# Patient Record
Sex: Female | Born: 1965 | Race: White | Hispanic: No | Marital: Married | Smoking: Current every day smoker
Health system: Southern US, Community
[De-identification: ages and names within clinical notes are randomized; demographics above are authoritative.]

## PROBLEM LIST (undated history)

## (undated) DIAGNOSIS — K760 Fatty (change of) liver, not elsewhere classified: Secondary | ICD-10-CM

## (undated) DIAGNOSIS — F32A Depression, unspecified: Secondary | ICD-10-CM

## (undated) DIAGNOSIS — E785 Hyperlipidemia, unspecified: Secondary | ICD-10-CM

## (undated) DIAGNOSIS — K219 Gastro-esophageal reflux disease without esophagitis: Secondary | ICD-10-CM

## (undated) DIAGNOSIS — I82629 Acute embolism and thrombosis of deep veins of unspecified upper extremity: Secondary | ICD-10-CM

## (undated) DIAGNOSIS — I10 Essential (primary) hypertension: Secondary | ICD-10-CM

## (undated) DIAGNOSIS — K8689 Other specified diseases of pancreas: Secondary | ICD-10-CM

## (undated) DIAGNOSIS — R32 Unspecified urinary incontinence: Secondary | ICD-10-CM

## (undated) DIAGNOSIS — M19049 Primary osteoarthritis, unspecified hand: Secondary | ICD-10-CM

## (undated) DIAGNOSIS — A630 Anogenital (venereal) warts: Secondary | ICD-10-CM

## (undated) DIAGNOSIS — Z973 Presence of spectacles and contact lenses: Secondary | ICD-10-CM

## (undated) DIAGNOSIS — F419 Anxiety disorder, unspecified: Secondary | ICD-10-CM

## (undated) DIAGNOSIS — Z01419 Encounter for gynecological examination (general) (routine) without abnormal findings: Secondary | ICD-10-CM

## (undated) DIAGNOSIS — C801 Malignant (primary) neoplasm, unspecified: Secondary | ICD-10-CM

## (undated) DIAGNOSIS — K861 Other chronic pancreatitis: Secondary | ICD-10-CM

## (undated) DIAGNOSIS — F329 Major depressive disorder, single episode, unspecified: Secondary | ICD-10-CM

## (undated) HISTORY — DX: Anogenital (venereal) warts: A63.0

## (undated) HISTORY — DX: Primary osteoarthritis, unspecified hand: M19.049

## (undated) HISTORY — DX: Other specified diseases of pancreas: K86.89

## (undated) HISTORY — DX: Hyperlipidemia, unspecified: E78.5

## (undated) HISTORY — DX: Gastro-esophageal reflux disease without esophagitis: K21.9

## (undated) HISTORY — DX: Other chronic pancreatitis: K86.1

## (undated) HISTORY — DX: Unspecified urinary incontinence: R32

## (undated) HISTORY — DX: Fatty (change of) liver, not elsewhere classified: K76.0

## (undated) HISTORY — DX: Major depressive disorder, single episode, unspecified: F32.9

## (undated) HISTORY — DX: Anxiety disorder, unspecified: F41.9

## (undated) HISTORY — DX: Depression, unspecified: F32.A

## (undated) HISTORY — DX: Presence of spectacles and contact lenses: Z97.3

## (undated) HISTORY — DX: Encounter for gynecological examination (general) (routine) without abnormal findings: Z01.419

## (undated) HISTORY — DX: Acute embolism and thrombosis of deep veins of unspecified upper extremity: I82.629

## (undated) HISTORY — DX: Essential (primary) hypertension: I10

## (undated) HISTORY — DX: Malignant (primary) neoplasm, unspecified: C80.1

## (undated) HISTORY — PX: UPPER GASTROINTESTINAL ENDOSCOPY: SHX188

---
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
## 1985-05-14 HISTORY — PX: ABDOMINAL HYSTERECTOMY: SHX81

## 1998-05-14 HISTORY — PX: CHOLECYSTECTOMY: SHX55

## 2008-07-02 ENCOUNTER — Encounter: Admission: RE | Admit: 2008-07-02 | Discharge: 2008-07-02 | Payer: Self-pay | Admitting: Emergency Medicine

## 2008-07-08 ENCOUNTER — Encounter: Admission: RE | Admit: 2008-07-08 | Discharge: 2008-07-08 | Payer: Self-pay | Admitting: Emergency Medicine

## 2009-04-22 ENCOUNTER — Emergency Department (HOSPITAL_COMMUNITY): Admission: EM | Admit: 2009-04-22 | Discharge: 2009-04-22 | Payer: Self-pay | Admitting: Emergency Medicine

## 2009-05-12 HISTORY — PX: UPPER ENDOSCOPIC ULTRASOUND W/ FNA: SHX2601

## 2009-05-14 DIAGNOSIS — K8689 Other specified diseases of pancreas: Secondary | ICD-10-CM

## 2009-05-14 DIAGNOSIS — I82629 Acute embolism and thrombosis of deep veins of unspecified upper extremity: Secondary | ICD-10-CM

## 2009-05-14 HISTORY — PX: PANCREATICODUODENECTOMY: SUR1000

## 2009-05-14 HISTORY — DX: Acute embolism and thrombosis of deep veins of unspecified upper extremity: I82.629

## 2009-05-14 HISTORY — DX: Other specified diseases of pancreas: K86.89

## 2009-06-10 HISTORY — PX: PANCREATICODUODENECTOMY: SUR1000

## 2009-07-04 HISTORY — PX: OTHER SURGICAL HISTORY: SHX169

## 2009-11-25 ENCOUNTER — Other Ambulatory Visit: Admission: RE | Admit: 2009-11-25 | Discharge: 2009-11-25 | Payer: Self-pay | Admitting: Obstetrics and Gynecology

## 2010-06-04 ENCOUNTER — Encounter: Payer: Self-pay | Admitting: Emergency Medicine

## 2010-06-26 ENCOUNTER — Ambulatory Visit: Payer: Self-pay | Admitting: Internal Medicine

## 2010-08-15 LAB — COMPREHENSIVE METABOLIC PANEL
Albumin: 4.1 g/dL (ref 3.5–5.2)
BUN: 6 mg/dL (ref 6–23)
Chloride: 103 mEq/L (ref 96–112)
Creatinine, Ser: 0.81 mg/dL (ref 0.4–1.2)
Glucose, Bld: 86 mg/dL (ref 70–99)
Total Bilirubin: 0.7 mg/dL (ref 0.3–1.2)

## 2010-08-15 LAB — DIFFERENTIAL
Basophils Absolute: 0 10*3/uL (ref 0.0–0.1)
Basophils Relative: 0 % (ref 0–1)
Monocytes Relative: 6 % (ref 3–12)
Neutro Abs: 7.2 10*3/uL (ref 1.7–7.7)
Neutrophils Relative %: 64 % (ref 43–77)

## 2010-08-15 LAB — CBC
HCT: 42.3 % (ref 36.0–46.0)
Hemoglobin: 14.1 g/dL (ref 12.0–15.0)
MCHC: 33.2 g/dL (ref 30.0–36.0)
RBC: 4.73 MIL/uL (ref 3.87–5.11)
RDW: 13.8 % (ref 11.5–15.5)

## 2010-08-15 LAB — URINALYSIS, ROUTINE W REFLEX MICROSCOPIC
Bilirubin Urine: NEGATIVE
Glucose, UA: NEGATIVE mg/dL
Ketones, ur: NEGATIVE mg/dL
pH: 6.5 (ref 5.0–8.0)

## 2010-08-15 LAB — LIPASE, BLOOD: Lipase: 24 U/L (ref 11–59)

## 2010-10-05 ENCOUNTER — Encounter: Payer: Self-pay | Admitting: Medical

## 2010-10-05 ENCOUNTER — Ambulatory Visit (INDEPENDENT_AMBULATORY_CARE_PROVIDER_SITE_OTHER): Payer: BC Managed Care – PPO | Admitting: Medical

## 2010-10-05 VITALS — BP 130/84 | HR 60 | Temp 98.2°F | Ht 65.5 in | Wt 144.0 lb

## 2010-10-05 DIAGNOSIS — K861 Other chronic pancreatitis: Secondary | ICD-10-CM

## 2010-10-05 DIAGNOSIS — E119 Type 2 diabetes mellitus without complications: Secondary | ICD-10-CM

## 2010-10-05 DIAGNOSIS — R110 Nausea: Secondary | ICD-10-CM

## 2010-10-05 DIAGNOSIS — R197 Diarrhea, unspecified: Secondary | ICD-10-CM

## 2010-10-05 DIAGNOSIS — R109 Unspecified abdominal pain: Secondary | ICD-10-CM

## 2010-10-05 NOTE — Progress Notes (Signed)
Subjective:   HPI  Sara [facility] is a 45 y.o. female who presents to establish care. She is a former patient of mine. She has a somewhat complicated history over the last year. January of last year she had a pancreatic mass, ultimately had Whipple surgery, and the mass was found to be benign. Subsequently she ended up having new diagnosis of diabetes. She was very determined to get things under control, has made lifestyle changes, and is not on any medication at this time for diabetes, hypertension, or hyperlipidemia.  About 3 weeks ago [facility] for severe abdominal pain, and was diagnosed with chronic pancreatitis. Her surgeon advised her to followup with her primary doctor to discuss management of chronic pancreatitis. She is here for this reason today. Lately she has been having nausea, abdominal bloating, diarrhea, worse after eating, and seemed to have symptoms both morning and evening. Lately she has been intolerant to milk products. Her stool has been loose in general, no mucous or blood. She is very healthy: Including steamed vegetables, chicken, salmon, oatmeal.  Avoids bread in general, fast food, junkfood.  Prior to having her Whipple surgery last year, she has seen a gastroenterologist in [place] for an upper endoscopy. Otherwise no recent followup with a gastroenterologist.    She would like her hemoglobin A1c checked today. She was checking her glucose somewhat regularly with normal readings, but has lost her glucometer. No other c/o.  The following portions of the patient's history were reviewed and updated as appropriate: allergies, current medications, past family history, past medical history, past social history, past surgical history and problem list.  Past Medical History  Diagnosis Date  . Hypertension     Previous medication, but now controlled with weight loss and lifestyle change  . Anxiety   . Diabetes mellitus 10/2008    Was  on medication briefly a diagnosis, however with lifestyle changes, blood sugar back to normal  . Hyperlipidemia     Diet-controlled, lifestyle controlled  . GERD (gastroesophageal reflux disease)   . Depression   . Pancreatic mass 1/ 2011    Whipple surgery; lesion found to be benign  . History of thrombosis 05/2009    History of venous embolism and thrombosis of upper extremity at PICC line     Review of Systems Constitutional: denies fever, chills, sweats, unexpected weight change, anorexia, fatigue ENT: no runny nose, ear pain, sore throat, hoarseness, sinus pain Cardiology: denies chest pain, palpitations, edema Respiratory: denies cough, shortness of breath, wheezing Gastroenterology: denies vomiting, constipation,  Hematology: denies bleeding or bruising problems Musculoskeletal: denies arthralgias, myalgias, joint swelling, back pain Urology: denies dysuria, difficulty urinating, hematuria, urinary frequency, urgency    Objective:   Physical Exam  General appearence: alert, no distress, WD/WN, white female HEENT: normocephalic, sclerae anicteric, PERRLA, EOMi, nares patent, no discharge or erythema, pharynx normal Oral cavity: MMM, no lesions Neck: supple, no lymphadenopathy, no thyromegaly, no masses Heart: RRR, normal S1, S2, no murmurs Lungs: CTA bilaterally, no wheezes, rhonchi, or rales Abdomen: Vertical surgical scar, +bs, soft, non tender, non distended, no masses, no hepatomegaly, no splenomegaly Back: non tender Musculoskeletal: nontender, no swelling, no obvious deformity Extremities: no edema, no cyanosis, no clubbing Pulses: 2+ symmetric, upper and lower extremities, normal cap refill   Assessment :    Encounter Diagnoses  Name Primary?  . Diarrhea Yes  . Nausea   . Abdominal pain   . Chronic pancreatitis   . Diabetes mellitus  Plan:    She is a former patient of mine, she developed a new diagnosis of diabetes shortly after recovering from  her Whipple surgery January 2011. She was very determined to beat this diagnosis, started exercising, eating very healthy, and losing weight. She is now maintaining her diet and exercise and lifestyle changes for almost a year, and is now diet controlled. She is no longer on medication for, nor does she have symptoms or criteria for hypertension, diabetes, or hyperlipidemia of recent.  Discussed the nature of chronic pancreatitis, complications, symptoms, various treatments. Discussed diet. She will continue her multivitamins. She signed records release today so we can obtain prior records. Once we receive the records, will consider referral to gastroenterology for consult. For now avoid food triggers.

## 2010-10-12 ENCOUNTER — Other Ambulatory Visit (INDEPENDENT_AMBULATORY_CARE_PROVIDER_SITE_OTHER): Payer: BC Managed Care – PPO | Admitting: Medical

## 2010-10-12 ENCOUNTER — Telehealth: Payer: Self-pay | Admitting: *Deleted

## 2010-10-12 ENCOUNTER — Other Ambulatory Visit: Payer: BC Managed Care – PPO

## 2010-10-12 ENCOUNTER — Other Ambulatory Visit: Payer: Self-pay | Admitting: Medical

## 2010-10-12 ENCOUNTER — Encounter: Payer: Self-pay | Admitting: Gastroenterology

## 2010-10-12 DIAGNOSIS — R197 Diarrhea, unspecified: Secondary | ICD-10-CM

## 2010-10-12 DIAGNOSIS — N390 Urinary tract infection, site not specified: Secondary | ICD-10-CM

## 2010-10-12 DIAGNOSIS — K861 Other chronic pancreatitis: Secondary | ICD-10-CM

## 2010-10-12 DIAGNOSIS — R110 Nausea: Secondary | ICD-10-CM

## 2010-10-12 LAB — CBC WITH DIFFERENTIAL/PLATELET
Basophils Relative: 1 % (ref 0–1)
Eosinophils Absolute: 0.3 10*3/uL (ref 0.0–0.7)
HCT: 41.1 % (ref 36.0–46.0)
Hemoglobin: 13.2 g/dL (ref 12.0–15.0)
Lymphs Abs: 2.8 10*3/uL (ref 0.7–4.0)
MCH: 29.3 pg (ref 26.0–34.0)
MCHC: 32.1 g/dL (ref 30.0–36.0)
Monocytes Absolute: 0.5 10*3/uL (ref 0.1–1.0)
Monocytes Relative: 6 % (ref 3–12)
Neutro Abs: 4.1 10*3/uL (ref 1.7–7.7)
Neutrophils Relative %: 53 % (ref 43–77)
RBC: 4.51 MIL/uL (ref 3.87–5.11)

## 2010-10-12 LAB — POCT URINALYSIS DIPSTICK
Leukocytes, UA: NEGATIVE
Nitrite, UA: NEGATIVE
Protein, UA: NEGATIVE
Urobilinogen, UA: NEGATIVE
pH, UA: 7

## 2010-10-12 LAB — COMPREHENSIVE METABOLIC PANEL
ALT: 20 U/L (ref 0–35)
Alkaline Phosphatase: 56 U/L (ref 39–117)
Creat: 0.74 mg/dL (ref 0.40–1.20)
Sodium: 138 mEq/L (ref 135–145)
Total Bilirubin: 0.6 mg/dL (ref 0.3–1.2)
Total Protein: 6.6 g/dL (ref 6.0–8.3)

## 2010-10-12 LAB — VITAMIN B12: Vitamin B-12: 611 pg/mL (ref 211–911)

## 2010-10-12 LAB — VITAMIN D 1,25 DIHYDROXY

## 2010-10-12 NOTE — Telephone Encounter (Addendum)
Message copied by Dorthula Perfect on Thu Oct 12, 2010  3:00 PM ------      Message from: Aleen Campi, DAVID S      Created: Thu Oct 12, 2010  8:21 AM       pls call pt and advise that I have thought about her symptoms, and i have received some of the prior records.  Please have her come in at her convenience for some additional labs, and c/t with plan to refer to GI.  Send OV, lab results, and pull chart so I can look at some other prior records.              GI referral - see me     Called pt and scheduled a nurse visit for today for additional labs to be done.  Pt agreed to GI referral and scheduled and appointment on 10-16-10 at 2:45pm.  Will fax office notes and labs to Dr. Christella Hartigan for appointment.  CM,LPN

## 2010-10-14 LAB — VITAMIN D 25 HYDROXY (VIT D DEFICIENCY, FRACTURES): Vit D, 25-Hydroxy: 41 ng/mL (ref 30–89)

## 2010-10-16 ENCOUNTER — Telehealth: Payer: Self-pay | Admitting: *Deleted

## 2010-10-16 ENCOUNTER — Encounter: Payer: Self-pay | Admitting: Internal Medicine

## 2010-10-16 ENCOUNTER — Ambulatory Visit (INDEPENDENT_AMBULATORY_CARE_PROVIDER_SITE_OTHER): Payer: BC Managed Care – PPO | Admitting: Internal Medicine

## 2010-10-16 VITALS — BP 108/70 | HR 76 | Ht 65.5 in | Wt 148.6 lb

## 2010-10-16 DIAGNOSIS — R1013 Epigastric pain: Secondary | ICD-10-CM | POA: Insufficient documentation

## 2010-10-16 DIAGNOSIS — K8689 Other specified diseases of pancreas: Secondary | ICD-10-CM

## 2010-10-16 DIAGNOSIS — K861 Other chronic pancreatitis: Secondary | ICD-10-CM | POA: Insufficient documentation

## 2010-10-16 HISTORY — DX: Other specified diseases of pancreas: K86.89

## 2010-10-16 MED ORDER — ZENPEP 25000 UNITS PO CPEP: 1.0000 | ORAL_CAPSULE | Freq: Three times a day (TID) | ORAL | Status: DC

## 2010-10-16 NOTE — Progress Notes (Signed)
  Subjective:    Patient ID: [facility], female    DOB: 12-26-1965, 45 y.o.   MRN: [id number]  HPI 45 year old white woman here for evaluation of abdominal pain and diarrhea, she is status post pancreaticoduodenectomy on 06/13/2009. She had an evaluation by another gastroenterologist in [place], was found to have a subtle pancreatic mass on imaging including CT, MRI and endoscopic ultrasound.  Pathology reportedly ended up showing that the mass was a benign lesion though I do not have the exact pathology reports I know that from chart review from her surgeon's notes. She says it took about 6 months to recover from her surgery, it was complicated by nausea and vomiting and some abdominal pain, she lost 68 pounds by intent she believes, and has been able to only medications for a new diagnosis of diabetes mellitus. She had upper extremity thromboses related to TPN and intravenous catheter.  Now she is describing months of multiple, foul smelling tan stools a day that tend to be loose. She has not noted oil droplets. There is significat borborygmi and excessive flatulence. Dairy products are noted to be triggers. She has been very careful about fat ingestion and eats little to none, she claims.  She also had recurrent epigastric pain. She recently returned to her surgeon and had an ultrasound of the abdomen that demonstrated atrophic residual pancreatic parenchyma. Her surgeon hass thought she has chronic pancreatitis. There was no pre-operative diagnosis of this and she has not been a drinker. The epigastric pain can be debilitating at times though it is intermittent and unpredictable and mostly not severe. Her stools tend to be postprandial, occurring within a couple of hours after eating. They do not tend to be nocturnal. She also has nausea, I don't that she's had much vomiting. She had some similar symptoms, without the diarrhea or stool changes prior to her surgery.  Remainder of GI review of  systems negative.         Review of Systems Positive for anxiety, some depressive symptoms currently under treatment, fatigue and those things mentioned above. All other systems are negative    Objective:   Physical Exam  Constitutional: She is oriented to person, place, and time. She appears well-developed and well-nourished. No distress.  HENT:  Head: Normocephalic and atraumatic.  Mouth/Throat: Oropharynx is clear and moist. No oropharyngeal exudate.  Eyes: Conjunctivae are normal. Pupils are equal, round, and reactive to light. No scleral icterus.  Neck: Normal range of motion. Neck supple. No thyromegaly present.       No cervical or supraclavicular adenopathy  Cardiovascular: Normal rate, regular rhythm and normal heart sounds.  Exam reveals no friction rub.   No murmur heard. Pulmonary/Chest: Effort normal and breath sounds normal. She has no rales.  Abdominal: Soft. Bowel sounds are normal. She exhibits no distension and no mass. There is no tenderness.       Midline scar, vertical, no hepatosplenomegaly detected. No hernia. Bowel sounds present.  Musculoskeletal: She exhibits no edema.  Neurological: She is alert and oriented to person, place, and time.  Skin: Skin is warm and dry.       Tan with numerous tattoos.  Psychiatric: She has a normal mood and affect.          Assessment & Plan:

## 2010-10-16 NOTE — Assessment & Plan Note (Signed)
The history is very suggestive that this is her problem after her pancreaticoduodenectomy. This is very common. She raised concern about possibly gaining weight on pancreatic enzyme supplements but I explained this was a necessary therapeutic trial. She will see me back in about 6-8 weeks for reassessment. Her vitamin D deficiency and other fat-soluble vitamins chronically deficient due to this and should recover with improved fat absorption. Depending upon the response or possible lack of , further workup would be indicated but none at this time. I do want to pathology of her resection specimen.

## 2010-10-16 NOTE — Patient Instructions (Signed)
You may pick up your prescription for you Zenpep at you pharmacy. Return to see Dr. Leone Payor in about 6 weeks.

## 2010-10-16 NOTE — Assessment & Plan Note (Addendum)
The current thinking maybe this is related to her pancreas problems. Question if there is a postoperative issue or other problem. She did have pain prior to her surgery though at the time I think it was attributed to this mass seen in the pancreas. We'll see if this response to pancreatic enzyme supplements. If it does not, titrating upward on the pancreatic enzymes may be necessary versus possible other imaging or endoscopy.  She has had epigastric pain going back to 2005 at least as records show EGD and CT scan for this. ? If that was the pancreatitis problems or another issue or both and what is the cause now.

## 2010-10-16 NOTE — Assessment & Plan Note (Addendum)
This was suspected by Dr. Marilynn Rail, after her pancreaticoduodenectomy. I don't get a sense that there was any history previous, causing this though I suspect she may have had chronic pancreatitis changes in the uncinate process or head of the pancreas that led to her surgery. I do not have the actual path all the reports from the EUS in the pancreaticoduodenectomy and will last for those to be reviewed. One of Dr. 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 She did have some epigastric pain problems prior to the surgery, and I wonder if it's possible that there was another problem causing the pain and it was not related to the mass in the pancreas. If she does have chronic pancreatitis, etiology not clear she's not been a chronic drinker. Recent imaging has been ultrasound and it has not shown any particular problems. It is rare but possible to have stenosis of the small bowel pancreatic anastomosis after this type of surgery and will keep that in mind taking the overall course. I am going to treat her with pancreatic enzyme supplementation which I think will help her pancreatic insufficiency and hopefully help her pain.  She may need repeat EUS or other cross-sectional imaging depending upon clinical course.

## 2010-10-16 NOTE — Telephone Encounter (Addendum)
Message copied by Dorthula Perfect on Mon Oct 16, 2010 12:19 PM ------      Message from: Jac Canavan      Created: Mon Oct 16, 2010 11:47 AM       Vit D is on the low side of normal.    I would recommend she take OTC Vit D 400 mg daily.   Otherwise, her liver, kidney, lytes, pancreas labs normal.  We have her records ready to take with her to her GI appt today.    Pt notified of lab results.  Recommended Vit D otc 400 mg daily.  Records are ready to be picked up by pt for GI appointment.  CM, LPN

## 2010-10-25 ENCOUNTER — Encounter: Payer: Self-pay | Admitting: Internal Medicine

## 2010-11-27 ENCOUNTER — Ambulatory Visit: Payer: BC Managed Care – PPO | Admitting: Internal Medicine

## 2010-11-29 ENCOUNTER — Other Ambulatory Visit: Payer: Self-pay | Admitting: Medical

## 2010-11-29 MED ORDER — ALPRAZOLAM 1 MG PO TABS: 1.0000 mg | ORAL_TABLET | Freq: Every evening | ORAL | Status: DC | PRN

## 2010-11-29 MED ORDER — VENLAFAXINE HCL ER 150 MG PO CP24: 150.0000 mg | ORAL_CAPSULE | Freq: Every day | ORAL | Status: DC

## 2010-12-08 ENCOUNTER — Encounter: Payer: Self-pay | Admitting: Family Medicine

## 2010-12-11 ENCOUNTER — Other Ambulatory Visit: Payer: Self-pay | Admitting: *Deleted

## 2010-12-11 MED ORDER — RISPERIDONE 1 MG PO TABS: 1.0000 mg | ORAL_TABLET | Freq: Every day | ORAL | Status: DC

## 2010-12-14 ENCOUNTER — Other Ambulatory Visit: Payer: Self-pay | Admitting: Medical

## 2010-12-14 MED ORDER — RISPERIDONE 1 MG PO TABS: 1.0000 mg | ORAL_TABLET | Freq: Every day | ORAL | Status: DC

## 2011-01-10 ENCOUNTER — Telehealth: Payer: Self-pay | Admitting: Medical

## 2011-01-10 NOTE — Telephone Encounter (Signed)
PT CALLED AND STATED [facility] WOULD[facility]T NOT REFILL RX OUR RECORDS INDICATE THAT ACTIVE REFILLS WHERE AVAILABLE. i CALLED [facility] [address] [phone] AND SPOKE TO PHARMACIST AND REFILLED PER INSTRUCTIONS  RISPERDAL 1 MG TAKE 1 BY MOUTH DAILY #[phone] REFILLS

## 2011-01-26 ENCOUNTER — Institutional Professional Consult (permissible substitution): Payer: BC Managed Care – PPO | Admitting: Medical

## 2011-01-29 ENCOUNTER — Encounter: Payer: Self-pay | Admitting: Medical

## 2011-01-29 ENCOUNTER — Telehealth: Payer: Self-pay | Admitting: Medical

## 2011-01-29 ENCOUNTER — Ambulatory Visit (INDEPENDENT_AMBULATORY_CARE_PROVIDER_SITE_OTHER): Payer: BC Managed Care – PPO | Admitting: Medical

## 2011-01-29 VITALS — Ht 65.5 in | Wt 144.0 lb

## 2011-01-29 DIAGNOSIS — F419 Anxiety disorder, unspecified: Secondary | ICD-10-CM

## 2011-01-29 DIAGNOSIS — F411 Generalized anxiety disorder: Secondary | ICD-10-CM

## 2011-01-29 DIAGNOSIS — Z7189 Other specified counseling: Secondary | ICD-10-CM

## 2011-01-29 NOTE — Telephone Encounter (Signed)
Wrote letter for pt.

## 2011-01-29 NOTE — Telephone Encounter (Signed)
Please send letter.

## 2011-01-29 NOTE — Progress Notes (Signed)
  Subjective:   HPI [facility] is a 45 y.o. female who presents for recheck.  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  I saw her daughter as a patient prior in [place].  Sara Leon is moving soon and the housing facility requires any adult children living with parents to demonstrate medical of specific social or psychological need to reside there.  Needs a letter stating that her daughter is under her care and must live with her.   In general she is doing well, no c/o.  On her usual medication without problem.  Otherwise has been in her normal state of health.   I saw her husband [name] recently who went for his cardiology appt, but didn't show up for the colonoscopy appointment.  He felt that since heart checked out fine, he didn't need to f/u with colonoscopy.  He apparently is on Viagra and would to renew his script through [place].  No other c/o.  The following portions of the patient's history were reviewed and updated as appropriate: allergies, current medications, past family history, past medical history, past social history, past surgical history and problem list.  Past Medical History  Diagnosis Date  . Hypertension     Previous medication, but now controlled with weight loss and lifestyle change  . Anxiety   . Diabetes mellitus 10/2008    Was on medication briefly a diagnosis, however with lifestyle changes, blood sugar back to normal  . Hyperlipidemia     Diet-controlled, lifestyle controlled  . GERD (gastroesophageal reflux disease)   . Depression   . 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 Pancreatitis chronic     on resection specimen  . Deep venous thrombosis of upper extremity 2011    due to PICC  . Gallstones   . Pancreatic insufficiency 10/16/2010  . Fatty liver     appears improved on imaging after weight loss    Review of Systems Negative.     Objective:   Physical Exam  General appearance: alert, no distress, WD/WN   Assessment :    Encounter Diagnoses  Name Primary?  Marland Kitchen Anxiety Yes  . Counseling on health promotion and disease prevention      Plan:   Anxiety - well controlled with current medication and nonpharmacologic, self awareness techniques and exercise.    Complete her form regarding her daughter's care, living with her.

## 2011-03-08 ENCOUNTER — Telehealth: Payer: Self-pay | Admitting: Medical

## 2011-03-08 NOTE — Telephone Encounter (Signed)
Renew her  Effexor and Xanax. Make a note to check with Vincenza Hews concerning when he wants to see her again.

## 2011-03-08 NOTE — Telephone Encounter (Signed)
Called in meds Effexor and xanax for pt. 2 refills each. Will ask shane when he wants her to come in again for an appt.

## 2011-03-09 ENCOUNTER — Telehealth: Payer: Self-pay | Admitting: Internal Medicine

## 2011-03-09 NOTE — Telephone Encounter (Signed)
In general, pls ask me about refills on controlled substances such as Xanax, pain meds, etc.  In this case its ok.  I know her well and she is fine to have refills, but any controlled substances we have to watch closely.  I want to see her in follow up in 68mo before next refill.  If its been more than a year since last physical, we can make this a physical visit.

## 2011-03-09 NOTE — Telephone Encounter (Signed)
Pt knows that her meds were refilled and to come in for 80mo follow-up to get anymore refills.

## 2011-04-03 ENCOUNTER — Other Ambulatory Visit: Payer: Self-pay | Admitting: Obstetrics and Gynecology

## 2011-04-03 DIAGNOSIS — Z1231 Encounter for screening mammogram for malignant neoplasm of breast: Secondary | ICD-10-CM

## 2011-05-02 ENCOUNTER — Ambulatory Visit: Payer: BC Managed Care – PPO

## 2011-05-02 ENCOUNTER — Encounter: Payer: Self-pay | Admitting: Medical

## 2011-05-02 ENCOUNTER — Ambulatory Visit (INDEPENDENT_AMBULATORY_CARE_PROVIDER_SITE_OTHER): Payer: BC Managed Care – PPO | Admitting: Medical

## 2011-05-02 VITALS — BP 130/80 | HR 62 | Temp 98.3°F | Resp 16 | Wt 167.0 lb

## 2011-05-02 DIAGNOSIS — E785 Hyperlipidemia, unspecified: Secondary | ICD-10-CM

## 2011-05-02 DIAGNOSIS — F341 Dysthymic disorder: Secondary | ICD-10-CM

## 2011-05-02 DIAGNOSIS — K8689 Other specified diseases of pancreas: Secondary | ICD-10-CM

## 2011-05-02 DIAGNOSIS — E119 Type 2 diabetes mellitus without complications: Secondary | ICD-10-CM

## 2011-05-02 DIAGNOSIS — F418 Other specified anxiety disorders: Secondary | ICD-10-CM | POA: Insufficient documentation

## 2011-05-02 LAB — CBC WITH DIFFERENTIAL/PLATELET
Basophils Relative: 2 % — ABNORMAL HIGH (ref 0–1)
Hemoglobin: 15.1 g/dL — ABNORMAL HIGH (ref 12.0–15.0)
Lymphocytes Relative: 31 % (ref 12–46)
Lymphs Abs: 2 10*3/uL (ref 0.7–4.0)
MCHC: 33 g/dL (ref 30.0–36.0)
Monocytes Relative: 5 % (ref 3–12)
Neutro Abs: 3.6 10*3/uL (ref 1.7–7.7)
Neutrophils Relative %: 56 % (ref 43–77)
RBC: 5.03 MIL/uL (ref 3.87–5.11)
WBC: 6.4 10*3/uL (ref 4.0–10.5)

## 2011-05-02 LAB — COMPREHENSIVE METABOLIC PANEL
Albumin: 4.3 g/dL (ref 3.5–5.2)
BUN: 12 mg/dL (ref 6–23)
CO2: 22 mEq/L (ref 19–32)
Calcium: 9.3 mg/dL (ref 8.4–10.5)
Chloride: 104 mEq/L (ref 96–112)
Glucose, Bld: 91 mg/dL (ref 70–99)
Potassium: 4.3 mEq/L (ref 3.5–5.3)

## 2011-05-02 LAB — LIPID PANEL
Cholesterol: 205 mg/dL — ABNORMAL HIGH (ref 0–200)
HDL: 39 mg/dL — ABNORMAL LOW (ref >39)
Total CHOL/HDL Ratio: 5.3 Ratio

## 2011-05-02 NOTE — Progress Notes (Signed)
Subjective:   HPI  Sara Leon is a 45 y.o. female who presents for routine f/u.  Since last visit she saw Dr. Sharmon Leyden at [facility], had normal pap smear and mammogram scheduled for 05/20/10.  I sent her to GI in June, she saw Dr. Leone Payor, but was unhappy with the visit.  Felt like there was pressure to take a new expensive enzyme for her belly pain and nausea.  Since then she began herbal enzymes which have resolved her problems.   She is here for fasting labs, will be due for medication refill in the next month.  No other aggravating or relieving factors.    No other c/o.  The following portions of the patient's history were reviewed and updated as appropriate: allergies, current medications, past family history, past medical history, past social history, past surgical history and problem list.  Past Medical History  Diagnosis Date  . Hypertension     Previous medication, but now controlled with weight loss and lifestyle change  . Anxiety   . Diabetes mellitus 10/2008    Was on medication briefly a diagnosis, however with lifestyle changes, blood sugar back to normal  . Hyperlipidemia     Diet-controlled, lifestyle controlled  . GERD (gastroesophageal reflux disease)   . Depression   . 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 Pancreatitis chronic     on resection specimen  . Deep venous thrombosis of upper extremity 2011    due to PICC  . Gallstones   . Pancreatic insufficiency 10/16/2010  . Fatty liver     appears improved on imaging after weight loss   Review of Systems Constitutional: -fever, -chills, -sweats, -unexpected -weight change,-fatigue ENT: -runny nose, -ear pain, -sore throat Cardiology:  -chest pain, -palpitations, -edema Respiratory: -cough, -shortness of breath, -wheezing Gastroenterology: -abdominal pain, -nausea, -vomiting, -diarrhea, -constipation Hematology: -bleeding or bruising problems Musculoskeletal:  -arthralgias, -myalgias, -joint swelling, -back pain Ophthalmology: -vision changes Urology: -dysuria, -difficulty urinating, -hematuria, -urinary frequency, -urgency Neurology: -headache, -weakness, -tingling, -numbness     Objective:   Physical Exam  Filed Vitals:   05/02/11 0927  BP: 130/80  Pulse: 62  Temp: 98.3 F (36.8 C)  Resp: 16    General appearance: alert, no distress, WD/WN  HEENT: normocephalic, sclerae anicteric, TMs pearly, nares patent, no discharge or erythema, pharynx normal Oral cavity: MMM, no lesions Neck: supple, no lymphadenopathy, no thyromegaly, no masses Heart: RRR, normal S1, S2, no murmurs Lungs: CTA bilaterally, no wheezes, rhonchi, or rales Abdomen: +bs, soft, vertical surgical scar, other small laparoscopic scars, non tender, non distended, no masses, no hepatomegaly, no splenomegaly Pulses: 2+ symmetric, upper and lower extremities, normal cap refill Neuro: CN2-12 intact, non focal   Assessment and Plan :    Encounter Diagnoses  Name Primary?  . Type II or unspecified type diabetes mellitus without mention of complication, not stated as uncontrolled Yes  . Hyperlipidemia   . Pancreatic insufficiency   . Depression with anxiety    In general, she has done well this past year with diet and execise.  She will address safety/lighting at her gym and get back into her routine.  Otherwise has been doing well, eating healthy, exercise.  Controlled on current medications.   Diabetes type II - has been diet controlled, labs today  Hyperlipidemia - labs today, diet controlled  Pancreatic insufficiency - c/t OTC herbal enzymes  Depression with anxiety - c/t current medication, labs today for safety/monitoring  Follow-up pending labs

## 2011-05-11 ENCOUNTER — Other Ambulatory Visit: Payer: Self-pay | Admitting: Internal Medicine

## 2011-05-11 ENCOUNTER — Other Ambulatory Visit: Payer: Self-pay | Admitting: Medical

## 2011-05-11 MED ORDER — PRAVASTATIN SODIUM 40 MG PO TABS: 40.0000 mg | ORAL_TABLET | Freq: Every day | ORAL | Status: DC

## 2011-05-11 MED ORDER — RISPERIDONE 1 MG PO TABS: 1.0000 mg | ORAL_TABLET | Freq: Every day | ORAL | Status: DC

## 2011-05-11 NOTE — Telephone Encounter (Signed)
PATIENT WAS NOTIFIED ABOUT ADDING A MEDICATION TO HELP WITH HER CHOLESTROL. PATIENT HAS AGREED TO ADDING THIS MEDICATION. CAN YOU PLEASE SEND THIS IN TO THE PHARMACY. CLS

## 2011-05-11 NOTE — Telephone Encounter (Signed)
Message copied by Janeice Robinson on Fri May 11, 2011 11:15 AM ------      Message from: Jac Canavan      Created: Thu May 10, 2011  6:14 AM       Her triglycerides were over 400 and her total cholesterol is up.  Otherwise her labs were fine.  I certainly want her back exercising like she was, but we should probably add a medication such as pravastatin to help lower the fats and cholesterol to reduce risk of complications from high triglycerides.              If agreeable to the medication let me know.  Lets plan to recheck the cholesterol and triglyceride labs again in 4 mo.

## 2011-05-21 ENCOUNTER — Inpatient Hospital Stay: Admission: RE | Admit: 2011-05-21 | Payer: BC Managed Care – PPO | Source: Ambulatory Visit

## 2011-05-23 ENCOUNTER — Encounter: Payer: Self-pay | Admitting: Internal Medicine

## 2011-05-25 ENCOUNTER — Ambulatory Visit (INDEPENDENT_AMBULATORY_CARE_PROVIDER_SITE_OTHER): Payer: BC Managed Care – PPO | Admitting: Medical

## 2011-05-25 ENCOUNTER — Encounter: Payer: Self-pay | Admitting: Medical

## 2011-05-25 VITALS — BP 140/82 | HR 78 | Temp 98.3°F | Resp 16 | Wt 165.0 lb

## 2011-05-25 DIAGNOSIS — F32A Depression, unspecified: Secondary | ICD-10-CM | POA: Insufficient documentation

## 2011-05-25 DIAGNOSIS — F411 Generalized anxiety disorder: Secondary | ICD-10-CM

## 2011-05-25 DIAGNOSIS — F419 Anxiety disorder, unspecified: Secondary | ICD-10-CM

## 2011-05-25 DIAGNOSIS — F329 Major depressive disorder, single episode, unspecified: Secondary | ICD-10-CM

## 2011-05-25 MED ORDER — VENLAFAXINE HCL ER 75 MG PO CP24: 75.0000 mg | ORAL_CAPSULE | Freq: Every day | ORAL | Status: DC

## 2011-05-25 NOTE — Progress Notes (Signed)
  Subjective:   HPI  Sara Leon is a 46 y.o. female who presents for recheck on anxiety and depression.  In the past she has been on Risperidone 1 mg daily at bedtime, she has been on Effexor 300 mg XR prior, but lately her regimen has included Risperidone 1 mg daily and 150 mg of XR Effexor daily.  She uses 1-2 Xanax nightly.  Recently there has been significant more stress in her life, and she would like to increase her Effexor back to 300 mg daily.  She currently has increased financial stress.  She and family rent an apartment in [place] so that their handicapped daughter to be close to work and resources. At the same time they have been maintaining a house payment on their former home in [place] as well as their other home in [place]. They're currently being for close up on on their [place] house.  She also notes that she has been in her current job for 4 years, but the main person she go to for concerns or problems just quit.  She is having her work stress but feels like there is no one she can turn to as far as issues.  She feels as though she is on the verge of panic attacks like she had prior.  In the past when she had panic episodes she may get chest discomfort, a warm feeling coming over her, increased breathing, and a choking feeling.  She was originally put on psychiatric medicines by psychiatrist while living in [place].  No other aggravating or relieving factors.    No other c/o.  The following portions of the patient's history were reviewed and updated as appropriate: allergies, current medications, past family history, past medical history, past social history, past surgical history and problem list.  Past Medical History  Diagnosis Date  . Hypertension     Previous medication, but now controlled with weight loss and lifestyle change  . Anxiety   . Diabetes mellitus 10/2008    Was on medication briefly a diagnosis, however with lifestyle changes, blood sugar back to normal   . Hyperlipidemia     Diet-controlled, lifestyle controlled  . GERD (gastroesophageal reflux disease)   . Depression   . 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 Pancreatitis chronic     on resection specimen  . Deep venous thrombosis of upper extremity 2011    due to PICC  . Gallstones   . Pancreatic insufficiency 10/16/2010  . Fatty liver     appears improved on imaging after weight loss     Objective:   Physical Exam  Filed Vitals:   05/25/11 1523  BP: 140/82  Pulse: 78  Temp: 98.3 F (36.8 C)  Resp: 16    General appearance: alert, no distress, WD/WN Psych: somewhat anxious appearing, decreased affect compared to normal  Assessment and Plan :    Encounter Diagnoses  Name Primary?  Marland Kitchen Anxiety Yes  . Depression    Discussed her concerns, discussed counseling, increased Effexor by adding 75mg  IR to her current 150mg  XR dose, c/t Risperidone 1mg  QHS, Xanax 2 tablets QHS.  Follow-up with call report 1wk, recheck 4mo.

## 2011-05-28 ENCOUNTER — Encounter: Payer: Self-pay | Admitting: Medical

## 2011-05-28 ENCOUNTER — Ambulatory Visit (INDEPENDENT_AMBULATORY_CARE_PROVIDER_SITE_OTHER): Payer: BC Managed Care – PPO | Admitting: Medical

## 2011-05-28 VITALS — BP 130/80 | HR 68 | Temp 98.2°F

## 2011-05-28 DIAGNOSIS — N390 Urinary tract infection, site not specified: Secondary | ICD-10-CM

## 2011-05-28 LAB — POCT URINALYSIS DIPSTICK
Ketones, UA: NEGATIVE
Leukocytes, UA: NEGATIVE
Protein, UA: NEGATIVE

## 2011-05-28 MED ORDER — CIPROFLOXACIN HCL 500 MG PO TABS: 500.0000 mg | ORAL_TABLET | Freq: Two times a day (BID) | ORAL | Status: DC

## 2011-05-28 NOTE — Progress Notes (Signed)
Subjective:  Sara Leon is a 46 y.o. female who complains of burning with urination, dysuria, frequency, hematuria and suprapubic pressure. She has had symptoms for 2 days. Patient also complains of no other symptoms.. Patient denies back pain and fever. Patient does have a history of UTI in the past, but last UTI over 1.5 years ago. Patient does not have a history of pyelonephritis.   She is taking Azo OTC and began some Cipro 500mg  that her husband had left over.  Has taken 2 doses of this.   Objective:    Filed Vitals:   05/28/11 0908  BP: 130/80  Pulse: 68  Temp: 98.2 F (36.8 C)    General appearance: alert, no distress, WD/WN, female Abdomen: +bs, soft, mild suprapubic tenderness, non distended, no masses, no hepatomegaly, no splenomegaly, no bruits Back: no CVA tenderness    Laboratory:  Urine dipstick: + for nitrites and large blood, ph6.     Assessment and Plan:   Encounter Diagnosis  Name Primary?  . UTI (lower urinary tract infection) Yes      Script for Cipro, rest, hydrate well, note for work today, culture sent. Call/return if worse or not improving.

## 2011-05-28 NOTE — Patient Instructions (Signed)

## 2011-05-30 LAB — URINE CULTURE: Colony Count: NO GROWTH

## 2011-06-05 ENCOUNTER — Ambulatory Visit
Admission: RE | Admit: 2011-06-05 | Discharge: 2011-06-05 | Disposition: A | Discharge status: Home / Self Care | Payer: BC Managed Care – PPO | Source: Ambulatory Visit | Attending: Obstetrics and Gynecology | Admitting: Obstetrics and Gynecology

## 2011-06-05 DIAGNOSIS — Z1231 Encounter for screening mammogram for malignant neoplasm of breast: Secondary | ICD-10-CM

## 2011-06-12 ENCOUNTER — Encounter: Payer: Self-pay | Admitting: Medical

## 2011-06-12 ENCOUNTER — Ambulatory Visit (INDEPENDENT_AMBULATORY_CARE_PROVIDER_SITE_OTHER): Payer: BC Managed Care – PPO | Admitting: Medical

## 2011-06-12 VITALS — BP 130/80 | HR 72 | Temp 98.2°F | Resp 16 | Wt 168.0 lb

## 2011-06-12 DIAGNOSIS — F329 Major depressive disorder, single episode, unspecified: Secondary | ICD-10-CM

## 2011-06-12 DIAGNOSIS — F32A Depression, unspecified: Secondary | ICD-10-CM

## 2011-06-12 DIAGNOSIS — F3289 Other specified depressive episodes: Secondary | ICD-10-CM

## 2011-06-12 DIAGNOSIS — J029 Acute pharyngitis, unspecified: Secondary | ICD-10-CM | POA: Insufficient documentation

## 2011-06-12 DIAGNOSIS — J069 Acute upper respiratory infection, unspecified: Secondary | ICD-10-CM

## 2011-06-12 NOTE — Progress Notes (Signed)
Subjective:   HPI  Sara Leon is a 46 y.o. female who presents for 1 day hx/o sore throat, nasal congestion, and now some chest congestion, headache and chills, dry cough, and runny nose.  Denies fever, sweats, but some chest tightness related to chest congestion.   No ear pain or sinus pain.  Using nothing for this.    She is also having a hard time dealing with the stresses we discussed recently.  We recently increased her Effexor, which is helping, but she is still dealing with the recent foreclosure issues.  She has to have papers turned in by the end of the week regarding foreclosure.  She spoke to a legal/financial counselor this week and has a plan in place.  She is handling this all on her own.  Thinks her Effexor still needs increasing, and has even been drinking some alcohol to help deal with the stress.  She feels like if she had a few days off work to sort through the papers, she could get things in order.   No other aggravating or relieving factors.    No other c/o.  The following portions of the patient's history were reviewed and updated as appropriate: allergies, current medications, past family history, past medical history, past social history, past surgical history and problem list.  Past Medical History  Diagnosis Date  . Hypertension     Previous medication, but now controlled with weight loss and lifestyle change  . Anxiety   . Diabetes mellitus 10/2008    Was on medication briefly a diagnosis, however with lifestyle changes, blood sugar back to normal  . Hyperlipidemia     Diet-controlled, lifestyle controlled  . GERD (gastroesophageal reflux disease)   . Depression   . 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 Pancreatitis chronic     on resection specimen  . Deep venous thrombosis of upper extremity 2011    due to PICC  . Gallstones   . Pancreatic insufficiency 10/16/2010  . Fatty liver     appears improved on  imaging after weight loss    No Known Allergies  Current Outpatient Prescriptions on File Prior to Visit  Medication Sig Dispense Refill  . ALPRAZolam (XANAX) 1 MG tablet Take 1 mg by mouth at bedtime as needed. 1/2 tablet lunch, 1 at bedtime       . pravastatin (PRAVACHOL) 40 MG tablet Take 1 tablet (40 mg total) by mouth daily.  30 tablet  3  . risperiDONE (RISPERDAL) 1 MG tablet Take 1 tablet (1 mg total) by mouth daily.  30 tablet  3  . venlafaxine (EFFEXOR XR) 75 MG 24 hr capsule Take 1 capsule (75 mg total) by mouth daily.  30 capsule  1  . venlafaxine (EFFEXOR-XR) 150 MG 24 hr capsule Take 1 capsule (150 mg total) by mouth daily.  30 capsule  2     Review of Systems Constitutional: denies fever, +chills, no sweat Cardiology: denies chest pain, palpitations, edema Gastroenterology: denies abdominal pain, nausea, vomiting, diarrhea  Urology: denies dysuria, difficulty urinating Neurology: weakness, tingling, numbness      Objective:   Physical Exam  General appearance: alert, no distress, WD/WN, ill appearing/fatigued appearing HEENT: normocephalic, sclerae anicteric, TMs pearly, nares with erythema and clear discharge, pharynx with mild erythema Oral cavity: MMM, no lesions Neck: supple, no lymphadenopathy, no thyromegaly, no masses Heart: RRR, normal S1, S2, no murmurs Lungs: CTA bilaterally, no wheezes, rhonchi, or rales  Psych: seems down and depressed   Assessment and Plan :     Encounter Diagnoses  Name Primary?  . Sore throat Yes  . URI (upper respiratory infection)   . Depression    URI/sore throat - seems viral, discussed supportive care, rest, and call/return if worse or not improving.   Gave note for work for a few days.  Depression - we discussed her stressors.  I primarily recommend she take a few days off work to get her finances in order, work through the financial forms she needs to complete due to foreclosure issue, and get some financial counseling.   I recommend we not changes her medication or increase as she is already on maximum dose of Effexor.   Consider counseling, discussed alcohol use and not using this as a means to cope.  Call report next week.

## 2011-06-13 ENCOUNTER — Telehealth: Payer: Self-pay | Admitting: Family Medicine

## 2011-06-13 ENCOUNTER — Other Ambulatory Visit: Payer: Self-pay | Admitting: Medical

## 2011-06-13 MED ORDER — VENLAFAXINE HCL ER 150 MG PO CP24: 150.0000 mg | ORAL_CAPSULE | Freq: Every day | ORAL | Status: DC

## 2011-06-15 NOTE — Telephone Encounter (Signed)
I spoke and i notified her that Vincenza Hews was going to send her RX refills on her Effexor. CLS

## 2011-07-12 ENCOUNTER — Other Ambulatory Visit: Payer: Self-pay | Admitting: Medical

## 2011-07-12 NOTE — Telephone Encounter (Signed)
Is this ok?

## 2011-08-13 ENCOUNTER — Telehealth: Payer: Self-pay | Admitting: Internal Medicine

## 2011-08-14 ENCOUNTER — Other Ambulatory Visit: Payer: Self-pay | Admitting: Medical

## 2011-08-14 MED ORDER — ALPRAZOLAM 1 MG PO TABS: 1.0000 mg | ORAL_TABLET | Freq: Every evening | ORAL | Status: DC | PRN

## 2011-08-14 NOTE — Progress Notes (Signed)
[facility] X[facility]ax per Kristian Covey PA-C. CLS

## 2011-08-15 NOTE — Telephone Encounter (Signed)
Done

## 2011-09-06 ENCOUNTER — Other Ambulatory Visit: Payer: Self-pay | Admitting: Medical

## 2011-09-06 NOTE — Telephone Encounter (Signed)
Is this ok?

## 2011-09-07 ENCOUNTER — Ambulatory Visit (INDEPENDENT_AMBULATORY_CARE_PROVIDER_SITE_OTHER): Payer: BC Managed Care – PPO | Admitting: Medical

## 2011-09-07 ENCOUNTER — Encounter: Payer: Self-pay | Admitting: Medical

## 2011-09-07 VITALS — BP 150/90 | HR 60 | Temp 98.2°F | Resp 16 | Wt 177.0 lb

## 2011-09-07 DIAGNOSIS — M79673 Pain in unspecified foot: Secondary | ICD-10-CM

## 2011-09-07 DIAGNOSIS — R7301 Impaired fasting glucose: Secondary | ICD-10-CM

## 2011-09-07 DIAGNOSIS — E785 Hyperlipidemia, unspecified: Secondary | ICD-10-CM

## 2011-09-07 DIAGNOSIS — Z79899 Other long term (current) drug therapy: Secondary | ICD-10-CM

## 2011-09-07 DIAGNOSIS — F341 Dysthymic disorder: Secondary | ICD-10-CM

## 2011-09-07 DIAGNOSIS — F418 Other specified anxiety disorders: Secondary | ICD-10-CM

## 2011-09-07 DIAGNOSIS — M79609 Pain in unspecified limb: Secondary | ICD-10-CM

## 2011-09-07 NOTE — Progress Notes (Signed)
Subjective:   HPI  Sara Leon is a 46 y.o. female who presents for recheck.  She is fasting today for repeat on cholesterol and sugar labs.  Been using Pravachol since last visit.  She does notes some increased thirst of recent.  She has hx/o diet controlled diabetes in the past and seemed to revert to normal blood sugar.  She has started boot camp to get back into exercising.  The first day of boot camp they did stairs, and she stepped down on foot hard and hurt her heel.  It has been hurting since.  She notes pain in back of left heel daily, worse with running.    She is doing ok in regards to mood.  Her and family filed for bankruptcy with all the financial problems they were having.  This has helped them come up with a plan and dealing with the stress has been better.  She feels like the medications are doing fine. No other aggravating or relieving factors.    No other c/o.  The following portions of the patient's history were reviewed and updated as appropriate: allergies, current medications, past family history, past medical history, past social history, past surgical history and problem list.  Past Medical History  Diagnosis Date  . Hypertension     Previous medication, but now controlled with weight loss and lifestyle change  . Anxiety   . Diabetes mellitus 10/2008    Was on medication briefly a diagnosis, however with lifestyle changes, blood sugar back to normal  . Hyperlipidemia     Diet-controlled, lifestyle controlled  . GERD (gastroesophageal reflux disease)   . Depression   . 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 Pancreatitis chronic     on resection specimen  . Deep venous thrombosis of upper extremity 2011    due to PICC  . Gallstones   . Pancreatic insufficiency 10/16/2010  . Fatty liver     appears improved on imaging after weight loss    No Known Allergies   Review of Systems ROS reviewed and was negative other  than noted in HPI or above.    Objective:   Physical Exam  General appearance: alert, no distress, WD/WN Oral cavity: MMM, no lesions Neck: supple, no lymphadenopathy, no thyromegaly, no masses Heart: RRR, normal S1, S2, no murmurs Lungs: CTA bilaterally, no wheezes, rhonchi, or rales Abdomen: +bs, soft, non tender, non distended, no masses, no hepatomegaly, no splenomegaly Pulses: 2+ symmetric, upper and lower extremities, normal cap refill   Assessment and Plan :     Encounter Diagnoses  Name Primary?  . Impaired fasting glucose Yes  . Hyperlipidemia   . Encounter for long-term (current) use of other medications   . Depression with anxiety   . Heel pain    Impaired fasting glucose - repeat HgbA1C today  Hyperlipidemia - labs today, compliant with Pravachol  Monitoring labs today  Depression w/ anxiety - doing ok on current medication regimen.  They have court date next week regarding bankruptcy proceedings.    Heel pain - possible heel spur vs stress fracture.   She will use heel cups OTC, ice, Aleve and if not improving with relative rest and avoiding impact sports, may need imaging.  Recheck 2-4 wk.

## 2011-09-08 LAB — COMPREHENSIVE METABOLIC PANEL
Albumin: 4.3 g/dL (ref 3.5–5.2)
BUN: 12 mg/dL (ref 6–23)
CO2: 24 mEq/L (ref 19–32)
Calcium: 8.7 mg/dL (ref 8.4–10.5)
Chloride: 106 mEq/L (ref 96–112)
Glucose, Bld: 114 mg/dL — ABNORMAL HIGH (ref 70–99)
Potassium: 4.3 mEq/L (ref 3.5–5.3)
Total Protein: 6.9 g/dL (ref 6.0–8.3)

## 2011-09-08 LAB — LIPID PANEL
Cholesterol: 208 mg/dL — ABNORMAL HIGH (ref 0–200)
HDL: 51 mg/dL (ref >39)
Triglycerides: 318 mg/dL — ABNORMAL HIGH (ref <150)

## 2011-09-10 ENCOUNTER — Other Ambulatory Visit: Payer: Self-pay | Admitting: Medical

## 2011-09-10 MED ORDER — PRAVASTATIN SODIUM 40 MG PO TABS: 40.0000 mg | ORAL_TABLET | Freq: Every day | ORAL | Status: DC

## 2011-09-10 NOTE — Telephone Encounter (Signed)
RX REFILL ON [facility]

## 2011-10-02 ENCOUNTER — Encounter: Payer: Self-pay | Admitting: Medical

## 2011-10-02 ENCOUNTER — Ambulatory Visit (INDEPENDENT_AMBULATORY_CARE_PROVIDER_SITE_OTHER): Payer: BC Managed Care – PPO | Admitting: Medical

## 2011-10-02 VITALS — BP 120/80 | HR 76 | Temp 98.2°F | Resp 16 | Wt 181.0 lb

## 2011-10-02 DIAGNOSIS — I10 Essential (primary) hypertension: Secondary | ICD-10-CM

## 2011-10-02 DIAGNOSIS — R51 Headache: Secondary | ICD-10-CM

## 2011-10-02 MED ORDER — HYDROCODONE-ACETAMINOPHEN 5-500 MG PO TABS: 1.0000 | ORAL_TABLET | Freq: Four times a day (QID) | ORAL | Status: AC | PRN

## 2011-10-02 NOTE — Progress Notes (Signed)
Subjective:   HPI  Sara Leon is a 46 y.o. female who presents for c/o elevated BP and headache.  Was on vacation last week at bike week in [place].  Since the weekend has had terrible headache, throbbing, started in front of head, but now all over.  Feels some nausea.  This morning checked her BP at work and it was 168/100.  She took one of her bosses BP pills, Lisinopril HCT which lowered her BP, but she came in here today as she thinks her BP is the problem.  She has hx/o HTN but was improved once she had prior lost weight and made diet changes.  She still has a lot of financial stress, and she is compliant with her medication for mood and depression.  No other aggravating or relieving factors.    No other c/o.  The following portions of the patient's history were reviewed and updated as appropriate: allergies, current medications, past family history, past medical history, past social history, past surgical history and problem list.  Past Medical History  Diagnosis Date  . Hypertension     Previous medication, but now controlled with weight loss and lifestyle change  . Anxiety   . Diabetes mellitus 10/2008    Was on medication briefly a diagnosis, however with lifestyle changes, blood sugar back to normal  . Hyperlipidemia     Diet-controlled, lifestyle controlled  . GERD (gastroesophageal reflux disease)   . Depression   . 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 Pancreatitis chronic     on resection specimen  . Deep venous thrombosis of upper extremity 2011    due to PICC  . Gallstones   . Pancreatic insufficiency 10/16/2010  . Fatty liver     appears improved on imaging after weight loss    No Known Allergies    Review of Systems Constitutional: -fever, -chills, -sweats, -unexpected -weight change,-fatigue ENT: -runny nose, -ear pain, -sore throat Cardiology:  -chest pain, -palpitations, -edema Respiratory: -cough,  -shortness of breath, -wheezing Gastroenterology: -abdominal pain, -nausea, -vomiting, -diarrhea Ophthalmology: -vision changes Urology: -dysuria, -difficulty urinating, -hematuria, -urinary frequency, -urgency Neurology: -weakness, -tingling, -numbness    Objective:   Physical Exam  General appearance: alert, no distress, WD/WN HEENT: normocephalic, sclerae anicteric, TMs pearly, nares patent, no discharge or erythema, pharynx normal Oral cavity: MMM, no lesions Neck: supple, no lymphadenopathy, no thyromegaly, no masses Heart: RRR, normal S1, S2, no murmurs Lungs: CTA bilaterally, no wheezes, rhonchi, or rales Pulses: 2+ symmetric, upper and lower extremities, normal cap refill Neuro: CN2-12 intact, nonfocal  Assessment and Plan :     Encounter Diagnoses  Name Primary?  . Headache Yes  . Essential hypertension, benign    Headache - like triggered by stress, characteristic of migraine.  Script for Lortab, rest in quiet dark room, call if not improving in 1-2 days.    HTN -  She was on medication prior, lost weight, had it managed by diet and exercise, but then has gained back some weight.   Advised she records readings a few times per week, and return and bring her own BP cuff for comparison in 67mo.

## 2011-11-05 ENCOUNTER — Other Ambulatory Visit: Payer: Self-pay | Admitting: Medical

## 2011-11-05 NOTE — Telephone Encounter (Signed)
RX REFILL 

## 2011-11-13 ENCOUNTER — Telehealth: Payer: Self-pay | Admitting: Medical

## 2011-11-13 ENCOUNTER — Other Ambulatory Visit: Payer: Self-pay | Admitting: Medical

## 2011-11-13 MED ORDER — ALPRAZOLAM 1 MG PO TABS: 1.0000 mg | ORAL_TABLET | Freq: Every evening | ORAL | Status: DC | PRN

## 2011-11-13 NOTE — Telephone Encounter (Signed)
Lmom notifiying the patient that i called out her Xanax. CLS

## 2011-11-13 NOTE — Telephone Encounter (Signed)
Call out xanax and let pt know

## 2011-11-13 NOTE — Progress Notes (Signed)
[facility] Crosby Oyster PA-C. CLS

## 2011-12-09 ENCOUNTER — Other Ambulatory Visit: Payer: Self-pay | Admitting: Medical

## 2011-12-10 NOTE — Telephone Encounter (Signed)
Rx refill on Effexor

## 2012-01-04 ENCOUNTER — Other Ambulatory Visit: Payer: Self-pay | Admitting: Medical

## 2012-01-04 NOTE — Telephone Encounter (Signed)
RX refill on Effexor.

## 2012-01-07 ENCOUNTER — Telehealth: Payer: Self-pay | Admitting: Internal Medicine

## 2012-01-08 ENCOUNTER — Other Ambulatory Visit: Payer: Self-pay | Admitting: Medical

## 2012-01-08 MED ORDER — ALPRAZOLAM 1 MG PO TABS: 1.0000 mg | ORAL_TABLET | Freq: Every evening | ORAL | Status: DC | PRN

## 2012-01-08 NOTE — Telephone Encounter (Signed)
Done by Vincenza Hews

## 2012-01-08 NOTE — Progress Notes (Signed)
I [facility] the [facility] per Crosby Oyster PA-C. CLS

## 2012-01-21 ENCOUNTER — Ambulatory Visit (INDEPENDENT_AMBULATORY_CARE_PROVIDER_SITE_OTHER): Payer: BC Managed Care – PPO | Admitting: Medical

## 2012-01-21 ENCOUNTER — Encounter: Payer: Self-pay | Admitting: Medical

## 2012-01-21 VITALS — BP 140/93 | HR 72 | Temp 98.0°F | Resp 16 | Wt 193.0 lb

## 2012-01-21 DIAGNOSIS — I10 Essential (primary) hypertension: Secondary | ICD-10-CM

## 2012-01-21 DIAGNOSIS — E785 Hyperlipidemia, unspecified: Secondary | ICD-10-CM

## 2012-01-21 DIAGNOSIS — E669 Obesity, unspecified: Secondary | ICD-10-CM

## 2012-01-21 NOTE — Progress Notes (Signed)
  Subjective:   HPI  ANGENETTE Leon is a 46 y.o. female who presents for recheck on BP.  Over a year ago she had worked very hard to establish a Leon routing of exercise, healthy diet, and had lost about 30lb from where she is currently.  She was once on medication in the past, but has been able to control her BP without medication.   She has not been checking her BP.  Recently though at work she had to have spirometry for protective mask test and her BP was 156/102.   They advised her to see doctor.   She is currently not exercising, but she avoids added salt in diet.   She does get occasional headaches.  No other aggravating or relieving factors.  She is c/o with her medications including medication for cholesterol.  She is non fasting today.  No other c/o.  The following portions of the patient's history were reviewed and updated as appropriate: allergies, current medications, past family history, past medical history, past social history, past surgical history and problem list.  Past Medical History  Diagnosis Date  . Hypertension     Previous medication, but now controlled with weight loss and lifestyle change  . Anxiety   . Diabetes mellitus 10/2008    Was on medication briefly a diagnosis, however with lifestyle changes, blood sugar back to normal  . Hyperlipidemia     Diet-controlled, lifestyle controlled  . GERD (gastroesophageal reflux disease)   . Depression   . 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 Pancreatitis chronic     on resection specimen  . Deep venous thrombosis of upper extremity 2011    due to PICC  . Gallstones   . Pancreatic insufficiency 10/16/2010  . Fatty liver     appears improved on imaging after weight loss    No Known Allergies   Review of Systems ROS reviewed and was negative other than noted in HPI or above.    Objective:   Physical Exam  General appearance: alert, no distress, WD/WN Neck: supple,  no lymphadenopathy, no thyromegaly, no masses Heart: RRR, normal S1, S2, no murmurs Lungs: CTA bilaterally, no wheezes, rhonchi, or rales Pulses: 2+ symmetric  Assessment and Plan :     Encounter Diagnoses  Name Primary?  . Essential hypertension, benign Yes  . Obesity   . Hyperlipidemia     HTN - she will again work on lifestyle changes to lose weight, focus on exercise and diet and lose some weight.  Looking back over the last 12 months, most her BP readings have been acceptable, but she has a few outliers that are elevated.   Obesity - advised lifestyle changes  Hyperlipidemia - recheck 24mo on BP, lipids and fasting labs.

## 2012-01-28 ENCOUNTER — Ambulatory Visit (INDEPENDENT_AMBULATORY_CARE_PROVIDER_SITE_OTHER): Payer: BC Managed Care – PPO | Admitting: Family Medicine

## 2012-01-28 ENCOUNTER — Encounter: Payer: Self-pay | Admitting: Family Medicine

## 2012-01-28 VITALS — BP 160/90 | HR 76 | Temp 98.6°F | Resp 16 | Wt 190.0 lb

## 2012-01-28 DIAGNOSIS — K861 Other chronic pancreatitis: Secondary | ICD-10-CM

## 2012-01-28 DIAGNOSIS — E119 Type 2 diabetes mellitus without complications: Secondary | ICD-10-CM

## 2012-01-28 DIAGNOSIS — R1011 Right upper quadrant pain: Secondary | ICD-10-CM

## 2012-01-28 DIAGNOSIS — R1013 Epigastric pain: Secondary | ICD-10-CM

## 2012-01-28 DIAGNOSIS — F341 Dysthymic disorder: Secondary | ICD-10-CM

## 2012-01-28 DIAGNOSIS — F418 Other specified anxiety disorders: Secondary | ICD-10-CM

## 2012-01-28 LAB — CBC WITH DIFFERENTIAL/PLATELET
Basophils Relative: 1 % (ref 0–1)
Eosinophils Absolute: 0.6 10*3/uL (ref 0.0–0.7)
HCT: 42.4 % (ref 36.0–46.0)
Hemoglobin: 14.2 g/dL (ref 12.0–15.0)
MCH: 30 pg (ref 26.0–34.0)
MCHC: 33.5 g/dL (ref 30.0–36.0)
Monocytes Absolute: 0.5 10*3/uL (ref 0.1–1.0)
Monocytes Relative: 5 % (ref 3–12)

## 2012-01-28 LAB — AMYLASE: Amylase: 33 U/L (ref 0–105)

## 2012-01-28 LAB — COMPREHENSIVE METABOLIC PANEL
AST: 21 U/L (ref 0–37)
Alkaline Phosphatase: 73 U/L (ref 39–117)
BUN: 9 mg/dL (ref 6–23)
Creat: 0.7 mg/dL (ref 0.50–1.10)
Potassium: 4.6 mEq/L (ref 3.5–5.3)

## 2012-01-28 LAB — HEMOGLOBIN A1C: Mean Plasma Glucose: 123 mg/dL — ABNORMAL HIGH (ref <117)

## 2012-01-28 MED ORDER — ESOMEPRAZOLE MAGNESIUM 40 MG PO CPDR: 40.0000 mg | DELAYED_RELEASE_CAPSULE | Freq: Every day | ORAL | Status: DC

## 2012-01-28 NOTE — Patient Instructions (Signed)
Please continue to abstain from any alcohol. I strongly encourage you to see a psychiatrist, as well as ongoing counseling.  Check into whether there is an EAP through work, and/or check with your insurance for local providers.
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
## 2012-01-29 ENCOUNTER — Encounter: Payer: Self-pay | Admitting: Family Medicine

## 2012-02-04 ENCOUNTER — Ambulatory Visit: Payer: BC Managed Care – PPO | Admitting: Medical

## 2012-02-05 ENCOUNTER — Encounter: Payer: Self-pay | Admitting: Medical

## 2012-02-05 ENCOUNTER — Ambulatory Visit (INDEPENDENT_AMBULATORY_CARE_PROVIDER_SITE_OTHER): Payer: BC Managed Care – PPO | Admitting: Medical

## 2012-02-05 VITALS — BP 140/90 | HR 76 | Temp 98.4°F | Wt 191.0 lb

## 2012-02-05 DIAGNOSIS — F1010 Alcohol abuse, uncomplicated: Secondary | ICD-10-CM

## 2012-02-05 DIAGNOSIS — J329 Chronic sinusitis, unspecified: Secondary | ICD-10-CM

## 2012-02-05 DIAGNOSIS — F329 Major depressive disorder, single episode, unspecified: Secondary | ICD-10-CM

## 2012-02-05 DIAGNOSIS — F32A Depression, unspecified: Secondary | ICD-10-CM

## 2012-02-05 DIAGNOSIS — K861 Other chronic pancreatitis: Secondary | ICD-10-CM

## 2012-02-05 DIAGNOSIS — R109 Unspecified abdominal pain: Secondary | ICD-10-CM

## 2012-02-05 MED ORDER — BENZONATATE 100 MG PO CAPS
100.0000 mg | ORAL_CAPSULE | Freq: Three times a day (TID) | ORAL | Status: DC | PRN
Start: 2012-02-05 — End: 2012-04-01

## 2012-02-05 MED ORDER — AMOXICILLIN-POT CLAVULANATE 875-125 MG PO TABS: 1.0000 | ORAL_TABLET | Freq: Two times a day (BID) | ORAL | Status: AC

## 2012-02-05 NOTE — Progress Notes (Signed)
Subjective: Here for cough and congestion.  3 days ago started having runny nose, sinus pressure, post nasal drip, but now it has settled in her chest, coughing up productive phlegm.  Denies fever, SOB, wheezing, NVD, no sick contacts.  Using robitussin.   She was seen here last week by Dr. Lynelle Doctor for abdominal pain.  Was thought to have pancreatitis given recently binge of alcohol abuse.  After stopping alcohol symptoms completely resolved.  She was advised to establish with psychiatry and counseling  She has seen counseling in the past, a few months ago.  She has had lots of stressors this past year.  She was not forthcoming to me about her alcohol abuse.  She denies any more alcohol since last week.    Of note, last tdap 7-8 years ago, pneumococcal vaccine 3 years ago.  Having flu shot at work this week.    Past Medical History  Diagnosis Date  . Hypertension     Previous medication, but now controlled with weight loss and lifestyle change  . Anxiety   . Diabetes mellitus 10/2008    Was on medication briefly a diagnosis, however with lifestyle changes, blood sugar back to normal  . Hyperlipidemia     Diet-controlled, lifestyle controlled  . GERD (gastroesophageal reflux disease)   . Depression   . 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 Pancreatitis chronic     on resection specimen  . Deep venous thrombosis of upper extremity 2011    due to PICC  . Gallstones   . Pancreatic insufficiency 10/16/2010  . Fatty liver     appears improved on imaging after weight loss     Objective:   Filed Vitals:   02/05/12 1003  BP: 140/90  Pulse: 76  Temp: 98.4 F (36.9 C)    General appearance: Alert, WD/WN, no distress                             Skin: warm, no rash                           Head: mild sinus tenderness,                            Eyes: conjunctiva normal, corneas clear, PERRLA                            Ears: pearly TMs, external  ear canals normal                          Nose: septum midline, turbinates swollen, with erythema and clear discharge             Mouth/throat: MMM, tongue normal, mild pharyngeal erythema                           Neck: supple, no adenopathy, no thyromegaly, nontender                          Heart: RRR, normal S1, S2, no murmurs                         Lungs: CTA bilaterally,  no wheezes, rales, or rhonchi      Assessment and Plan:   Encounter Diagnoses  Name Primary?  . Sinusitis Yes  . Abdominal pain   . Chronic pancreatitis   . Alcohol abuse   . Depression     sinusitis - begin augmentin, tessalon Perles for cough, rest, hydrate well.  Abdominal pain - resolved  Chronic pancreatitis - c/t pancreatic enzymes, healthy diet.  Will review prior yearly surveillance imaging for hx/o pancreatic mass.  Alcohol abuse and depression - advised to try and remain abstinent.  Advised she establish with psychiatry and and counseling.  i have been prescribing her depression medications for the past year or more, but at this point I agree that psychiatry referral would be in order.   She will make an appt.

## 2012-02-19 ENCOUNTER — Ambulatory Visit: Payer: BC Managed Care – PPO | Admitting: Medical

## 2012-03-07 ENCOUNTER — Other Ambulatory Visit: Payer: Self-pay | Admitting: Medical

## 2012-03-07 NOTE — Telephone Encounter (Signed)
RX REFILL ON [facility]

## 2012-03-07 NOTE — Telephone Encounter (Signed)
RX REFILL FOR XANAX.

## 2012-03-12 ENCOUNTER — Encounter: Payer: Self-pay | Admitting: Medical

## 2012-03-12 ENCOUNTER — Ambulatory Visit (INDEPENDENT_AMBULATORY_CARE_PROVIDER_SITE_OTHER): Payer: BC Managed Care – PPO | Admitting: Medical

## 2012-03-12 ENCOUNTER — Other Ambulatory Visit: Payer: Self-pay | Admitting: Medical

## 2012-03-12 VITALS — BP 160/90 | HR 75 | Temp 98.2°F | Resp 16 | Wt 193.0 lb

## 2012-03-12 DIAGNOSIS — F411 Generalized anxiety disorder: Secondary | ICD-10-CM

## 2012-03-12 DIAGNOSIS — E669 Obesity, unspecified: Secondary | ICD-10-CM

## 2012-03-12 DIAGNOSIS — F419 Anxiety disorder, unspecified: Secondary | ICD-10-CM

## 2012-03-12 DIAGNOSIS — I10 Essential (primary) hypertension: Secondary | ICD-10-CM

## 2012-03-12 MED ORDER — ATENOLOL 25 MG PO TABS: 25.0000 mg | ORAL_TABLET | Freq: Every day | ORAL | Status: DC

## 2012-03-12 NOTE — Telephone Encounter (Signed)
Is this okay?

## 2012-03-13 ENCOUNTER — Encounter: Payer: Self-pay | Admitting: Medical

## 2012-03-13 NOTE — Progress Notes (Signed)
Subjective:   HPI  Sara Leon is a 46 y.o. female who presents for c/o elevated BP.  She has hx/o hypertension.  Over the past 1.5 years she had gotten her weight down significantly through diet and exercise.  Had been on Diovan but was able to come off.  Over time things have been stressful this past year.  She had slowly stopped exercising.  At this point she has started back exercising, trying to eat healthy but frustrated the weight isn't coming off.  The last few visits here her pressure has been up, and she has noticed elevated readings at home regularly.  She reports that she stopped drinking alcohol.  No other c/o.  The following portions of the patient's history were reviewed and updated as appropriate: allergies, current medications, past family history, past medical history, past social history, past surgical history and problem list.  Past Medical History  Diagnosis Date  . Hypertension     Previous medication, but now controlled with weight loss and lifestyle change  . Anxiety   . Diabetes mellitus 10/2008    Was on medication briefly a diagnosis, however with lifestyle changes, blood sugar back to normal  . Hyperlipidemia     Diet-controlled, lifestyle controlled  . GERD (gastroesophageal reflux disease)   . Depression   . 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 Pancreatitis chronic     on resection specimen  . Deep venous thrombosis of upper extremity 2011    due to PICC  . Gallstones   . Pancreatic insufficiency 10/16/2010  . Fatty liver     appears improved on imaging after weight loss    Allergies  Allergen Reactions  . Quinapril     cough     Review of Systems ROS reviewed and was negative other than noted in HPI or above.    Objective:   Physical Exam  General appearance: alert, no distress, WD/WN Neck: supple, no lymphadenopathy, no thyromegaly, no masses, no bruits Heart: RRR, normal S1, S2, no  murmurs Lungs: CTA bilaterally, no wheezes, rhonchi, or rales Abdomen: +bs, soft, non tender, non distended, no masses, no hepatomegaly, no splenomegaly Pulses: 2+ symmetric, upper and lower extremities, normal cap refill Neuro: Cn2-12 intact, nonfocal exam   Adult ECG Report  Indication: Hypertension  Rate: 64bpm  Rhythm: normal sinus rhythm  QRS Axis: 7 degrees  PR Interval:  QRS Duration: 78ms  QTc:  Conduction Disturbances: none  Other Abnormalities: none  Patient's cardiac risk factors are: diabetes mellitus, dyslipidemia and hypertension.  EKG comparison: none  Narrative Interpretation: normal EKG    Assessment and Plan :     Encounter Diagnoses  Name Primary?  . Essential hypertension, benign Yes  . Anxiety   . Obesity    Restart on medication.  discussed options.  She did well on Diovan before but was expensive.   Begin Atenolol 25mg  daily for HTN as well as to have positive effect on anxiety symptoms.  If not tolerated, switch to Losartan.  Avoid salt added in diet.  C/t efforts to lose weight with diet and exercise as she has done in the past.  Recheck 45mo.

## 2012-03-13 NOTE — Telephone Encounter (Signed)
[facility] 

## 2012-03-14 ENCOUNTER — Telehealth: Payer: Self-pay | Admitting: Internal Medicine

## 2012-03-14 NOTE — Telephone Encounter (Signed)
Contrary to belief, pains in neck/head are not caused specifically by elevated pressure.  Now, if she has neck pain, muscle tension, etc., this can raise the BP.  Thus, the atenolol takes time to work, so c/t atenolol 25mg  daily in general.  regarding pain in head/neck, she can either try 1-2 Aleve BID, OTC Ibuprofen, or I can call out muscle relaxer or pain medication short term.  See what she wants to do.

## 2012-03-14 NOTE — Telephone Encounter (Signed)
Pt still is still hurting in back of her neck and head and her bp is still elevated. Being on atenolol 25mg  the pharmacist told her that her bp should come down after taking the pill and told her to call us. Yesterday bp was 155/92. This morning bp 162/98, and this afternoon bp was 169/102. Pt is concerned cause its still high

## 2012-03-14 NOTE — Telephone Encounter (Signed)
PATIENT IS AWARE OF WHAT Sara Leon RECOMMENDED. PATIENT DECLINED ANY MUSCLE RELAXERS OR PAIN MEDICATION. CLS

## 2012-04-01 ENCOUNTER — Encounter: Payer: Self-pay | Admitting: Medical

## 2012-04-01 ENCOUNTER — Ambulatory Visit (INDEPENDENT_AMBULATORY_CARE_PROVIDER_SITE_OTHER): Payer: BC Managed Care – PPO | Admitting: Medical

## 2012-04-01 ENCOUNTER — Encounter: Payer: Self-pay | Admitting: Family Medicine

## 2012-04-01 VITALS — BP 122/80 | Temp 98.2°F | Wt 195.0 lb

## 2012-04-01 DIAGNOSIS — R350 Frequency of micturition: Secondary | ICD-10-CM

## 2012-04-01 DIAGNOSIS — R300 Dysuria: Secondary | ICD-10-CM

## 2012-04-01 LAB — POCT URINALYSIS DIPSTICK
Leukocytes, UA: NEGATIVE
Nitrite, UA: NEGATIVE
Protein, UA: NEGATIVE
Urobilinogen, UA: NEGATIVE
pH, UA: 7

## 2012-04-01 MED ORDER — SULFAMETHOXAZOLE-TRIMETHOPRIM 800-160 MG PO TABS: 1.0000 | ORAL_TABLET | Freq: Two times a day (BID) | ORAL | Status: DC

## 2012-04-01 NOTE — Progress Notes (Signed)
Subjective:  Sara Leon is a 46 y.o. female who complains of few day hx/o urinary frequency, urgency, left low back pain,lower abdominal pressure, urinary odor, cloudy urine, some nausea, but no fever.  Been drinking more tea that water.  Thinks she has a UTI ,although symptoms better today.  Did take Azo over the weekend.  Last UTI months ago.   Objective:    Filed Vitals:   04/01/12 1045  BP: 122/80  Temp: 98.2 F (36.8 C)    General appearance: alert, no distress, WD/WN, female Abdomen: +bs, soft, non tender, non distended, no masses, no hepatomegaly, no splenomegaly, no bruits Back: no CVA tenderness Pulses: 2+ symmetric     Assessment and Plan:   Urinalysis not specific.   Will send culture.  Advised she increase water intake, cranberry juice a few days, and if symptoms worsen such as symptoms she had yesterday, then begin Bactrim, otherwise await culture.

## 2012-04-02 ENCOUNTER — Other Ambulatory Visit: Payer: Self-pay | Admitting: Medical

## 2012-04-02 NOTE — Telephone Encounter (Signed)
Rx refill for Effexor.

## 2012-04-05 LAB — URINE CULTURE

## 2012-04-07 ENCOUNTER — Other Ambulatory Visit: Payer: Self-pay | Admitting: Medical

## 2012-04-07 MED ORDER — NITROFURANTOIN MONOHYD MACRO 100 MG PO CAPS: 100.0000 mg | ORAL_CAPSULE | Freq: Two times a day (BID) | ORAL | Status: DC

## 2012-04-15 ENCOUNTER — Other Ambulatory Visit: Payer: Self-pay | Admitting: Medical

## 2012-04-16 ENCOUNTER — Other Ambulatory Visit: Payer: Self-pay | Admitting: Medical

## 2012-04-17 NOTE — Telephone Encounter (Signed)
Is this okay to refill? 

## 2012-04-18 ENCOUNTER — Telehealth: Payer: Self-pay | Admitting: Family Medicine

## 2012-04-18 NOTE — Telephone Encounter (Signed)
I CALLED OUT XANAX TO THE PHARMACY PER DAVID TYSINGER . CLS

## 2012-04-18 NOTE — Telephone Encounter (Signed)
Message copied by Janeice Robinson on Fri Apr 18, 2012  4:36 PM ------      Message from: Jac Canavan      Created: Fri Apr 18, 2012  7:58 AM       Call out the xanax with 1 refill

## 2012-05-16 ENCOUNTER — Other Ambulatory Visit: Payer: BC Managed Care – PPO

## 2012-05-20 ENCOUNTER — Telehealth: Payer: Self-pay | Admitting: Family Medicine

## 2012-05-20 ENCOUNTER — Encounter: Payer: Self-pay | Admitting: Medical

## 2012-05-20 ENCOUNTER — Ambulatory Visit (INDEPENDENT_AMBULATORY_CARE_PROVIDER_SITE_OTHER): Payer: BC Managed Care – PPO | Admitting: Medical

## 2012-05-20 VITALS — BP 150/90 | HR 72 | Temp 98.5°F | Resp 18 | Wt 205.0 lb

## 2012-05-20 DIAGNOSIS — R300 Dysuria: Secondary | ICD-10-CM

## 2012-05-20 DIAGNOSIS — I10 Essential (primary) hypertension: Secondary | ICD-10-CM

## 2012-05-20 DIAGNOSIS — N898 Other specified noninflammatory disorders of vagina: Secondary | ICD-10-CM

## 2012-05-20 DIAGNOSIS — R32 Unspecified urinary incontinence: Secondary | ICD-10-CM

## 2012-05-20 LAB — POCT URINALYSIS DIPSTICK
Bilirubin, UA: NEGATIVE
Glucose, UA: NEGATIVE
Ketones, UA: NEGATIVE
Spec Grav, UA: 1.015
pH, UA: 7

## 2012-05-20 MED ORDER — ALPRAZOLAM 1 MG PO TABS: ORAL_TABLET | ORAL | Status: DC

## 2012-05-20 MED ORDER — ATENOLOL 50 MG PO TABS: 50.0000 mg | ORAL_TABLET | Freq: Every day | ORAL | Status: DC

## 2012-05-20 MED ORDER — CIPROFLOXACIN HCL 500 MG PO TABS: 500.0000 mg | ORAL_TABLET | Freq: Two times a day (BID) | ORAL | Status: AC

## 2012-05-20 NOTE — Telephone Encounter (Signed)
Message copied by Janeice Robinson on Tue May 20, 2012  2:27 PM ------      Message from: Jac Canavan      Created: Tue May 20, 2012  2:07 PM       Refer to urology for incontinence x 2-3 years, worsening

## 2012-05-20 NOTE — Telephone Encounter (Signed)
I fax over OV notes, labs and insurance card to [facility] and they will schedule the appointment and notify us. CLS

## 2012-05-20 NOTE — Progress Notes (Signed)
Subjective:  Sara Leon is a 47 y.o. female who complains of few day hx/o urinary frequency, urgency, low back pain,lower abdominal pressure, urinary odor, cloudy urine, but no nausea, vomiting, or fever.  She has slight white vaginal dishcarge x 1wk, mild.  I saw her recently for same, got some better on Macrobid but feels like that UTI never fully cleared up.    She does c/o urinary incontinence x 2-3 years, worse with coughing,  Laughing, jumping or running.   She notes recently BPs no less than 150 SBP or no less than 90 DBP.    Past Medical History  Diagnosis Date  . Hypertension     Previous medication, but now controlled with weight loss and lifestyle change  . Anxiety   . Diabetes mellitus 10/2008    Was on medication briefly a diagnosis, however with lifestyle changes, blood sugar back to normal  . Hyperlipidemia     Diet-controlled, lifestyle controlled  . GERD (gastroesophageal reflux disease)   . Depression   . 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 Pancreatitis chronic     on resection specimen  . Deep venous thrombosis of upper extremity 2011    due to PICC  . Gallstones   . Pancreatic insufficiency 10/16/2010  . Fatty liver     appears improved on imaging after weight loss   ROS as in subjective  Objective:    Filed Vitals:   05/20/12 1333  BP: 150/90  Pulse: 72  Temp: 98.5 F (36.9 C)  Resp: 18    General appearance: alert, no distress, WD/WN, female Cardio: RRR,normal S1, S2, no murmurs Lungs: CTA Abdomen: +bs, soft, non tender, non distended, no masses, no hepatomegaly, no splenomegaly, no bruits Back: no CVA tenderness Pulses: 2+ symmetric     Assessment and Plan:   Encounter Diagnoses  Name Primary?  . Dysuria Yes  . Incontinence   . Vaginal discharge   . Essential hypertension, benign    Begin Cipro, will send urine for GC/Chlamydia to rule out other causes of urinary discomfort, will refer  to Urology for incontinence, and increased to Atenolol 50mg  daily. Recheck on BP in 58mo, repeat urine culture in 2wk clean catch.

## 2012-05-21 ENCOUNTER — Telehealth: Payer: Self-pay | Admitting: Family Medicine

## 2012-05-21 LAB — GC/CHLAMYDIA PROBE AMP, URINE: GC Probe Amp, Urine: NEGATIVE

## 2012-05-21 NOTE — Telephone Encounter (Signed)
I fax over OV notes, labs and insurance card to [facility] they will schedule the appointment and then contact our office. CLS

## 2012-05-21 NOTE — Telephone Encounter (Signed)
Message copied by Janeice Robinson on Wed May 21, 2012  9:45 AM ------      Message from: Jac Canavan      Created: Tue May 20, 2012  8:27 PM       Referral to Urology for incontinence

## 2012-06-07 ENCOUNTER — Other Ambulatory Visit: Payer: Self-pay | Admitting: Medical

## 2012-06-11 ENCOUNTER — Ambulatory Visit (INDEPENDENT_AMBULATORY_CARE_PROVIDER_SITE_OTHER): Payer: BC Managed Care – PPO | Admitting: Medical

## 2012-06-11 ENCOUNTER — Encounter: Payer: BC Managed Care – PPO | Admitting: Medical

## 2012-06-11 ENCOUNTER — Encounter: Payer: Self-pay | Admitting: Medical

## 2012-06-11 VITALS — BP 112/80 | HR 64 | Temp 98.3°F | Resp 16 | Ht 66.0 in | Wt 204.0 lb

## 2012-06-11 DIAGNOSIS — I10 Essential (primary) hypertension: Secondary | ICD-10-CM

## 2012-06-11 DIAGNOSIS — E785 Hyperlipidemia, unspecified: Secondary | ICD-10-CM

## 2012-06-11 DIAGNOSIS — F418 Other specified anxiety disorders: Secondary | ICD-10-CM

## 2012-06-11 DIAGNOSIS — H579 Unspecified disorder of eye and adnexa: Secondary | ICD-10-CM

## 2012-06-11 DIAGNOSIS — R062 Wheezing: Secondary | ICD-10-CM

## 2012-06-11 DIAGNOSIS — R053 Chronic cough: Secondary | ICD-10-CM

## 2012-06-11 DIAGNOSIS — F17200 Nicotine dependence, unspecified, uncomplicated: Secondary | ICD-10-CM

## 2012-06-11 DIAGNOSIS — Z113 Encounter for screening for infections with a predominantly sexual mode of transmission: Secondary | ICD-10-CM

## 2012-06-11 DIAGNOSIS — E119 Type 2 diabetes mellitus without complications: Secondary | ICD-10-CM

## 2012-06-11 DIAGNOSIS — R05 Cough: Secondary | ICD-10-CM

## 2012-06-11 DIAGNOSIS — A630 Anogenital (venereal) warts: Secondary | ICD-10-CM

## 2012-06-11 DIAGNOSIS — F341 Dysthymic disorder: Secondary | ICD-10-CM

## 2012-06-11 DIAGNOSIS — Z720 Tobacco use: Secondary | ICD-10-CM

## 2012-06-11 DIAGNOSIS — Z23 Encounter for immunization: Secondary | ICD-10-CM

## 2012-06-11 DIAGNOSIS — M19049 Primary osteoarthritis, unspecified hand: Secondary | ICD-10-CM

## 2012-06-11 DIAGNOSIS — R109 Unspecified abdominal pain: Secondary | ICD-10-CM

## 2012-06-11 DIAGNOSIS — Z0000 Encounter for general adult medical examination without abnormal findings: Secondary | ICD-10-CM

## 2012-06-11 DIAGNOSIS — F1010 Alcohol abuse, uncomplicated: Secondary | ICD-10-CM

## 2012-06-11 DIAGNOSIS — R059 Cough, unspecified: Secondary | ICD-10-CM

## 2012-06-11 LAB — BASIC METABOLIC PANEL
CO2: 25 mEq/L (ref 19–32)
Glucose, Bld: 95 mg/dL (ref 70–99)
Potassium: 4.7 mEq/L (ref 3.5–5.3)
Sodium: 135 mEq/L (ref 135–145)

## 2012-06-11 LAB — HEPATIC FUNCTION PANEL
AST: 19 U/L (ref 0–37)
Albumin: 4.2 g/dL (ref 3.5–5.2)
Alkaline Phosphatase: 81 U/L (ref 39–117)
Indirect Bilirubin: 0.5 mg/dL (ref 0.0–0.9)
Total Bilirubin: 0.6 mg/dL (ref 0.3–1.2)

## 2012-06-11 LAB — RPR

## 2012-06-11 LAB — POCT URINALYSIS DIPSTICK
Bilirubin, UA: NEGATIVE
Blood, UA: NEGATIVE
Glucose, UA: NEGATIVE
Ketones, UA: NEGATIVE
Spec Grav, UA: <=1.005
Urobilinogen, UA: NEGATIVE

## 2012-06-11 LAB — HEMOGLOBIN A1C: Hgb A1c MFr Bld: 6.9 % — ABNORMAL HIGH (ref <5.7)

## 2012-06-11 LAB — LIPID PANEL
HDL: 35 mg/dL — ABNORMAL LOW (ref >39)
LDL Cholesterol: 55 mg/dL (ref 0–99)
Total CHOL/HDL Ratio: 4.5 Ratio
Triglycerides: 328 mg/dL — ABNORMAL HIGH (ref <150)
VLDL: 66 mg/dL — ABNORMAL HIGH (ref 0–40)

## 2012-06-11 LAB — HIV ANTIBODY (ROUTINE TESTING W REFLEX): HIV: NONREACTIVE

## 2012-06-11 LAB — PROTIME-INR: INR: 0.95 (ref <1.50)

## 2012-06-11 LAB — HEPATITIS B SURFACE ANTIGEN: Hepatitis B Surface Ag: NEGATIVE

## 2012-06-11 NOTE — Progress Notes (Signed)
Subjective:   HPI  Sara Leon is a 47 y.o. female who presents for a complete physical.   Medical team: Primary care with me, Kristian Covey, PA Urology - [facility] Gynecology - Dr. Su Ley in [place] [facility] - Dr. Stan Head in [place] for follow up 2012, prior Dr. Casimiro Needle at [facility]  Whipple surgery -[facility] - Dr. Reyes Ivan Sees opthalmology - name not available Sees dentist - name not available  Preventative care: Last ophthalmology visit: has eye doctor but hasn't seen in 3 years Last dental visit:n/a, none recent Last colonoscopy:n/a Last mammogram:05/2011 Last gynecological exam:05/2011 Last AOZ:HYQMVH Last labs:01/2012  Prior vaccinations: TD or Tdap:2008 Influenza:02/2012 Pneumococcal:n/a Shingles/Zostavax:n/a  Concerns: Her main issues this past year have been related to her alcohol abuse, depression and mood.  We have seen her a few times this past year regarding this.   She notes that she decided in early this month to stop alcohol.   Last drink the first week of January.   She notes that she can sometimes drink 1-2 a day, but other times quite heavy.  She started AA meetings 2 weeks ago and so far is happy with this format, is contributing, involved in the group talk.  She is not seeig any other counseling.  Taking her medications as usual.  Diabetes - diet controlled, trying to eat relatively healthy.  Not checking glucose.  Was exercising some but not currently per urology recommendations pending studies. She has seen urology, having evaluation for incontinence.    HTN - compliant with medication, been seeing normal readings finally.  No complains with the medication.  Hyperlipidemia - compliant with medication, no issues.  Lately still having abdomina pain, mostly RUQ radiating to her back.  No nausea, vomiting, diarrhea, no blood in stool, no GU or vaginal symptoms.  Pain comes and goes.  She is  worried about her liver in light of her alcohol intake up until January.  She sees gynecology in [place].  Had treatment for warts this past year, there is only a small wart left.  Not due for pap until 08/2012.  She notes that her index and middle finger ache a times, and the joints swell.  Sometimes has trouble closing the fingers.  No wrist pain.  No morning stiffness.  She is smoking, started back about a year ago.  Lately having some intermittent wheezing, having a fairly regular cough.  No productive cough, no leg edema, no CP, no palpitations, no DOE.  She does report some nasal congestion, post nasal drip, occasional sneezing.   Reviewed their medical, surgical, family, social, medication, and allergy history and updated chart as appropriate.  Past Medical History  Diagnosis Date  . Anxiety   . GERD (gastroesophageal reflux disease)   . Depression   . 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 Pancreatitis chronic     on resection specimen  . Deep venous thrombosis of upper extremity 2011    due to PICC  . Pancreatic insufficiency 10/16/2010  . Fatty liver     appears improved on imaging after weight loss  . Diabetes mellitus 10/2008    diet controlled  . Hyperlipidemia   . Hypertension   . Condyloma acuminatum of vulva   . Osteoarthritis of hand     bilat 2nd and 3rd fingers at MCP, PIP  . Wears contact lenses   . Routine gynecological examination  Dr. Despina Hidden, [place]  . Bladder incontinence     Urology, pending studies 05/2012    Past Surgical History  Procedure Date  . Pancreaticoduodenectomy 05/2009     pancreatic intrepithelial neoplasia types 1A and 1B (Dr. Marilynn Rail)  . Tonsillectomy 1971  . Cholecystectomy 2000  . Abdominal hysterectomy 1987    Partial, still has her ovaries  . Dobbhoff feeding tube 07/04/09    [facility]  . Upper gastrointestinal endoscopy 2005 and 2010 - [place], [place]    gastritis 2005 and  2010, retained food 2005, no H. pyloi and duodenal bxs normal  . Upper endoscopic ultrasound w/ fna 05/12/2009    Uncinate process mass - Dr. Lanell Matar  . Pancreaticoduodenectomy 06/10/09    [facility], Dr. Marilynn Rail    Family History  Problem Relation Age of Onset  . Crohn's disease      nephew  . Colon cancer Maternal Grandmother   . Cancer Maternal Grandmother     colon  . Cancer Mother 7    died of melanoma  . Hypertension Father   . Heart disease Father     MI  . Hyperlipidemia Father   . Other Father     died in MVA  . Cancer Maternal Grandfather     colon  . Heart disease Paternal Grandfather     History   Social History  . Marital Status: Married    Spouse Name: N/A    Number of Children: 1  . Years of Education: N/A   Occupational History  . process Electrical engineer [employer]   Social History Main Topics  . Smoking status: Former Smoker -- 1.0 packs/day  . Smokeless tobacco: Never Used  . Alcohol Use: No     Comment: started AA 05/2012.  Last drink early January  . Drug Use: No  . Sexually Active: Yes -- Female partner(s)   Other Topics Concern  . 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  Exercises at the [employer]    Current Outpatient Prescriptions on File Prior to Visit  Medication Sig Dispense Refill  . atenolol (TENORMIN) 50 MG tablet Take 1 tablet (50 mg total) by mouth daily.  30 tablet  2  . pravastatin (PRAVACHOL) 40 MG tablet Take 1 tablet (40 mg total) by mouth daily.  30 tablet  5  . risperiDONE (RISPERDAL) 1 MG tablet TAKE 1 TABLET EVERY DAY  30 tablet  2  . venlafaxine XR (EFFEXOR-XR) 150 MG 24 hr capsule TAKE 1 CAPSULE (150 MG TOTAL) BY MOUTH DAILY.  30 capsule  2  . venlafaxine XR (EFFEXOR-XR) 75 MG 24 hr capsule TAKE 1 CAPSULE (75 MG TOTAL) BY MOUTH DAILY.  30 capsule  2  . esomeprazole (NEXIUM) 40 MG capsule Take 1 capsule (40 mg total) by mouth daily.  10 capsule   0  . Multiple Vitamins-Minerals (MULTIVITAMIN WITH MINERALS) tablet Take 1 tablet by mouth daily. Food enzyme supplement OTC Natures Sunshine        Allergies  Allergen Reactions  . Quinapril     cough     Review of Systems Constitutional: +fever, -chills, -sweats, -unexpected weight change, -decreased appetite, -fatigue Allergy: +sneezing, -itching, +congestion Dermatology: -changing moles, --rash, -lumps ENT: +runny nose, -ear pain, -sore throat, -hoarseness, -sinus pain, -teeth pain, - ringing in ears, -hearing loss, -nosebleeds Cardiology: -chest pain, -palpitations, -swelling, -difficulty breathing when lying flat, -waking up short of breath  Respiratory: +cough, -shortness of breath, -difficulty breathing with exercise or exertion, -wheezing, -coughing up blood Gastroenterology: +abdominal pain, -nausea, -vomiting, -diarrhea, -constipation, -blood in stool, -changes in bowel movement, -difficulty swallowing or eating Hematology: -bleeding, -bruising  Musculoskeletal: -joint aches, -muscle aches, -joint swelling, -back pain, -neck pain, -cramping, -changes in gait Ophthalmology: denies vision changes, eye redness, itching, discharge Urology: -burning with urination, -difficulty urinating, -blood in urine, -urinary frequency, -urgency, -incontinence Neurology: -headache, -weakness, -tingling, -numbness, -memory loss, -falls, -dizziness Psychology: -depressed mood, -agitation, -sleep problems     Objective:   Physical Exam  Reviewed vitals, nurse notes, eye exam which was abnormal, corrected  General appearance: alert, no distress, WD/WN, white female Skin: no caput medusa, spider angiomata, multiple tattoos - arms, legs, back, no specific worrisome lesions, scattered benign macules, warm, dry HEENT: normocephalic, conjunctiva/corneas normal, sclerae anicteric, PERRLA, EOMi, nares patent, no discharge or erythema, pharynx normal Oral cavity: MMM, tongue normal, teeth in good  repair, some stain Neck: supple, no lymphadenopathy, no thyromegaly, no masses, normal ROM, no bruits Chest: non tender, normal shape and expansion Heart: RRR, normal S1, S2, no murmurs Lungs: CTA bilaterally, no wheezes, rhonchi, or rales Abdomen: +bs, soft, vertical surgical scar, mild to moderate RUQ and LUQ and LLQ tenderness, no rebound, no guarding, non distended, no masses, no hepatomegaly, no splenomegaly, no bruits Back: non tender, normal ROM, no scoliosis Musculoskeletal: upper extremities non tender, no obvious deformity, normal ROM throughout, lower extremities non tender, no obvious deformity, normal ROM throughout Extremities: no edema, no cyanosis, no clubbing Pulses: 2+ symmetric, upper and lower extremities, normal cap refill Neurological: alert, oriented x 3, CN2-12 intact, strength normal upper extremities and lower extremities, sensation normal throughout, DTRs 2+ throughout, no cerebellar signs, gait normal, no asterixis Psychiatric: somewhat flat affect as usual, smiling at times, answers questions appropriately, pleasant  Breast/gyn/rectal - deferred to gynecology    Assessment and Plan :    Encounter Diagnoses  Name Primary?  . Routine general medical examination at a health care facility Yes  . Abdominal pain   . Alcohol abuse   . Depression with anxiety   . Screen for STD (sexually transmitted disease)   . Essential hypertension, benign   . Hyperlipidemia   . Type II or unspecified type diabetes mellitus without mention of complication, not stated as uncontrolled   . Wheezing   . Chronic cough   . Tobacco use   . Need for pneumococcal vaccination   . Osteoarthritis of hand   . Condyloma acuminatum of vulva   . Abnormal vision screen      Physical exam - discussed healthy lifestyle, diet, exercise, preventative care, vaccinations, and addressed their concerns.    Abdominal pain - labs today to further evaluate.  Alcohol abuse - glad to hear she  stopped alcohol and begun AA.  She will c/t AA.  Strongly advised she avoid alcohol altogether.  Depression with anxiety - she has been reluctant to go to counseling in general, but seems motivated and accepting of AA setting.  C/t same medications.   We discussed possibly changing Metoprolol to something else, but she doesn't feel that this has made any difference in her mood and she is happy that her BP finally is within normal again.  STD screening today including hepatitis panel given her tattoo history and RUQ pain.  HTN - controlled on current medication  Hyperlipidemia - labs today, c/t current medication  Diabetes type II - diet controlled, recheck labs today.  She needs  to get back into a more aggressive diet and exercise regimen as she has done in the past.   She is holding off on exercise pending urodynamic studies with urology for the incontinence issues.  Wheezing, chronic cough, tobacco use - advised she again quit tobacco.  She picked them back up this past year.  Will send for chest xray today.  Discussed possible causes of the cough.  No obvious suggestion of cardiac cause today.  No wheezing on exam.  pneumococcal vaccine, VIS and counseling given  OA - mild of hand, bilat MCP, PIPs 2nd and 3rd digits bilat.  Can use OTC Aleve prn or Tylenol, limited, not daily.  Condyloma acuminata -  She will f/u with gynecology in [place] in April, will likely have additional treatment of the condyloma acuminata at that time.  She is too early for pap at this time.    Abnormal vision screen - need to have f/u with opthalmology.  She will set this up.  Follow-up pending studies

## 2012-06-12 ENCOUNTER — Encounter: Payer: Self-pay | Admitting: Medical

## 2012-06-13 ENCOUNTER — Other Ambulatory Visit: Payer: Self-pay | Admitting: Medical

## 2012-06-13 MED ORDER — METFORMIN HCL 500 MG PO TABS: 500.0000 mg | ORAL_TABLET | Freq: Two times a day (BID) | ORAL | Status: DC

## 2012-06-18 ENCOUNTER — Telehealth: Payer: Self-pay | Admitting: Family Medicine

## 2012-06-18 NOTE — Telephone Encounter (Signed)
Pt called and said she picked up glucometer here but needs rx for test strips.  One Touch Verio IQ test strips to [facility] [address]

## 2012-06-18 NOTE — Telephone Encounter (Signed)
Call out glucose testing supplies, testing once day

## 2012-06-18 NOTE — Telephone Encounter (Signed)
I fax over refill for lancets and test to the patients pharmacy. CLS

## 2012-07-01 ENCOUNTER — Other Ambulatory Visit: Payer: Self-pay | Admitting: Medical

## 2012-07-05 ENCOUNTER — Other Ambulatory Visit: Payer: Self-pay | Admitting: Medical

## 2012-07-14 ENCOUNTER — Other Ambulatory Visit: Payer: Self-pay | Admitting: Medical

## 2012-07-15 NOTE — Telephone Encounter (Signed)
I called out Xanax to the patients pharmacy per David Tysinger PAC. CLS 

## 2012-07-15 NOTE — Telephone Encounter (Signed)
Is this okay?

## 2012-07-22 ENCOUNTER — Ambulatory Visit: Payer: BC Managed Care – PPO | Admitting: Medical

## 2012-08-17 ENCOUNTER — Other Ambulatory Visit: Payer: Self-pay | Admitting: Medical

## 2012-09-15 ENCOUNTER — Other Ambulatory Visit: Payer: Self-pay | Admitting: Medical

## 2012-09-15 NOTE — Telephone Encounter (Signed)
IS THIS OKAY TO REFILL 

## 2012-09-15 NOTE — Telephone Encounter (Signed)
I called out xanax to the pharmacy per Crosby Oyster PA-C. CLS

## 2012-09-17 ENCOUNTER — Encounter: Payer: Self-pay | Admitting: Medical

## 2012-09-17 ENCOUNTER — Ambulatory Visit (INDEPENDENT_AMBULATORY_CARE_PROVIDER_SITE_OTHER): Payer: BC Managed Care – PPO | Admitting: Medical

## 2012-09-17 VITALS — BP 102/70 | HR 64 | Temp 98.7°F | Resp 16 | Wt 178.0 lb

## 2012-09-17 DIAGNOSIS — H409 Unspecified glaucoma: Secondary | ICD-10-CM

## 2012-09-17 DIAGNOSIS — I10 Essential (primary) hypertension: Secondary | ICD-10-CM

## 2012-09-17 DIAGNOSIS — E119 Type 2 diabetes mellitus without complications: Secondary | ICD-10-CM

## 2012-09-17 DIAGNOSIS — E785 Hyperlipidemia, unspecified: Secondary | ICD-10-CM

## 2012-09-17 DIAGNOSIS — F1010 Alcohol abuse, uncomplicated: Secondary | ICD-10-CM

## 2012-09-17 NOTE — Progress Notes (Signed)
Subjective:    Sara Leon is a 47 y.o. female who presents for follow-up of Type 2 diabetes mellitus.  Last visit 1/13, HgbA1C 6.8%.   Since last visit she has lost weight, been more aggressive with healthy lifestyle.  She is non fasting today.  Home blood sugar records: fasting range: 90-130  Current symptoms/problems include none and have been unchanged. Daily foot checks, foot concerns: no concerns Last eye exam:  Within last 2 months.  Diagnosed with glaucoma.  Has recheck in 4 mo.  No drops started.  Watch and wait approach while she gets BP and sugar under better control.    Medication compliance: Current diet: in general, a "healthy" diet   Current exercise: aerobics Known diabetic complications: none Cardiovascular risk factors: diabetes mellitus, dyslipidemia and hypertension  compliant with medication for BP and triglycerides/cholesterol  The following portions of the patient's history were reviewed and updated as appropriate: allergies, current medications, past family history, past medical history, past social history, past surgical history and problem list.  ROS as in subjective above  Past Medical History  Diagnosis Date  . Anxiety   . GERD (gastroesophageal reflux disease)   . Depression   . 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 Pancreatitis chronic     on resection specimen  . Deep venous thrombosis of upper extremity 2011    due to PICC  . Pancreatic insufficiency 10/16/2010  . Fatty liver     appears improved on imaging after weight loss  . Diabetes mellitus 10/2008    diet controlled  . Hyperlipidemia   . Hypertension   . Condyloma acuminatum of vulva   . Osteoarthritis of hand     bilat 2nd and 3rd fingers at MCP, PIP  . Wears contact lenses   . Routine gynecological examination     Dr. Despina Hidden, [place]  . Bladder incontinence     Urology, pending studies 05/2012      Objective:    BP 102/70   Pulse 64  Temp(Src) 98.7 F (37.1 C) (Oral)  Resp 16  Wt 178 lb (80.74 kg)  BMI 28.74 kg/m2  Filed Vitals:   09/17/12 1337  BP: 102/70  Pulse: 64  Temp: 98.7 F (37.1 C)  Resp: 16    General appearance: alert, no distress, WD/WN Heart: RRR, normal S1, S2, no murmurs Lungs: CTA bilaterally, no wheezes, rhonchi, or rales Abdomen: +bs, soft, non tender, non distended, no masses, no hepatomegaly, no splenomegaly Pulses: 2+ symmetric, upper and lower extremities, normal cap refill Ext: no edema Skin: noticeably tanned from 5day/wk tanning   Lab Review Lab Results  Component Value Date   HGBA1C 6.9* 06/11/2012   Lab Results  Component Value Date   CHOL 156 06/11/2012   HDL 35* 06/11/2012   LDLCALC 55 06/11/2012   TRIG 328* 06/11/2012   CHOLHDL 4.5 06/11/2012   No results found for this basenameConcepcion Elk     Chemistry      Component Value Date/Time   NA 135 06/11/2012 1302   K 4.7 06/11/2012 1302   CL 102 06/11/2012 1302   CO2 25 06/11/2012 1302   BUN 8 06/11/2012 1302   CREATININE 0.71 06/11/2012 1302   CREATININE 0.81 04/22/2009 1344      Component Value Date/Time   CALCIUM 9.3 06/11/2012 1302   ALKPHOS 81 06/11/2012 1302   AST 19 06/11/2012 1302   ALT 23 06/11/2012 1302   BILITOT 0.6 06/11/2012  1302        Chemistry      Component Value Date/Time   NA 135 06/11/2012 1302   K 4.7 06/11/2012 1302   CL 102 06/11/2012 1302   CO2 25 06/11/2012 1302   BUN 8 06/11/2012 1302   CREATININE 0.71 06/11/2012 1302   CREATININE 0.81 04/22/2009 1344      Component Value Date/Time   CALCIUM 9.3 06/11/2012 1302   ALKPHOS 81 06/11/2012 1302   AST 19 06/11/2012 1302   ALT 23 06/11/2012 1302   BILITOT 0.6 06/11/2012 1302      Assessment:   Encounter Diagnoses  Name Primary?  . Type II or unspecified type diabetes mellitus without mention of complication, not stated as uncontrolled Yes  . Essential hypertension, benign   . Dyslipidemia   . Alcohol abuse   . Glaucoma      Plan:    Rx changes: none  Education: Reviewed 'ABCs' of diabetes management (respective goals in parentheses):  A1C (<7), blood pressure (<130/80), and cholesterol (LDL <100). Compliance at present is estimated to be excellent. Efforts to improve compliance (if necessary) will be directed at c/t current diet, exercise, avoiding alcohol, f/u 73mo. She has remained abstinent since January, has went to numerous AA meetings, seems to be doing ok.  I advised she remain abstinent. g  Glaucoma - f/u with ophthalmology as planned  Follow up: 3 months

## 2012-09-29 ENCOUNTER — Other Ambulatory Visit: Payer: Self-pay | Admitting: Medical

## 2012-09-29 NOTE — Telephone Encounter (Signed)
Is this okay to refill? 

## 2012-10-13 ENCOUNTER — Ambulatory Visit (INDEPENDENT_AMBULATORY_CARE_PROVIDER_SITE_OTHER): Payer: BC Managed Care – PPO | Admitting: Medical

## 2012-10-13 ENCOUNTER — Other Ambulatory Visit: Payer: Self-pay | Admitting: Medical

## 2012-10-13 ENCOUNTER — Encounter: Payer: Self-pay | Admitting: Medical

## 2012-10-13 ENCOUNTER — Other Ambulatory Visit: Payer: Self-pay

## 2012-10-13 ENCOUNTER — Telehealth: Payer: Self-pay | Admitting: Internal Medicine

## 2012-10-13 VITALS — BP 110/80 | HR 68 | Temp 98.4°F | Resp 16 | Wt 180.0 lb

## 2012-10-13 DIAGNOSIS — M7552 Bursitis of left shoulder: Secondary | ICD-10-CM

## 2012-10-13 DIAGNOSIS — M62830 Muscle spasm of back: Secondary | ICD-10-CM

## 2012-10-13 DIAGNOSIS — M5380 Other specified dorsopathies, site unspecified: Secondary | ICD-10-CM

## 2012-10-13 DIAGNOSIS — Z1231 Encounter for screening mammogram for malignant neoplasm of breast: Secondary | ICD-10-CM

## 2012-10-13 DIAGNOSIS — R079 Chest pain, unspecified: Secondary | ICD-10-CM

## 2012-10-13 DIAGNOSIS — Z8249 Family history of ischemic heart disease and other diseases of the circulatory system: Secondary | ICD-10-CM

## 2012-10-13 DIAGNOSIS — M67919 Unspecified disorder of synovium and tendon, unspecified shoulder: Secondary | ICD-10-CM

## 2012-10-13 MED ORDER — VENLAFAXINE HCL ER 150 MG PO CP24: 150.0000 mg | ORAL_CAPSULE | Freq: Every day | ORAL | Status: DC

## 2012-10-13 NOTE — Telephone Encounter (Signed)
Message copied by Janeice Robinson on Mon Oct 13, 2012  3:05 PM ------      Message from: Jac Canavan      Created: Mon Oct 13, 2012  2:25 PM       Referral to Dr. Donnie Aho - baseline cardiac eval ------

## 2012-10-13 NOTE — Telephone Encounter (Signed)
Refill request for venlafaxine ER 150mg  to [facility] [address]

## 2012-10-13 NOTE — Progress Notes (Signed)
Subjective: Here for chest pain.  Had one episode Friday night 3 nights ago, but again this morning has had episodes for chest pain.  Pain is brief stabbing, lasts few minutes then goes away.  No associated sweats, nausea, SOB, DOE, edema.   Does get a little dizzy.   She also reports left shoulder pain and pain between shoulder blades intermittent.  Pain lifting left shoulder above her head.   Denies numbness, tingling, weakness.  No palpitations, no vision, speech or hearing changes.   The only recent change is that pharmacy mixed up her dose, and the last 2 weeks has been taking Effexor at lower does due to dispensing error.   Should be on Effexor 150mg  + 75mg  daily, but last 2 weeks had 37.5mg  dose instead of her usual 75mg .  In the past when she has tried to use lower dose, had similar chest feeling.  otherwise has been walking for exercise without issue.  She does smoke 10-12 cigarettes daily.  No other c/o.   Past Medical History  Diagnosis Date  . Anxiety   . GERD (gastroesophageal reflux disease)   . Depression   . 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 Pancreatitis chronic     on resection specimen  . Deep venous thrombosis of upper extremity 2011    due to PICC  . Pancreatic insufficiency 10/16/2010  . Fatty liver     appears improved on imaging after weight loss  . Diabetes mellitus 10/2008    diet controlled  . Hyperlipidemia   . Hypertension   . Condyloma acuminatum of vulva   . Osteoarthritis of hand     bilat 2nd and 3rd fingers at MCP, PIP  . Wears contact lenses   . Routine gynecological examination     Dr. Despina Hidden, [place]  . Bladder incontinence     Urology, pending studies 05/2012   ROS as in subjective  Objective: Filed Vitals:   10/13/12 1040  BP: 110/80  Pulse: 68  Temp:   Resp:     General appearance: alert, no distress, WD/WN,  Neck: supple, no lymphadenopathy, no thyromegaly, no masses, no JVD Heart: RRR,  normal S1, S2, no murmurs Lungs: CTA bilaterally, no wheezes, rhonchi, or rales Pulses: 2+ symmetric, upper and lower extremities, normal cap refill MSK: tender left AC joint, pain with left abduction over 90 degrees, otherwise nontender, no deformity, rest of ROM normal, rest of UE nontender Back; tender right paraspinal region upper Ext: no edema, no cyanosis or clubbing    Adult ECG Report  Indication: chest pain  Rate: 62 bpm  Rhythm: normal sinus rhythm  QRS Axis: 9 degrees  PR Interval: 188 ms  QRS Duration: 76ms  QTc:  Conduction Disturbances: none  Other Abnormalities: none  Patient's cardiac risk factors are: diabetes mellitus, dyslipidemia, family history of premature cardiovascular disease, hypertension and smoking/ tobacco exposure.  EKG comparison: 02/2012, unchanged  Narrative Interpretation: normal EKG    Assessment: Encounter Diagnoses  Name Primary?  . Chest pain Yes  . Shoulder bursitis, left   . Spasm of back muscles   . Family history of premature coronary artery disease      Plan: Chest pain -  Discussed symptoms.  symptoms somewhat atypical.  Could be related to adverse effect of begin on lower dose of antidepressant lately and worse anxiety, but she does have significant cardiac risk factors (HTN, hyperlipidemia, smoker, premature family hx/o CAD).   Thus,  she desires referral for baseline cardiac eval.   Discussed signs of MI/ACS what would prompt 911 call.  Shoulder bursitis, spasm of back muscles - advised relative rest, ice to shoulder, limit over the head motions and above 90 degree motion of left shoulder for the next week or so, can use arm sling prn for few hours at a time, can begin samples of Naprelan x 3 days, then OTC Aleve prn.   If not improving, let me know  Follow-up pending referral

## 2012-10-13 NOTE — Telephone Encounter (Signed)
Patient is aware of her appointment to see Dr. Donnie Aho on 10/17/12 @ 1030 am. CLS [phone]

## 2012-10-13 NOTE — Patient Instructions (Addendum)
Chest pain - for now, rest, take the 37.5 mg Effexor now, and tonight go back to your usual regimen of Effexor 150mg  and 75mg  daily.    If you have worsening chest pains, call 911.   We will set you up for baseline cardiac screening with Dr. Donnie Aho.  You also appear to have shoulder bursitis.   Begin Naprelan 750mg  tablets daily for 3 days, then use OTC Aleve 1-2 times daily as needed.   Use ice pack to the left shoulder.  Use arm sling for a few hours at a time, don't lift above 90 degrees with left shoulder when possible.    Sharen Hint [facility] [address] [place] [address] [place], [place] [address] [phone] [email or web address]    SYMPTOMS OF A HEART ATTACK The most common signs and symptoms include:   Tightness or squeezing in the chest.   Feeling of heaviness on the chest.   Discomfort in the arms, neck, or jaw.   Shortness of breath and nausea.   Cold, wet skin.   Chest pain or discomfort brought on by physical effort or excitement which increase the oxygen needs of the heart.   SEEK IMMEDIATE MEDICAL CARE IF:   You develop nausea, vomiting, or shortness of breath.   You feel faint, lightheaded, or pass out.   Your chest discomfort gets worse.   You are sweating or experience sudden profound fatigue.   Your discomfort lasts longer than 15 minutes.   If you have a history of heart disease or angina, you should tell your caregiver right away about any increase in the severity or frequency of your chest discomfort or angina attacks. When you have angina, you should stop what you are doing and sit down. This may bring relief in 3 to 5 minutes. If your caregiver has prescribed nitro, take it as directed.   WHAT IS A HEART ATTACK: Myocardial Infarction A myocardial infarction (MI) is damage to the heart that is not reversible. It is also called a heart attack. An MI usually occurs when a heart (coronary) artery becomes blocked or narrowed. This cuts off the  blood supply to the heart. When one or more of the heart (coronary) arteries becomes blocked, that area of the heart begins to die. This causes pain felt during an MI.  If you think you might be having an MI, call your local emergency services immediately (911 in [place] It is recommended that you take a 162 mg non-enteric coated aspirin if you do not have an aspirin allergy. Do not drive yourself to the hospital or wait to see if your symptoms go away. The sooner MI is treated, the greater the amount of heart muscle saved. Time is muscle. It can save your life. CAUSES  An MI can occur from:  A gradual buildup of a fatty substance called plaque. When plaque builds up in the arteries, this condition is called atherosclerosis. This buildup can block or reduce the blood supply to the heart artery(s).   A sudden plaque rupture within a heart artery that causes a blood clot (thrombus). A blood clot can block the heart artery which does not allow blood flow to the heart.   A severe tightening (spasm) of the heart artery. This is a less common cause of a heart attack. When a heart artery spasms, it cuts off blood flow through the artery. Spasms can occur in heart arteries that do not have atherosclerosis.  RISK FACTORS People at risk for an  MI usually have one or more risk factors, such as:  High blood pressure.   High cholesterol.   Smoking.   Gender. Men have a higher heart attack risk.   Overweight/obesity.   Age.   Family history.   Lack of exercise.   Diabetes.   Stress.   Excessive alcohol use.   Street drug use (cocaine and methamphetamines).

## 2012-10-14 ENCOUNTER — Telehealth: Payer: Self-pay | Admitting: Family Medicine

## 2012-10-14 NOTE — Telephone Encounter (Signed)
Patient is aware of her appointment to see Dr. Tilley on 10/17/12 @ 1030 am. CLS [phone] 

## 2012-10-17 ENCOUNTER — Encounter: Payer: Self-pay | Admitting: Cardiology

## 2012-10-17 DIAGNOSIS — I10 Essential (primary) hypertension: Secondary | ICD-10-CM | POA: Insufficient documentation

## 2012-10-17 NOTE — Progress Notes (Signed)
Patient ID: Sara Leon, female   DOB: 01/18/1966, 47 y.o.   MRN: [id number]  [name], [name]    Date of visit:  10/17/2012 DOB:  19-Mar-1966    Age:  46 yrs. Medical record number:  [id number]     Account number:  [id number] Primary Care Provider: Ernst Breach ____________________________ CURRENT DIAGNOSES  1. Chest Pain  2. Diabetes Mellitus-NIDD  3. Hyperlipidemia  4. Hypertension,Essential (Benign) ____________________________ ALLERGIES  Quinapril, Intolerance-cough ____________________________ MEDICATIONS  1. Effexor XR 75 mg capsule,extended release 24hr, QHS  2. Effexor XR 150 mg capsule,extended release 24hr, QHS  3. alprazolam 1 mg tablet, 2 hs  4. risperidone 1 mg tablet, QHS  5. pravastatin 40 mg tablet, 1 p.o. daily  6. atenolol 50 mg tablet, 1 p.o. daily  7. metformin 500 mg tablet, BID ____________________________ CHIEF COMPLAINTS  Chest pressure  L arm pain ____________________________ HISTORY OF PRESENT ILLNESS This very nice 47 year old female is seen for evaluation of chest discomfort. The patient has a prior history of diabetes mellitus, hypertension and hyperlipidemia and has a remote history of significant alcohol use. She is currently sober but had chronic pancreatitis resulting eventually in a Whipple procedure at [facility] in 2011 she presented to her primary care physician with an episode of chest tightness that awakened her from sleep described as pressure or heaviness associated with mild shortness of breath. She thinks it lasted from 3-5 minutes. She then had a recurrent episode that occurred a day or 2 later associated with some mild radiation to her left arm and tingling of her hands. The discomfort persisted on and off for a couple of days and she was seen by her primary care provider and I was asked to see her for cardiac consultation. She is not currently having chest pain at present and has been able to walk without exertional chest  discomfort. She does smoke significantly but is trying to cut down. She has no PND, orthopnea, syncope, palpitations, or claudication. ____________________________ PAST HISTORY  Past Medical Illnesses:  hypertension, DM-non-insulin dependent, hyperlipidemia, chronic pancreatitis with prior Whipple 2011, anxiety, depression;  Cardiovascular Illnesses:  no previous history of cardiac disease.;  Surgical Procedures:  cholecystectomy (lap), Whipple procedure 2011, hysterectomy, tonsillectomy;  Cardiology Procedures-Invasive:  no history of prior cardiac procedures;  Cardiology Procedures-Noninvasive:  no previous non-invasive procedures;  LVEF not documented,   ____________________________ CARDIO-PULMONARY TEST DATES EKG Date:  10/17/2012;   ____________________________ FAMILY HISTORY Brother -- Coronary Artery Disease Brother -- Brother alive and well Father -- Coronary Artery Disease, Hypertension, Hypercholesterolemia Mother -- Malignant melanoma, Deceased Sister -- Sister alive and well ____________________________ SOCIAL HISTORY Alcohol Use:  no alcohol use;  Smoking:  smokes less than 1 ppd;  Diet:  carbohydrate modified diet;  Lifestyle:  married;  Exercise:  no regular exercise;  Occupation:  Doctor, hospital works for [employer];  Residence:  lives with husband and children;   ____________________________ REVIEW OF SYSTEMS General:  voluntary weight loss of approximately 30 lbs  Integumentary:no rashes or new skin lesions. Eyes: glaucoma Ears, Nose, Throat, Mouth:  denies any hearing loss, epistaxis, hoarseness or difficulty speaking. Respiratory: denies dyspnea, cough, wheezing or hemoptysis. Cardiovascular:  please review HPI Abdominal: denies dyspepsia, GI bleeding, constipation, or diarrheaGenitourinary-Female: no dysuria, urgency, frequency, UTIs, or stress incontinence Musculoskeletal:  arthritis of the hands Neurological:  history of headaches Psychiatric:  anxiety disorder  Hematological/Immunologic:  denies any food allergies, bleeding disorders. ____________________________ PHYSICAL EXAMINATION VITAL SIGNS  Blood Pressure:  104/80 Sitting, Right arm,  regular cuff  , 104/78 Standing, Right arm and regular cuff   Pulse:  76/min. Weight:  179.00 lbs. Height:  65"BMI: 30  Constitutional:  pleasant white female, in no acute distress Skin:  Mulitiple tattoos both arms, leg and back Head:  normocephalic, normal hair pattern, no masses or tenderness Eyes:  EOMS Intact, PERRLA, C and S clear, Funduscopic exam not done. ENT:  ears, nose and throat reveal no gross abnormalities.  Dentition good. Neck:  supple, without massess. No JVD, thyromegaly or carotid bruits. Carotid upstroke normal. Chest:  normal symmetry, clear to auscultation and percussion. Cardiac:  regular rhythm, normal S1 and S2, No S3 or S4, no murmurs, gallops or rubs detected. Abdomen:  abdomen soft,non-tender, no masses, no hepatospenomegaly, or aneurysm noted Peripheral Pulses:  the femoral,dorsalis pedis, and posterior tibial pulses are full and equal bilaterally with no bruits auscultated. Extremities & Back:  no deformities, clubbing, cyanosis, erythema or edema observed. Normal muscle strength and tone. Neurological:  no gross motor or sensory deficits noted, affect appropriate, oriented x3. ____________________________ IMPRESSIONS/PLAN  1. Chest pain in a patient with multiple risk factors with some atypical features that could be anginal or could be noncardiac 2. Type 2 diabetes mellitus 3. Hyperlipidemia under treatment 4. Hypertension controlled 5. Overweight with need to lose additional weight 6. Anxiety and depression 7. History of chronic pancreatitis  Recommendations:  Obtain exercise treadmill test. We talked about smoking cessation and getting off off cigarettes completely. EKG is normal. ____________________________ TODAYS ORDERS  1. 12 Lead EKG: Today  2. treadmill:   Regular TM First Available                       ____________________________ Cardiology Physician:  Darden Palmer. MD [facility]

## 2012-10-31 ENCOUNTER — Other Ambulatory Visit: Payer: Self-pay | Admitting: Medical

## 2012-11-10 ENCOUNTER — Encounter: Payer: Self-pay | Admitting: Medical

## 2012-11-10 ENCOUNTER — Ambulatory Visit (INDEPENDENT_AMBULATORY_CARE_PROVIDER_SITE_OTHER): Payer: BC Managed Care – PPO | Admitting: Medical

## 2012-11-10 VITALS — BP 120/80 | HR 68 | Temp 98.2°F | Resp 16 | Wt 180.0 lb

## 2012-11-10 DIAGNOSIS — H9209 Otalgia, unspecified ear: Secondary | ICD-10-CM

## 2012-11-10 DIAGNOSIS — H9201 Otalgia, right ear: Secondary | ICD-10-CM

## 2012-11-10 DIAGNOSIS — R490 Dysphonia: Secondary | ICD-10-CM

## 2012-11-10 DIAGNOSIS — R0982 Postnasal drip: Secondary | ICD-10-CM

## 2012-11-10 MED ORDER — AMOXICILLIN 875 MG PO TABS: 875.0000 mg | ORAL_TABLET | Freq: Two times a day (BID) | ORAL | Status: DC

## 2012-11-10 NOTE — Progress Notes (Signed)
Subjective:  Sara Leon is a 47 y.o. female who presents for sore throat, hoarseness.   She notes that symptoms began about 3 weeks ago with sore throat, dry cough and headache.  Lasted a few days, but then had a few days of improvement, but then symptoms returned.   Now she notes 3 wk of ongoing throat pain, swollen glands, headache, fatigue, pain behind ears on the right, feels like back of tongue has sores that are swollen.  She is concerned about HPV in throat too.  Husband and her have genital warts, and she has performed oral sex on him.   She does have hx/o excessive alcohol use in the past including 2013, is a current smoker.   Using Ibuprofen for symptoms.  Denies sick contacts.  No other aggravating or relieving factors.  No other c/o.  Past Medical History  Diagnosis Date  . Anxiety   . GERD (gastroesophageal reflux disease)   . Depression   . 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 Pancreatitis chronic     on resection specimen  . Deep venous thrombosis of upper extremity 2011    due to PICC  . Pancreatic insufficiency 10/16/2010  . Fatty liver     appears improved on imaging after weight loss  . Diabetes mellitus 10/2008    diet controlled  . Hyperlipidemia   . Hypertension   . Condyloma acuminatum of vulva   . Osteoarthritis of hand     bilat 2nd and 3rd fingers at MCP, PIP  . Wears contact lenses   . Routine gynecological examination     Dr. Despina Hidden, [place]  . Bladder incontinence     Urology, pending studies 05/2012   ROS as in subjective  Objective: Filed Vitals:   11/10/12 1118  BP: 120/80  Pulse: 68  Temp: 98.2 F (36.8 C)  Resp: 16    General appearance: Alert, WD/WN, no distress, fatigue appearing                             Skin: warm, no rash                           Head: no sinus tenderness                            Eyes: conjunctiva normal, corneas clear, PERRLA                            Ears:  pearly TMs, external ear canals normal                          Nose: septum midline, turbinates swollen, with erythema and no discharge             Mouth/throat: MMM, tongue normal, mild pharyngeal erythema, + pnd                           Neck: supple, tender shoddy right anterior cervical lymph nodes, no thyromegaly, nontender, no mass                          Heart: RRR, normal S1, S2, no murmurs  Lungs: CTA bilaterally, no wheezes, rales, or rhonchi      Assessment and Plan:   Encounter Diagnoses  Name Primary?  . Hoarseness Yes  . Post-nasal drip   . Otalgia of right ear     Prescription given for Amoxicillin.  Discussed symptomatic relief, nasal saline, and call or return if worse or not improving in 2-3 days.  Call back 2wk.  If still having hoarseness at throat time or if not completely back to normal, may need referral for bronchoscopy and possible EGD.

## 2012-11-11 ENCOUNTER — Ambulatory Visit: Payer: BC Managed Care – PPO | Admitting: Medical

## 2012-11-13 ENCOUNTER — Telehealth: Payer: Self-pay | Admitting: Medical

## 2012-11-13 ENCOUNTER — Ambulatory Visit
Admission: RE | Admit: 2012-11-13 | Discharge: 2012-11-13 | Disposition: A | Discharge status: Home / Self Care | Payer: BC Managed Care – PPO | Source: Ambulatory Visit

## 2012-11-13 DIAGNOSIS — Z1231 Encounter for screening mammogram for malignant neoplasm of breast: Secondary | ICD-10-CM

## 2012-11-17 ENCOUNTER — Other Ambulatory Visit: Payer: Self-pay | Admitting: Family Medicine

## 2012-11-17 ENCOUNTER — Other Ambulatory Visit: Payer: Self-pay | Admitting: Medical

## 2012-11-17 DIAGNOSIS — N63 Unspecified lump in unspecified breast: Secondary | ICD-10-CM

## 2012-11-17 DIAGNOSIS — Z1231 Encounter for screening mammogram for malignant neoplasm of breast: Secondary | ICD-10-CM

## 2012-11-17 DIAGNOSIS — N644 Mastodynia: Secondary | ICD-10-CM

## 2012-11-17 NOTE — Telephone Encounter (Signed)
Patient is aware of her mammogram on November 27, 2012 @ 230 pm at [facility]. CLS [phone]

## 2012-11-17 NOTE — Telephone Encounter (Signed)
Patient states that it is on her left side. She said that she say GYN and they are aware. She had an appointment at [facility] for a regular mammogram but the told her to contact her primary care to get a diagnostic mammogram order. Can we do this? CLS

## 2012-11-17 NOTE — Telephone Encounter (Signed)
Then set up left diagnostic, right screening mammogram

## 2012-11-17 NOTE — Telephone Encounter (Signed)
Which breast has the pain, any lumps?  Since she sees gynecology, she may want to run this by them first.    I show last screening mammogram 2012, is that correct?

## 2012-11-19 ENCOUNTER — Telehealth: Payer: Self-pay | Admitting: Internal Medicine

## 2012-11-19 ENCOUNTER — Other Ambulatory Visit: Payer: Self-pay | Admitting: Medical

## 2012-11-19 DIAGNOSIS — J329 Chronic sinusitis, unspecified: Secondary | ICD-10-CM

## 2012-11-19 MED ORDER — AZITHROMYCIN 250 MG PO TABS: ORAL_TABLET | ORAL | Status: DC

## 2012-11-19 NOTE — Telephone Encounter (Signed)
Pt called stating that she is not any better after the amoxicillin she still has sore throat, hoarness, swollen glands

## 2012-11-19 NOTE — Telephone Encounter (Signed)
Different round of antibiotic sent

## 2012-11-20 ENCOUNTER — Other Ambulatory Visit: Payer: Self-pay

## 2012-11-20 NOTE — Telephone Encounter (Signed)
CALLED IN PER SHANE

## 2012-11-20 NOTE — Telephone Encounter (Signed)
IS THIS OK 

## 2012-11-20 NOTE — Telephone Encounter (Signed)
CALLED IN Sara Leon

## 2012-11-20 NOTE — Telephone Encounter (Signed)
Pt was called and informed

## 2012-11-20 NOTE — Telephone Encounter (Signed)
DONE

## 2012-11-21 ENCOUNTER — Telehealth: Payer: Self-pay | Admitting: Medical

## 2012-11-21 NOTE — Telephone Encounter (Signed)
PT CALLED AND STATED SHE IS WORSE. SHE SAID THAT YOU TOLD HER SHE MAY HAVE TO GO TO A SPECIALIST AND SHE WOULD LIKE THAT REFERRAL DONE. SHE STATES THE GLAND IN HER NECK ARE SWOLLEN. PLEASE CALL PT.

## 2012-11-21 NOTE — Telephone Encounter (Signed)
I spoke with the patient and she still having the same type of Sx. So I gave her, her appointment to see Dr. Ezzard Standing on 11/24/12 @ 330 pm. CLS

## 2012-11-21 NOTE — Telephone Encounter (Signed)
If still having hoarseness and head congestion symptoms, can refer to ENT.

## 2012-11-27 ENCOUNTER — Ambulatory Visit
Admission: RE | Admit: 2012-11-27 | Discharge: 2012-11-27 | Disposition: A | Discharge status: Home / Self Care | Payer: BC Managed Care – PPO | Source: Ambulatory Visit | Attending: Medical | Admitting: Medical

## 2012-11-27 ENCOUNTER — Other Ambulatory Visit: Payer: Self-pay | Admitting: Medical

## 2012-11-27 DIAGNOSIS — N644 Mastodynia: Secondary | ICD-10-CM

## 2012-11-27 DIAGNOSIS — N63 Unspecified lump in unspecified breast: Secondary | ICD-10-CM

## 2012-12-01 ENCOUNTER — Telehealth: Payer: Self-pay | Admitting: Internal Medicine

## 2012-12-01 NOTE — Telephone Encounter (Signed)
Call and see about her concerns.  I'm not sure I can change anything about the timing.  Dr. Ezzard Standing is a good ENT, but I can't force him to do a scope now vs in a week or so.

## 2012-12-01 NOTE — Telephone Encounter (Signed)
Pt states she is not happy with who we referred her to which is Dr. Ezzard Standing. She was told that she had a nodule/tumor and that he keeps putting looking at it off and tells her he will see her again in a week and she has no relieve she states she has had a sore throat, ear aches for 8 weeks and no relieve and she wants to talk to you about this. So can you give her a call

## 2012-12-02 NOTE — Telephone Encounter (Signed)
LMOM TO CB. CLS 

## 2012-12-30 ENCOUNTER — Other Ambulatory Visit: Payer: Self-pay | Admitting: Medical

## 2013-01-02 ENCOUNTER — Ambulatory Visit: Payer: BC Managed Care – PPO | Admitting: Medical

## 2013-01-13 ENCOUNTER — Other Ambulatory Visit: Payer: Self-pay | Admitting: Medical

## 2013-01-19 ENCOUNTER — Other Ambulatory Visit: Payer: Self-pay | Admitting: Medical

## 2013-01-19 NOTE — Telephone Encounter (Signed)
I called the patients medication out to the pharmacy per Crosby Oyster PA-C. CLS

## 2013-01-19 NOTE — Telephone Encounter (Signed)
Is okay to refill the Xanax? 

## 2013-01-20 NOTE — Telephone Encounter (Signed)
Is this okay to refill? 

## 2013-01-27 ENCOUNTER — Other Ambulatory Visit: Payer: Self-pay | Admitting: Medical

## 2013-01-29 ENCOUNTER — Other Ambulatory Visit: Payer: Self-pay | Admitting: Medical

## 2013-02-09 ENCOUNTER — Encounter: Payer: Self-pay | Admitting: Family Medicine

## 2013-02-09 ENCOUNTER — Ambulatory Visit (INDEPENDENT_AMBULATORY_CARE_PROVIDER_SITE_OTHER): Payer: BC Managed Care – PPO | Admitting: Family Medicine

## 2013-02-09 VITALS — BP 116/70 | HR 65 | Ht 65.5 in | Wt 202.0 lb

## 2013-02-09 DIAGNOSIS — Z5181 Encounter for therapeutic drug level monitoring: Secondary | ICD-10-CM

## 2013-02-09 DIAGNOSIS — E119 Type 2 diabetes mellitus without complications: Secondary | ICD-10-CM

## 2013-02-09 DIAGNOSIS — Z23 Encounter for immunization: Secondary | ICD-10-CM

## 2013-02-09 DIAGNOSIS — Z79899 Other long term (current) drug therapy: Secondary | ICD-10-CM

## 2013-02-09 DIAGNOSIS — E785 Hyperlipidemia, unspecified: Secondary | ICD-10-CM

## 2013-02-09 DIAGNOSIS — F418 Other specified anxiety disorders: Secondary | ICD-10-CM

## 2013-02-09 DIAGNOSIS — I10 Essential (primary) hypertension: Secondary | ICD-10-CM

## 2013-02-09 DIAGNOSIS — F341 Dysthymic disorder: Secondary | ICD-10-CM

## 2013-02-09 LAB — LIPID PANEL
Cholesterol: 160 mg/dL (ref 0–200)
HDL: 33 mg/dL — ABNORMAL LOW (ref >39)
Total CHOL/HDL Ratio: 4.8 Ratio
Triglycerides: 279 mg/dL — ABNORMAL HIGH (ref <150)
VLDL: 56 mg/dL — ABNORMAL HIGH (ref 0–40)

## 2013-02-09 LAB — POCT UA - MICROALBUMIN

## 2013-02-09 LAB — COMPLETE METABOLIC PANEL WITH GFR
AST: 12 U/L (ref 0–37)
Alkaline Phosphatase: 62 U/L (ref 39–117)
BUN: 9 mg/dL (ref 6–23)
Creat: 0.71 mg/dL (ref 0.50–1.10)
GFR, Est Non African American: >89 mL/min
Potassium: 4.8 mEq/L (ref 3.5–5.3)
Total Bilirubin: 0.4 mg/dL (ref 0.3–1.2)

## 2013-02-09 NOTE — Progress Notes (Signed)
CC: Sara Leon is a 47 y.o. 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 She's been taking 500 mg metformin twice a day. She reports she's had some blurry vision and has noticed that blood sugars have been as high as 300 during the day, vision seems to improve as blood sugar is lower. She has been to a diabetic educator in the past she feels that she has the tools and knowledge to bring her blood sugar and weight down she is about to start a daily exercise program today. Denies motor or sensory disturbances other than above. Denies poorly healing wounds. She sees an eye doctor regularly. She takes a statin  Essential hypertension: Patient reports blood pressure has been controlled for the past months on atenolol. She denies dizziness, lightheadedness, nor irregular heartbeat or fatigue.  Patient reports a history of hyperlipidemia she's been taking Pravachol on a daily basis for the last year plus. She denies myalgias, right upper quadrant pain, skin or scleral discoloration.  Depression with anxiety: Patient reports her spirits are quite happy right now she feels that she is well controlled on Effexor and as needed alprazolam. Denies thoughts of wanting to harm herself or others  Review of Systems - General ROS: negative for - chills, fever, night sweats, weight gain or weight loss Ophthalmic ROS: negative for - decreased vision Psychological ROS: negative for - mental disturbance ENT ROS: negative for - hearing change, nasal congestion, tinnitus or allergies Hematological and Lymphatic ROS: negative for - bleeding problems, bruising or swollen lymph nodes Breast ROS: negative Respiratory ROS: no cough, shortness of breath, or wheezing Cardiovascular ROS: no chest pain or dyspnea on exertion Gastrointestinal  ROS: no abdominal pain, change in bowel habits, or black or bloody stools Genito-Urinary ROS: negative for - genital discharge, genital ulcers, incontinence or abnormal bleeding from genitals Musculoskeletal ROS: negative for - joint pain or muscle pain Neurological ROS: negative for - headaches or memory loss Dermatological ROS: negative for lumps, mole changes, rash and skin lesion changes  Past Medical History  Diagnosis Date  . Anxiety   . GERD (gastroesophageal reflux disease)   . Depression   . 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 Pancreatitis chronic     on resection specimen  . Deep venous thrombosis of upper extremity 2011    due to PICC  . Pancreatic insufficiency 10/16/2010  . Fatty liver     appears improved on imaging after weight loss  . Diabetes mellitus 10/2008    diet controlled  . Hyperlipidemia   . Hypertension   . Condyloma acuminatum of vulva   . Osteoarthritis of hand     bilat 2nd and 3rd fingers at MCP, PIP  . Wears contact lenses   . Routine gynecological examination     Dr. Despina Hidden, [place]  . Bladder incontinence     Urology, pending studies 05/2012     Family History  Problem Relation Age of Onset  . Crohn's disease      nephew  . Colon cancer Maternal Grandmother   . Cancer Maternal Grandmother     colon  . Cancer Mother 56    died of melanoma  . Hypertension Father   . Heart disease Father     MI  . Hyperlipidemia Father   .  Other Father     died in MVA  . Cancer Maternal Grandfather     colon  . Heart disease Paternal Grandfather      History  Substance Use Topics  . Smoking status: Former Smoker -- 1.00 packs/day  . Smokeless tobacco: Never Used  . Alcohol Use: No     Comment: started AA 05/2012.  Last drink early January     Objective: Filed Vitals:   02/09/13 0917  BP: 116/70  Pulse: 65    General: Alert and Oriented, No Acute Distress HEENT: Pupils equal, round, reactive  to light. Conjunctivae clear.  External ears unremarkable, canals clear with intact TMs with appropriate landmarks.  Middle ear appears open without effusion. Pink inferior turbinates.  Moist mucous membranes, pharynx without inflammation nor lesions.   Lungs: Clear to auscultation bilaterally, no wheezing/ronchi/rales.  Comfortable work of breathing. Good air movement. Cardiac: Regular rate and rhythm. Normal S1/S2.  No murmurs, rubs, nor gallops.   Abdomen: Soft obese Extremities: No peripheral edema.  Strong peripheral pulses.  Mental Status: No depression, anxiety, nor agitation. Skin: Warm and dry.  Assessment & Plan: Sara Leon was seen today for establish care and discuss diet.  Diagnoses and associated orders for this visit:  Essential hypertension - COMPLETE METABOLIC PANEL WITH GFR  Type II or unspecified type diabetes mellitus without mention of complication, not stated as uncontrolled - Hemoglobin A1c  Depression with anxiety  Hyperlipidemia LDL goal < 100 - Lipid panel  Encounter for monitoring statin therapy - COMPLETE METABOLIC PANEL WITH GFR    Essential hypertension: Controlled continue atenolol Type 2 diabetes: Uncontrolled we will check A1c if this is above 7 will increase metformin 1 g twice a day, urine microalbumin is normal Hyperlipidemia: Due for cholesterol panel: LDL less than 100 continue Pravachol, checking liver enzymes Depression and anxiety: Controlled and stable continue Effexor and as needed alprazolam  She received flu shot today     Return in about 3 months (around 05/11/2013).

## 2013-02-10 ENCOUNTER — Telehealth: Payer: Self-pay | Admitting: Family Medicine

## 2013-02-10 DIAGNOSIS — E119 Type 2 diabetes mellitus without complications: Secondary | ICD-10-CM

## 2013-02-10 MED ORDER — METFORMIN HCL 500 MG PO TABS: ORAL_TABLET | ORAL | Status: DC

## 2013-02-10 NOTE — Telephone Encounter (Signed)
Sue Lush, Will you please let Sara Leon know that her A1c was 7.1 with a goal of less than 7.  I would encourage her to go up on her metformin to a full 1,000mg  at breakfast and with dinner, I've sent an updated Rx [facility].  Kidney and liver function were perfect.  LDL cholesterol looks great and is at goal so I'd encourage her to continue with her pravastatin.  Triglycerides were elevated however lowering blood sugar with metformin will bring these down. I'd encourage f/u in 3 months.

## 2013-02-11 NOTE — Telephone Encounter (Signed)
Pt.notified

## 2013-02-23 ENCOUNTER — Other Ambulatory Visit: Payer: Self-pay | Admitting: Medical

## 2013-02-24 NOTE — Telephone Encounter (Signed)
Sara Leon IS THIS OK

## 2013-03-03 ENCOUNTER — Encounter: Payer: Self-pay | Admitting: Family Medicine

## 2013-03-03 ENCOUNTER — Ambulatory Visit (INDEPENDENT_AMBULATORY_CARE_PROVIDER_SITE_OTHER): Payer: BC Managed Care – PPO | Admitting: Family Medicine

## 2013-03-03 VITALS — BP 122/80 | HR 64 | Wt 198.0 lb

## 2013-03-03 DIAGNOSIS — M549 Dorsalgia, unspecified: Secondary | ICD-10-CM

## 2013-03-03 MED ORDER — HYDROCODONE-ACETAMINOPHEN 5-325 MG PO TABS: 1.0000 | ORAL_TABLET | Freq: Three times a day (TID) | ORAL | Status: DC | PRN

## 2013-03-03 MED ORDER — CYCLOBENZAPRINE HCL 10 MG PO TABS: ORAL_TABLET | ORAL | Status: DC

## 2013-03-03 NOTE — Progress Notes (Signed)
CC: Sara Leon is a 47 y.o. female is here for Back Pain   Subjective: HPI:  Patient presents of low back pain is localized to the left and right lower back just above the pelvis came on gradually Saturday morning while resting after cleaning her house. Symptoms have worsened up until Monday now moderate in severity pain is nonradiating worse with extension or sudden movements. Has never had this before ibuprofen has not helped. She denies midline back pain bowel or bladder dysfunction nor saddle paresthesia. Denies weakness in extremities. Pain is described only as pain. Denies abdominal pain, dysuria, constipation, diarrhea, flank pain  Review Of Systems Outlined In HPI  Past Medical History  Diagnosis Date  . Anxiety   . GERD (gastroesophageal reflux disease)   . Depression   . 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 Pancreatitis chronic     on resection specimen  . Deep venous thrombosis of upper extremity 2011    due to PICC  . Pancreatic insufficiency 10/16/2010  . Fatty liver     appears improved on imaging after weight loss  . Diabetes mellitus 10/2008    diet controlled  . Hyperlipidemia   . Hypertension   . Condyloma acuminatum of vulva   . Osteoarthritis of hand     bilat 2nd and 3rd fingers at MCP, PIP  . Wears contact lenses   . Routine gynecological examination     Dr. Despina Hidden, [place]  . Bladder incontinence     Urology, pending studies 05/2012     Family History  Problem Relation Age of Onset  . Crohn's disease      nephew  . Colon cancer Maternal Grandmother   . Cancer Maternal Grandmother     colon  . Cancer Mother 53    died of melanoma  . Hypertension Father   . Heart disease Father     MI  . Hyperlipidemia Father   . Other Father     died in MVA  . Cancer Maternal Grandfather     colon  . Heart disease Paternal Grandfather      History  Substance Use Topics  . Smoking status: Former Smoker --  1.00 packs/day  . Smokeless tobacco: Never Used  . Alcohol Use: No     Comment: started AA 05/2012.  Last drink early January     Objective: Filed Vitals:   03/03/13 0934  BP: 122/80  Pulse: 64    General: Alert and Oriented, No Acute Distress HEENT: Pupils equal, round, reactive to light. Conjunctivae clear.  Moist because membranes Lungs: Clear to auscultation bilaterally, no wheezing/ronchi/rales.  Comfortable work of breathing. Good air movement. Cardiac: Regular rate and rhythm. Normal S1/S2.  No murmurs, rubs, nor gallops.   Back: No midline spinous process tenderness of the lumbar spine, pain is reproduced with paraspinal musculature on the left and right lower lumbar musculature no overlying rashes negative stork test bilaterally Extremities: No peripheral edema.  Strong peripheral pulses. Full range of motion strength in both lower extremities S1-L4 DTR two over four bilaterally. Negative logroll, negative straight leg raise, negative FABER/FADIR bilaterally Mental Status: No depression, anxiety, nor agitation. Skin: Warm and dry.  Assessment & Plan: Sara Leon was seen today for back pain.  Diagnoses and associated orders for this visit:  Back pain - cyclobenzaprine (FLEXERIL) 10 MG tablet; Take a half to a full tab every 8-12 hours only as needed for muscle spasm and back pain,  may cause sedation. - HYDROcodone-acetaminophen (NORCO/VICODIN) 5-325 MG per tablet; Take 1 tablet by mouth every 8 (eight) hours as needed for pain.    Back pain: Discussed with patient most likely muscular strain therefore start cyclobenzaprine continue ibuprofen 800 mg 3 times a day and may use as needed Vicodin to help get through home exercise plan.Signs and symptoms requring emergent/urgent reevaluation were discussed with the patient.  25 minutes spent face-to-face during visit today of which at least 50% was counseling or coordinating care regarding back pain.   Return if symptoms worsen or  fail to improve.

## 2013-03-16 ENCOUNTER — Other Ambulatory Visit: Payer: Self-pay | Admitting: Medical

## 2013-03-16 NOTE — Telephone Encounter (Signed)
She said that she was see a back specialists in [place] but she will still see you for everything else. CLS

## 2013-03-16 NOTE — Telephone Encounter (Signed)
IS THIS OKAY TO REFILL 

## 2013-03-16 NOTE — Telephone Encounter (Signed)
I received refill request on Effexor, but chart shows that she has switched to different provider.   Please inquire?  Did she move, was she unhappy with her care here?

## 2013-03-23 ENCOUNTER — Other Ambulatory Visit: Payer: Self-pay | Admitting: Medical

## 2013-03-23 NOTE — Telephone Encounter (Signed)
IS THIS OKAY TO REFILL 

## 2013-03-24 ENCOUNTER — Telehealth: Payer: Self-pay | Admitting: Medical

## 2013-03-24 MED ORDER — VENLAFAXINE HCL ER 75 MG PO CP24: 75.0000 mg | ORAL_CAPSULE | ORAL | Status: DC

## 2013-03-24 MED ORDER — VENLAFAXINE HCL ER 150 MG PO CP24: 150.0000 mg | ORAL_CAPSULE | Freq: Every day | ORAL | Status: DC

## 2013-03-24 MED ORDER — ALPRAZOLAM 1 MG PO TABS: 1.0000 mg | ORAL_TABLET | Freq: Two times a day (BID) | ORAL | Status: DC

## 2013-03-24 NOTE — Telephone Encounter (Signed)
pls call her the office where she recently visited and verify whether she is just coming to them for back issues or has she truly changed providers?

## 2013-03-24 NOTE — Telephone Encounter (Signed)
Per Vincenza Hews ok to refill Xanax & Effexor x 1 month.  Pt informed

## 2013-03-25 ENCOUNTER — Telehealth: Payer: Self-pay | Admitting: *Deleted

## 2013-03-25 NOTE — Telephone Encounter (Signed)
I called and I left a message for Dr. Genelle Bal nurse to call me back about this patient. CLS

## 2013-03-25 NOTE — Telephone Encounter (Signed)
Sara Leon at [facility] to let her know that pt has established care here and per the pt at her visit Dr. Ivan Anchors is her PCP now.

## 2013-04-01 ENCOUNTER — Ambulatory Visit: Payer: BC Managed Care – PPO | Admitting: Medical

## 2013-04-14 ENCOUNTER — Other Ambulatory Visit: Payer: Self-pay | Admitting: Medical

## 2013-04-14 NOTE — Telephone Encounter (Signed)
Needs appt. The other office said per our call that she established care there for primary care, and reportedly their doc is not a back specialist.

## 2013-04-14 NOTE — Telephone Encounter (Signed)
IS THIS OKAY TO REFILL 

## 2013-04-20 ENCOUNTER — Other Ambulatory Visit: Payer: Self-pay | Admitting: Medical

## 2013-04-22 ENCOUNTER — Other Ambulatory Visit: Payer: Self-pay | Admitting: Family Medicine

## 2013-04-22 MED ORDER — VENLAFAXINE HCL ER 150 MG PO CP24: 150.0000 mg | ORAL_CAPSULE | Freq: Every day | ORAL | Status: DC

## 2013-04-22 MED ORDER — ALPRAZOLAM 1 MG PO TABS: 1.0000 mg | ORAL_TABLET | Freq: Two times a day (BID) | ORAL | Status: DC

## 2013-04-22 NOTE — Telephone Encounter (Signed)
I called out both medications per Crosby Oyster PA-c. CLS

## 2013-04-22 NOTE — Telephone Encounter (Signed)
Refill her meds x 30 days pls

## 2013-04-22 NOTE — Telephone Encounter (Signed)
Patient states that she has not establish care any where else. She states she wants to keep seeing you because you know her history. She still needs refills Effexor 150 and her xanax. She said she can schedule an appointment to come in and see you over Christmas break. CLS [phone]

## 2013-04-22 NOTE — Telephone Encounter (Signed)
I called both medications to the pharmacy per Crosby Oyster PA-C. CLS

## 2013-04-27 ENCOUNTER — Other Ambulatory Visit: Payer: Self-pay | Admitting: Medical

## 2013-04-27 DIAGNOSIS — N6320 Unspecified lump in the left breast, unspecified quadrant: Secondary | ICD-10-CM

## 2013-05-08 ENCOUNTER — Ambulatory Visit: Payer: BC Managed Care – PPO | Admitting: Family Medicine

## 2013-05-11 ENCOUNTER — Other Ambulatory Visit: Payer: Self-pay | Admitting: Medical

## 2013-05-11 NOTE — Telephone Encounter (Signed)
Is this okay to refill? I thought she had an appt. On the schedule but I don't see one.

## 2013-05-12 ENCOUNTER — Other Ambulatory Visit: Payer: Self-pay | Admitting: Medical

## 2013-05-20 ENCOUNTER — Other Ambulatory Visit: Payer: Self-pay | Admitting: Medical

## 2013-05-21 NOTE — Telephone Encounter (Signed)
Is this okay to refill? 

## 2013-05-25 ENCOUNTER — Other Ambulatory Visit: Payer: Self-pay | Admitting: Family Medicine

## 2013-05-25 ENCOUNTER — Telehealth: Payer: Self-pay | Admitting: Medical

## 2013-05-25 MED ORDER — ALPRAZOLAM 1 MG PO TABS: 1.0000 mg | ORAL_TABLET | Freq: Two times a day (BID) | ORAL | Status: DC

## 2013-05-25 MED ORDER — VENLAFAXINE HCL ER 75 MG PO CP24: 75.0000 mg | ORAL_CAPSULE | Freq: Every day | ORAL | Status: DC

## 2013-05-25 MED ORDER — VENLAFAXINE HCL ER 150 MG PO CP24: 150.0000 mg | ORAL_CAPSULE | Freq: Every day | ORAL | Status: DC

## 2013-05-25 NOTE — Telephone Encounter (Signed)
Call in whatever she is completley out of

## 2013-05-25 NOTE — Telephone Encounter (Signed)
Pt made appt for tomorrow at 2:45. She wants to ask if Sara Leon will give refill for today since pt has been without med since Friday even though she is coming in for appt tomorrow

## 2013-05-25 NOTE — Telephone Encounter (Signed)
i last saw her in June, so yes regarding anxiety, mood, medication, she is due for follow up and for those medications need to see somewhat regularly, q4-6 mo  pls schedule.

## 2013-05-25 NOTE — Telephone Encounter (Signed)
I called out her 3 medications to her pharmacy per Chana Bode PA-C. CLS

## 2013-05-26 ENCOUNTER — Other Ambulatory Visit: Payer: Self-pay | Admitting: Family Medicine

## 2013-05-26 ENCOUNTER — Encounter: Payer: Self-pay | Admitting: Family Medicine

## 2013-05-26 ENCOUNTER — Ambulatory Visit: Payer: BC Managed Care – PPO | Admitting: Medical

## 2013-05-26 ENCOUNTER — Telehealth: Payer: Self-pay | Admitting: Family Medicine

## 2013-05-26 ENCOUNTER — Ambulatory Visit (INDEPENDENT_AMBULATORY_CARE_PROVIDER_SITE_OTHER): Payer: BC Managed Care – PPO | Admitting: Family Medicine

## 2013-05-26 VITALS — BP 153/93 | HR 78 | Wt 198.0 lb

## 2013-05-26 DIAGNOSIS — I10 Essential (primary) hypertension: Secondary | ICD-10-CM

## 2013-05-26 DIAGNOSIS — J209 Acute bronchitis, unspecified: Secondary | ICD-10-CM

## 2013-05-26 DIAGNOSIS — E119 Type 2 diabetes mellitus without complications: Secondary | ICD-10-CM

## 2013-05-26 LAB — POCT GLYCOSYLATED HEMOGLOBIN (HGB A1C): Hemoglobin A1C: 6.1

## 2013-05-26 MED ORDER — METFORMIN HCL 500 MG PO TABS: ORAL_TABLET | ORAL | Status: DC

## 2013-05-26 MED ORDER — AZITHROMYCIN 250 MG PO TABS: ORAL_TABLET | ORAL | Status: AC

## 2013-05-26 NOTE — Telephone Encounter (Signed)
I spoke with Sara Leon. Tysinger following her appointment with me today and joint decision was made that based on what was verbalized today and in the recent past (by the patient) will count for a official transfer of primary care services.  Our office will now be responsible for counseling her on her medications.

## 2013-05-26 NOTE — Telephone Encounter (Signed)
Dorothea Ogle can be reached at [phone]

## 2013-05-26 NOTE — Progress Notes (Signed)
CC: Sara Leon is a 48 y.o. female is here for Diabetes   Subjective: HPI:  Followup type 2 diabetes: Since I saw her last she increased metformin she is now taking 1 g in the morning and 500 mg at dinnertime without any known side effects denies GI disturbance. There has been no nausea, vomiting, abdominal pain, diarrhea nor constipation. She states she has tried hard over the holiday season to limit her carbohydrate intake. No change in exercise routine. Denies polyuria polyphasia polydipsia or vision loss  Followup essential hypertension no outside blood pressures to report she states that she did not take atenolol today. There has been no chest pain, shortness of breath, motor or sensory disturbances  She complains of a cough that has been present for the past 6 weeks if persistent on daily basis mild to moderate in severity present all hours of the day interferes with sleep if she does not take NyQuil nothing else makes better or worse other than NyQuil. She stopped smoking for 2 weeks cough did not get better or worse. Accompanied by subjective chills over the past week without fevers. Denies nasal congestion, facial pain, shortness of breath but doesn't orders subjective mild wheezing  She's requesting refills on venlafaxine and Xanax states that her anxiety is currently well controlled on her long-standing regimen of the above   Review Of Systems Outlined In HPI  Past Medical History  Diagnosis Date  . Anxiety   . GERD (gastroesophageal reflux disease)   . Depression   . 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 Pancreatitis chronic     on resection specimen  . Deep venous thrombosis of upper extremity 2011    due to PICC  . Pancreatic insufficiency 10/16/2010  . Fatty liver     appears improved on imaging after weight loss  . Diabetes mellitus 10/2008    diet controlled  . Hyperlipidemia   . Hypertension   . Condyloma acuminatum  of vulva   . Osteoarthritis of hand     bilat 2nd and 3rd fingers at MCP, PIP  . Wears contact lenses   . Routine gynecological examination     Dr. Elonda Husky, [facility]  . Bladder incontinence     Urology, pending studies 05/2012     Family History  Problem Relation Age of Onset  . Crohn's disease      nephew  . Colon cancer Maternal Grandmother   . Cancer Maternal Grandmother     colon  . Cancer Mother 80    died of melanoma  . Hypertension Father   . Heart disease Father     MI  . Hyperlipidemia Father   . Other Father     died in [place]  . Cancer Maternal Grandfather     colon  . Heart disease Paternal Grandfather      History  Substance Use Topics  . Smoking status: Former Smoker -- 1.00 packs/day  . Smokeless tobacco: Never Used  . Alcohol Use: No     Comment: started AA 05/2012.  Last drink early January     Objective: Filed Vitals:   05/26/13 1334  BP: 153/93  Pulse: 78    General: Alert and Oriented, No Acute Distress HEENT: Pupils equal, round, reactive to light. Conjunctivae clear.  External ears unremarkable, canals clear with intact TMs with appropriate landmarks.  Middle ear appears open without effusion. Pink inferior turbinates.  Moist mucous membranes, pharynx without inflammation nor  lesions.  Neck supple without palpable lymphadenopathy nor abnormal masses. Lungs: Comfortable work of breathing with trace end expiratory rhonchi centrally without wheezing nor rales nor signs of consolidation  Cardiac: Regular rate and rhythm. Normal S1/S2.  No murmurs, rubs, nor gallops.   Extremities: No peripheral edema.  Strong peripheral pulses.  Mental Status: No depression, anxiety, nor agitation. Skin: Warm and dry.  Assessment & Plan: Jackelynn was seen today for diabetes.  Diagnoses and associated orders for this visit:  Type II or unspecified type diabetes mellitus without mention of complication, not stated as uncontrolled - POCT HgB A1C  Essential  hypertension  DM2 (diabetes mellitus, type 2) - metFORMIN (GLUCOPHAGE) 500 MG tablet; Two tablets by mouth at breakfast and one by mouth at dinnertime.  Acute bronchitis - azithromycin (ZITHROMAX) 250 MG tablet; Take two tabs at once on day 1, then one tab daily on days 2-5.    Type 2 diabetes: Controlled with A1c of 6.1 today continue current metformin regimen recheck A1c 3 months Essential hypertension: Uncontrolled I've asked her to take her atenolol on a daily basis he'll be helpful to have home blood pressure readings after she gets back in a habit of taking his medication on a daily basis Acute bronchitis: Start azithromycin Anxiety and depression: Stable, I pointed out that there was a Effexor and Xanax request submitted to her former PCP Chana Bode earlier this week that was submitted but not yet picked up. Patient and I discussed that only one provider should be managing these medications and I would be more than willing to help starting in February if she was in agreement of allowing only myself to manage these medications.  Return in about 3 months (around 08/24/2013).

## 2013-05-26 NOTE — Progress Notes (Signed)
I spoke with David S. Tysinger following her appointment with me today and joint decision was made that based on what was verbalized today and in the recent past (by the patient) will count for a official transfer of primary care services.  Our office will now be responsible for counseling her on her medications. 

## 2013-05-26 NOTE — Telephone Encounter (Signed)
Dorothea Ogle, P.A called.  He wants to talk to you about Ms. Sieloff who is is coming in  see him in the next few minutes. He just wants to make sure she is not playing any games.

## 2013-05-30 ENCOUNTER — Other Ambulatory Visit: Payer: Self-pay | Admitting: Medical

## 2013-06-01 ENCOUNTER — Other Ambulatory Visit: Payer: BC Managed Care – PPO

## 2013-06-01 ENCOUNTER — Telehealth: Payer: Self-pay | Admitting: *Deleted

## 2013-06-01 ENCOUNTER — Other Ambulatory Visit: Payer: Self-pay | Admitting: Family Medicine

## 2013-06-01 MED ORDER — RISPERIDONE 1 MG PO TABS: ORAL_TABLET | ORAL | Status: DC

## 2013-06-01 NOTE — Telephone Encounter (Signed)
Rx has been sent to [facility] on file.

## 2013-06-01 NOTE — Telephone Encounter (Signed)
Pt left a message that she discussed with you that she could call when she needed a refill of risperdone. Please advise

## 2013-06-01 NOTE — Telephone Encounter (Signed)
Is this okay to refill? 

## 2013-06-02 NOTE — Telephone Encounter (Signed)
Pt.notified

## 2013-06-08 ENCOUNTER — Encounter: Payer: Self-pay | Admitting: Medical

## 2013-06-22 ENCOUNTER — Other Ambulatory Visit: Payer: Self-pay | Admitting: Medical

## 2013-06-22 ENCOUNTER — Telehealth: Payer: Self-pay | Admitting: *Deleted

## 2013-06-22 MED ORDER — VENLAFAXINE HCL ER 150 MG PO CP24: 150.0000 mg | ORAL_CAPSULE | Freq: Every day | ORAL | Status: DC

## 2013-06-22 MED ORDER — VENLAFAXINE HCL ER 75 MG PO CP24: 75.0000 mg | ORAL_CAPSULE | Freq: Every day | ORAL | Status: DC

## 2013-06-22 MED ORDER — ALPRAZOLAM 1 MG PO TABS: 1.0000 mg | ORAL_TABLET | Freq: Two times a day (BID) | ORAL | Status: DC

## 2013-06-22 NOTE — Telephone Encounter (Signed)
Pt called requesting a refill on xanax and effexor 75mg  and 150mg . Its about three days early.

## 2013-06-22 NOTE — Telephone Encounter (Signed)
Seth Bake, Rx placed in in-box ready for pickup/faxing.  (controlled sub DB checked)

## 2013-06-22 NOTE — Telephone Encounter (Signed)
rx faxed.  Oscar La, LPN

## 2013-06-25 ENCOUNTER — Ambulatory Visit
Admission: RE | Admit: 2013-06-25 | Discharge: 2013-06-25 | Disposition: A | Discharge status: Home / Self Care | Payer: BC Managed Care – PPO | Source: Ambulatory Visit | Attending: Medical | Admitting: Medical

## 2013-06-25 DIAGNOSIS — N6320 Unspecified lump in the left breast, unspecified quadrant: Secondary | ICD-10-CM

## 2013-06-26 ENCOUNTER — Ambulatory Visit (INDEPENDENT_AMBULATORY_CARE_PROVIDER_SITE_OTHER): Payer: BC Managed Care – PPO | Admitting: Family Medicine

## 2013-06-26 ENCOUNTER — Encounter: Payer: Self-pay | Admitting: Family Medicine

## 2013-06-26 VITALS — BP 136/91 | HR 75 | Temp 99.0°F | Wt 206.0 lb

## 2013-06-26 DIAGNOSIS — D179 Benign lipomatous neoplasm, unspecified: Secondary | ICD-10-CM

## 2013-06-26 DIAGNOSIS — J020 Streptococcal pharyngitis: Secondary | ICD-10-CM

## 2013-06-26 MED ORDER — AMOXICILLIN-POT CLAVULANATE 500-125 MG PO TABS: ORAL_TABLET | ORAL | Status: AC

## 2013-06-26 NOTE — Progress Notes (Signed)
CC: Sara Leon is a 48 y.o. female is here for Sore Throat   Subjective: HPI:  Patient placed sore throat that is moderate to severe in severity present for the past 2 days worsening on a daily basis nonradiating localized in the back of the throat. Worse with swallowing nothing seems to make it better. Accompanied by fevers, chills, and night sweats since the onset of pain. Denies difficulty swallowing other than pain. Denies rash, headache, myalgias, cough, shortness of breath, nor nasal congestion  Complains of a mass in her lower left back along the waistline that has been present for months however over the past weeks has become much more painful moderate in severity worse with wearing pants or anything around the waist. Improves with avoiding pressure to the site of pain. Pain is described only has pain nonradiating. There's been no change in her bowel habits. She denies recent or remote trauma   Review Of Systems Outlined In HPI  Past Medical History  Diagnosis Date  . Anxiety   . GERD (gastroesophageal reflux disease)   . Depression   . 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 Pancreatitis chronic     on resection specimen  . Deep venous thrombosis of upper extremity 2011    due to PICC  . Pancreatic insufficiency 10/16/2010  . Fatty liver     appears improved on imaging after weight loss  . Diabetes mellitus 10/2008    diet controlled  . Hyperlipidemia   . Hypertension   . Condyloma acuminatum of vulva   . Osteoarthritis of hand     bilat 2nd and 3rd fingers at [facility], PIP  . Wears contact lenses   . Routine gynecological examination     Dr. Elonda Husky, Linna Hoff  . Bladder incontinence     Urology, pending studies 05/2012    Past Surgical History  Procedure Laterality Date  . Pancreaticoduodenectomy  05/2009     pancreatic intrepithelial neoplasia types 1A and 1B (Dr. Eugenia Pancoast)  . Tonsillectomy  1971  . Cholecystectomy  2000   . Dobbhoff feeding tube  07/04/09    [facility]  . Upper gastrointestinal endoscopy  2005 and 2010 - [place], [place]    gastritis 2005 and 2010, retained food 2005, no H. pyloi and duodenal bxs normal  . Upper endoscopic ultrasound w/ fna  05/12/2009    Uncinate process mass - Dr. Jerene Pitch  . Pancreaticoduodenectomy  06/10/09    [facility], Dr. Eugenia Pancoast  . Abdominal hysterectomy  1987    Partial due to heavy bleeding, still has her ovaries   Family History  Problem Relation Age of Onset  . Crohn's disease      nephew  . Colon cancer Maternal Grandmother   . Cancer Maternal Grandmother     colon  . Cancer Mother 37    died of melanoma  . Hypertension Father   . Heart disease Father     MI  . Hyperlipidemia Father   . Other Father     died in [place]  . Cancer Maternal Grandfather     colon  . Heart disease Paternal Grandfather     History   Social History  . Marital Status: Married    Spouse Name: N/A    Number of Children: 1  . Years of Education: N/A   Occupational History  . 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 Smoking status: Former Smoker --  1.00 packs/day  . Smokeless tobacco: Never Used  . Alcohol Use: No     Comment: started AA 05/2012.  Last drink early January  . Drug Use: No  . Sexual Activity: Yes    Partners: Male   Other Topics Concern  . 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  Exercises at the [employer]     Objective: BP 136/91  Pulse 75  Temp(Src) 99 F (37.2 C) (Oral)  Wt 206 lb (93.441 kg)  General: Alert and Oriented, No Acute Distress HEENT: Pupils equal, round, reactive to light. Conjunctivae clear.  External ears unremarkable, canals clear with intact TMs with appropriate landmarks.  Middle ear appears open without effusion. Pink inferior turbinates.  Moist mucous membranes, pharynx with mild erythema .  2 right anterior  cervical chain tender lymph nodes approximately half-1 cm in diameter. Lungs: Clear to auscultation bilaterally, no wheezing/ronchi/rales.  Comfortable work of breathing. Good air movement. Cardiac: Regular rate and rhythm. Normal S1/S2.  No murmurs, rubs, nor gallops.   Mental Status: No depression, anxiety, nor agitation. Skin: Warm and dry. There is a approximate 2 cm tender lipoma just to the right of the left SI joint, she has half centimeter tender lipomas just lateral on the right and the left 2 the xiphoid process  Assessment & Plan: Catalina was seen today for sore throat.  Diagnoses and associated orders for this visit:  Lipoma - Ambulatory referral to General Surgery  Strep pharyngitis - amoxicillin-clavulanate (AUGMENTIN) 500-125 MG per tablet; Take one by mouth every 8 hours for ten total days.    Start Augmentin for strep pharyngitis along with ibuprofen and salt water gargles as needed Lipoma: Were for general surgery given the depth of lipoma and that it is painful  Return if symptoms worsen or fail to improve.

## 2013-06-28 ENCOUNTER — Emergency Department (INDEPENDENT_AMBULATORY_CARE_PROVIDER_SITE_OTHER)
Admission: EM | Admit: 2013-06-28 | Discharge: 2013-06-28 | Disposition: A | Discharge status: Home / Self Care | Payer: BC Managed Care – PPO | Source: Home / Self Care | Attending: Emergency Medicine | Admitting: Emergency Medicine

## 2013-06-28 ENCOUNTER — Encounter: Payer: Self-pay | Admitting: Emergency Medicine

## 2013-06-28 DIAGNOSIS — J209 Acute bronchitis, unspecified: Secondary | ICD-10-CM

## 2013-06-28 DIAGNOSIS — J069 Acute upper respiratory infection, unspecified: Secondary | ICD-10-CM

## 2013-06-28 MED ORDER — LEVOFLOXACIN 500 MG PO TABS: 500.0000 mg | ORAL_TABLET | Freq: Every day | ORAL | Status: DC

## 2013-06-28 MED ORDER — METHYLPREDNISOLONE SODIUM SUCC 125 MG IJ SOLR
125.0000 mg | Freq: Once | INTRAMUSCULAR | Status: AC
  Administered 2013-06-28: 125 mg via INTRAMUSCULAR

## 2013-06-28 NOTE — ED Provider Notes (Signed)
CSN: [id number]     Arrival date & time 06/28/13  1527 History   First MD Initiated Contact with Patient 06/28/13 1634     Chief Complaint  Patient presents with  . Otalgia    x 3 days  . Wheezing    x 3 days  . Cough    x 3 days     (Consider location/radiation/quality/duration/timing/severity/associated sxs/prior Treatment) HPI Sara Leon is a 48 y.o. female who complains of onset of cold symptoms for 3-4 days.  The symptoms are constant and mild-moderate in severity.  She saw her PCP 2 days ago who started Amox and now she is feeling worse.  Spreading to chest.  States she has a history of CAP.  Still smokes. + sore throat + cough No pleuritic pain + wheezing + nasal congestion + post-nasal drainage + sinus pain/pressure +chest congestion No itchy/red eyes + earache No hemoptysis No SOB + chills/sweats No fever No nausea No vomiting No abdominal pain No diarrhea No skin rashes + fatigue + myalgias + headache    Past Medical History  Diagnosis Date  . Anxiety   . GERD (gastroesophageal reflux disease)   . Depression   . 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 Pancreatitis chronic     on resection specimen  . Deep venous thrombosis of upper extremity 2011    due to PICC  . Pancreatic insufficiency 10/16/2010  . Fatty liver     appears improved on imaging after weight loss  . Diabetes mellitus 10/2008    diet controlled  . Hyperlipidemia   . Hypertension   . Condyloma acuminatum of vulva   . Osteoarthritis of hand     bilat 2nd and 3rd fingers at [facility], PIP  . Wears contact lenses   . Routine gynecological examination     Dr. Elonda Husky, Linna Hoff  . Bladder incontinence     Urology, pending studies 05/2012   Past Surgical History  Procedure Laterality Date  . Pancreaticoduodenectomy  05/2009     pancreatic intrepithelial neoplasia types 1A and 1B (Dr. Eugenia Pancoast)  . Tonsillectomy  1971  . Cholecystectomy  2000  .  Dobbhoff feeding tube  07/04/09    [facility]  . Upper gastrointestinal endoscopy  2005 and 2010 - [place], [place]    gastritis 2005 and 2010, retained food 2005, no H. pyloi and duodenal bxs normal  . Upper endoscopic ultrasound w/ fna  05/12/2009    Uncinate process mass - Dr. Jerene Pitch  . Pancreaticoduodenectomy  06/10/09    [facility], Dr. Eugenia Pancoast  . Abdominal hysterectomy  1987    Partial due to heavy bleeding, still has her ovaries   Family History  Problem Relation Age of Onset  . Crohn's disease      nephew  . Colon cancer Maternal Grandmother   . Cancer Maternal Grandmother     colon  . Cancer Mother 49    died of melanoma  . Hypertension Father   . Heart disease Father     MI  . Hyperlipidemia Father   . Other Father     died in [place]  . Cancer Maternal Grandfather     colon  . Heart disease Paternal Grandfather    History  Substance Use Topics  . Smoking status: Former Smoker -- 1.00 packs/day  . Smokeless tobacco: Never Used  . Alcohol Use: No     Comment: started AA 05/2012.  Last drink early January  OB History   Grav Para Term Preterm Abortions TAB SAB Ect Mult Living                 Review of Systems  All other systems reviewed and are negative.      Allergies  Quinapril  Home Medications   Current Outpatient Rx  Name  Route  Sig  Dispense  Refill  . ALPRAZolam (XANAX) 1 MG tablet   Oral   Take 1 tablet (1 mg total) by mouth 2 (two) times daily. May fill on/after Feb 11th 2015.   60 tablet   0   . amoxicillin-clavulanate (AUGMENTIN) 500-125 MG per tablet      Take one by mouth every 8 hours for ten total days.   30 tablet   0   . atenolol (TENORMIN) 50 MG tablet      TAKE 1 TABLET BY MOUTH EVERY DAY   30 tablet   5   . CINNAMON PO   Oral   Take by mouth.         . metFORMIN (GLUCOPHAGE) 500 MG tablet      Two tablets by mouth at breakfast and one by mouth at dinnertime.   90 tablet   5   . Multiple Vitamins-Minerals  (MULTIVITAMIN WITH MINERALS) tablet   Oral   Take 1 tablet by mouth daily. Food enzyme supplement OTC Natures Sunshine         . risperiDONE (RISPERDAL) 1 MG tablet      TAKE 1 TABLET BY MOUTH EVERY DAY   30 tablet   2   . venlafaxine XR (EFFEXOR-XR) 150 MG 24 hr capsule   Oral   Take 1 capsule (150 mg total) by mouth daily.   30 capsule   4   . venlafaxine XR (EFFEXOR-XR) 75 MG 24 hr capsule   Oral   Take 1 capsule (75 mg total) by mouth daily with breakfast.   30 capsule   4   . levofloxacin (LEVAQUIN) 500 MG tablet   Oral   Take 1 tablet (500 mg total) by mouth daily.   7 tablet   0   . risperiDONE (RISPERDAL) 1 MG tablet      TAKE 1 TABLET BY MOUTH DAILY   30 tablet   0    BP 113/76  Pulse 80  Temp(Src) 98.4 F (36.9 C) (Oral)  Ht 5\' 6"  (1.676 m)  Wt 193 lb (87.544 kg)  BMI 31.17 kg/m2  SpO2 96% Physical Exam  Nursing note and vitals reviewed. Constitutional: She is oriented to person, place, and time. She appears well-developed and well-nourished.  HENT:  Head: Normocephalic and atraumatic.  Right Ear: Tympanic membrane, external ear and ear canal normal.  Left Ear: Tympanic membrane, external ear and ear canal normal.  Nose: Mucosal edema and rhinorrhea present.  Mouth/Throat: Posterior oropharyngeal erythema present. No oropharyngeal exudate or posterior oropharyngeal edema.  Eyes: No scleral icterus.  Neck: Neck supple.  Cardiovascular: Regular rhythm and normal heart sounds.   Pulmonary/Chest: Effort normal and breath sounds normal. No respiratory distress. She has no decreased breath sounds. She has no wheezes. She has no rhonchi.  Neurological: She is alert and oriented to person, place, and time.  Skin: Skin is warm and dry.  Psychiatric: She has a normal mood and affect. Her speech is normal.    ED Course  Procedures (including critical care time) Labs Review Labs Reviewed - No data to display Imaging Review No results found.  MDM    Final diagnoses:  Acute upper respiratory infections of unspecified site  Acute bronchitis    1)  Take the prescribed antibiotic as instructed.  I don't feel that a CXR is appropriate at this point as her chest exam is normal.  However, I switched her from amoxicillin to Levaquin due to her worsening symptoms.  Possibly this could have influenza however appears to be more of a bronchitic issue and patient does have a history of multiple times with pneumonia.  In passing she states that she saw an ENT in the past who thought she may have a nodule on one of her vocal cords and advised her to followup with him since that has not been done. 2)  Use nasal saline solution (over the counter) at least 3 times a day. 3)  Use over the counter decongestants like Zyrtec-D every 12 hours as needed to help with congestion.  If you have hypertension, do not take medicines with sudafed.  4)  Can take tylenol every 6 hours or motrin every 8 hours for pain or fever. 5)  Follow up with your primary doctor if no improvement in 5-7 days, sooner if increasing pain, fever, or new symptoms.     Janeann Forehand, MD 06/28/13 [phone]

## 2013-06-28 NOTE — ED Notes (Signed)
Sara Leon complains of body aches, ear pain, headaches, sore throat, runny nose, sneezing, congestion, wheezing, cough and hoarseness for 3 days. She reports she is worse today. She was seen and treated by Dr Ileene Rubens with amoxicillin.

## 2013-07-10 ENCOUNTER — Ambulatory Visit (INDEPENDENT_AMBULATORY_CARE_PROVIDER_SITE_OTHER): Payer: BC Managed Care – PPO | Admitting: Surgery

## 2013-07-15 ENCOUNTER — Encounter (INDEPENDENT_AMBULATORY_CARE_PROVIDER_SITE_OTHER): Payer: Self-pay | Admitting: Surgery

## 2013-07-26 ENCOUNTER — Other Ambulatory Visit: Payer: Self-pay | Admitting: Family Medicine

## 2013-08-07 ENCOUNTER — Encounter: Payer: Self-pay | Admitting: Family Medicine

## 2013-08-07 DIAGNOSIS — E119 Type 2 diabetes mellitus without complications: Secondary | ICD-10-CM | POA: Insufficient documentation

## 2013-08-12 ENCOUNTER — Ambulatory Visit (INDEPENDENT_AMBULATORY_CARE_PROVIDER_SITE_OTHER): Payer: BC Managed Care – PPO | Admitting: Family Medicine

## 2013-08-12 ENCOUNTER — Encounter: Payer: Self-pay | Admitting: Family Medicine

## 2013-08-12 VITALS — BP 126/86 | HR 75 | Ht 65.5 in | Wt 190.0 lb

## 2013-08-12 DIAGNOSIS — E785 Hyperlipidemia, unspecified: Secondary | ICD-10-CM

## 2013-08-12 DIAGNOSIS — Z0000 Encounter for general adult medical examination without abnormal findings: Secondary | ICD-10-CM

## 2013-08-12 DIAGNOSIS — E119 Type 2 diabetes mellitus without complications: Secondary | ICD-10-CM

## 2013-08-12 DIAGNOSIS — I10 Essential (primary) hypertension: Secondary | ICD-10-CM

## 2013-08-12 MED ORDER — ATENOLOL 25 MG PO TABS: ORAL_TABLET | ORAL | Status: DC

## 2013-08-12 NOTE — Patient Instructions (Signed)
Dr. Verl Kitson's General Advice Following Your Complete Physical Exam  The Benefits of Regular Exercise: Unless you suffer from an uncontrolled cardiovascular condition, studies strongly suggest that regular exercise and physical activity will add to both the quality and length of your life.  The World Health Organization recommends 150 minutes of moderate intensity aerobic activity every week.  This is best split over 3-4 days a week, and can be as simple as a brisk walk for just over 35 minutes "most days of the week".  This type of exercise has been shown to lower LDL-Cholesterol, lower average blood sugars, lower blood pressure, lower cardiovascular disease risk, improve memory, and increase one's overall sense of wellbeing.  The addition of anaerobic (or "strength training") exercises offers additional benefits including but not limited to increased metabolism, prevention of osteoporosis, and improved overall cholesterol levels.  How Can I Strive For A Low-Fat Diet?: Current guidelines recommend that 25-35 percent of your daily energy (food) intake should come from fats.  One might ask how can this be achieved without having to dissect each meal on a daily basis?  Switch to skim or 1% milk instead of whole milk.  Focus on lean meats such as ground turkey, fresh fish, baked chicken, and lean cuts of beef as your source of dietary protein.  Limit saturated fat consumption to less than 10% of your daily caloric intake.  Limit trans fatty acid consumption primarily by limiting synthetic trans fats such as partially hydrogenated oils (Ex: fried fast foods).  Substitute olive or vegetable oil for solid fats where possible.  Moderation of Salt Intake: Provided you don't carry a diagnosis of congestive heart failure nor renal failure, I recommend a daily allowance of no more than 2300 mg of salt (sodium).  Keeping under this daily goal is associated with a decreased risk of cardiovascular events, creeping  above it can lead to elevated blood pressures and increases your risk of cardiovascular events.  Milligrams (mg) of salt is listed on all nutrition labels, and your daily intake can add up faster than you think.  Most canned and frozen dinners can pack in over half your daily salt allowance in one meal.    Lifestyle Health Risks: Certain lifestyle choices carry specific health risks.  As you may already know, tobacco use has been associated with increasing one's risk of cardiovascular disease, pulmonary disease, numerous cancers, among many other issues.  What you may not know is that there are medications and nicotine replacement strategies that can more than double your chances of successfully quitting.  I would be thrilled to help manage your quitting strategy if you currently use tobacco products.  When it comes to alcohol use, I've yet to find an "ideal" daily allowance.  Provided an individual does not have a medical condition that is exacerbated by alcohol consumption, general guidelines determine "safe drinking" as no more than two standard drinks for a man or no more than one standard drink for a female per day.  However, much debate still exists on whether any amount of alcohol consumption is technically "safe".  My general advice, keep alcohol consumption to a minimum for general health promotion.  If you or others believe that alcohol, tobacco, or recreational drug use is interfering with your life, I would be happy to provide confidential counseling regarding treatment options.  General "Over The Counter" Nutrition Advice: Postmenopausal women should aim for a daily calcium intake of 1200 mg, however a significant portion of this might already be   provided by diets including milk, yogurt, cheese, and other dairy products.  Vitamin D has been shown to help preserve bone density, prevent fatigue, and has even been shown to help reduce falls in the elderly.  Ensuring a daily intake of 800 Units of  Vitamin D is a good place to start to enjoy the above benefits, we can easily check your Vitamin D level to see if you'd potentially benefit from supplementation beyond 800 Units a day.  Folic Acid intake should be of particular concern to women of childbearing age.  Daily consumption of 400-800 mcg of Folic Acid is recommended to minimize the chance of spinal cord defects in a fetus should pregnancy occur.    For many adults, accidents still remain one of the most common culprits when it comes to cause of death.  Some of the simplest but most effective preventitive habits you can adopt include regular seatbelt use, proper helmet use, securing firearms, and regularly testing your smoke and carbon monoxide detectors.  Aubery Douthat B. Adeel Guiffre DO [facility] [address], [address] [place],  [address] Phone: [phone]  

## 2013-08-12 NOTE — Progress Notes (Signed)
CC: Sara Leon is a 48 y.o. female is here for Annual Exam   Subjective: HPI:  Colonoscopy: No current indication will begin age 56 Papsmear: She's had a Pap smear within the last year which was normal she gets this with GYN Mammogram: Bilateral diagnostic mammography in 6 months (from Feb 2015) with left breast  ultrasound to demonstrate a total of 1 year of stability of the  probably benign left breast mass.  Influenza Vaccine: Up-to-date Pneumovax: P-23 2014 Td/Tdap: Td 2007 Zoster: (Start 50 yo)  Rare alcohol use no tobacco or recreational drug use  Has stopped metformin due to blood sugars in the low 60s. Since stopping her blood sugars have never risen above 120. Review of Systems - General ROS: negative for - chills, fever, night sweats, weight gain or weight loss Ophthalmic ROS: negative for - decreased vision Psychological ROS: negative for - anxiety or depression ENT ROS: negative for - hearing change, nasal congestion, tinnitus or allergies Hematological and Lymphatic ROS: negative for - bleeding problems, bruising or swollen lymph nodes Breast ROS: negative Respiratory ROS: no cough, shortness of breath, or wheezing Cardiovascular ROS: no chest pain or dyspnea on exertion Gastrointestinal ROS: no abdominal pain, change in bowel habits, or black or bloody stools Genito-Urinary ROS: negative for - genital discharge, genital ulcers, incontinence or abnormal bleeding from genitals Musculoskeletal ROS: negative for - joint pain or muscle pain Neurological ROS: negative for - headaches or memory loss Dermatological ROS: negative for lumps, mole changes, rash and skin lesion changes  Past Medical History  Diagnosis Date  . Anxiety   . GERD (gastroesophageal reflux disease)   . Depression   . 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 Pancreatitis chronic     on resection specimen  . Deep venous thrombosis of upper extremity  2011    due to PICC  . Pancreatic insufficiency 10/16/2010  . Fatty liver     appears improved on imaging after weight loss  . Diabetes mellitus 10/2008    diet controlled  . Hyperlipidemia   . Hypertension   . Condyloma acuminatum of vulva   . Osteoarthritis of hand     bilat 2nd and 3rd fingers at MCP, PIP  . Wears contact lenses   . Routine gynecological examination     Dr. Elonda Leon, Sara Leon  . Bladder incontinence     Urology, pending studies 05/2012    Past Surgical History  Procedure Laterality Date  . Pancreaticoduodenectomy  05/2009     pancreatic intrepithelial neoplasia types 1A and 1B (Dr. Eugenia Leon)  . Tonsillectomy  1971  . Cholecystectomy  2000  . Dobbhoff feeding tube  07/04/09    Sara Leon  . Upper gastrointestinal endoscopy  2005 and 2010 - [place], [place]    gastritis 2005 and 2010, retained food 2005, no H. pyloi and duodenal bxs normal  . Upper endoscopic ultrasound w/ fna  05/12/2009    Uncinate process mass - Dr. Jerene Leon  . Pancreaticoduodenectomy  06/10/09    [facility], Dr. Eugenia Leon  . Abdominal hysterectomy  1987    Partial due to heavy bleeding, still has her ovaries   Family History  Problem Relation Age of Onset  . Crohn's disease      nephew  . Colon cancer Maternal Grandmother   . Cancer Maternal Grandmother     colon  . Cancer Mother 96    died of melanoma  . Hypertension Father   .  Heart disease Father     MI  . Hyperlipidemia Father   . Other Father     died in [place]  . Cancer Maternal Grandfather     colon  . Heart disease Paternal Grandfather     History   Social History  . Marital Status: Married    Spouse Name: N/A    Number of Children: 1  . Years of Education: N/A   Occupational History  . 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 Smoking status: Former Smoker -- 1.00 packs/day  . Smokeless tobacco: Never Used  . Alcohol Use: No     Comment: started AA 05/2012.  Last drink early January  .  Drug Use: No  . Sexual Activity: Yes    Partners: Male   Other Topics Concern  . 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  Exercises at the [employer]     Objective: BP 126/86  Pulse 75  Ht 5' 5.5" (1.664 m)  Wt 190 lb (86.183 kg)  BMI 31.13 kg/m2  General: No Acute Distress HEENT: Atraumatic, normocephalic, conjunctivae normal without scleral icterus.  No nasal discharge, hearing grossly intact, TMs with good landmarks bilaterally with no middle ear abnormalities, posterior pharynx clear without oral lesions. Neck: Supple, trachea midline, no cervical nor supraclavicular adenopathy. Pulmonary: Clear to auscultation bilaterally without wheezing, rhonchi, nor rales. Cardiac: Regular rate and rhythm.  No murmurs, rubs, nor gallops. No peripheral edema.  2+ peripheral pulses bilaterally. Abdomen: Bowel sounds normal.  No masses.  Non-tender without rebound.  Negative Murphy's sign. GU: Deferred to GYN visits  MSK: Grossly intact, no signs of weakness.  Full strength throughout upper and lower extremities.  Full ROM in upper and lower extremities.  No midline spinal tenderness. Neuro: Gait unremarkable, CN II-XII grossly intact.  C5-C6 Reflex 2/4 Bilaterally, L4 Reflex 2/4 Bilaterally.  Cerebellar function intact. Skin: No rashes. Psych: Alert and oriented to person/place/time.  Thought process normal. No anxiety/depression.  Assessment & Plan: Sara Leon was seen today for annual exam.  Diagnoses and associated orders for this visit:  Annual physical exam  Type 2 diabetes mellitus - BASIC METABOLIC PANEL WITH GFR - HgB A1c  Hyperlipidemia - Lipid panel  Essential hypertension  Other Orders - atenolol (TENORMIN) 25 MG tablet; Full tablet by mouth daily for one week, then half daily thereafter.    Healthy lifestyle interventions including but not limited to regular exercise, a healthy low fat diet,  moderation of salt intake, the dangers of tobacco/alcohol/recreational drug use, nutrition supplementation, and accident avoidance were discussed with the patient and a handout was provided for future reference. She's due for annual dyslipidemia followup and surveillance of her diabetes. We're going to taper down on atenolol over the next month since she is intentionally losing weight and blood pressure has followed.   Return in about 3 months (around 11/11/2013).

## 2013-08-26 ENCOUNTER — Other Ambulatory Visit: Payer: Self-pay | Admitting: *Deleted

## 2013-08-26 MED ORDER — ALPRAZOLAM 1 MG PO TABS: ORAL_TABLET | ORAL | Status: DC

## 2013-09-18 ENCOUNTER — Other Ambulatory Visit: Payer: Self-pay | Admitting: Family Medicine

## 2013-09-21 ENCOUNTER — Other Ambulatory Visit: Payer: Self-pay | Admitting: Family Medicine

## 2013-09-21 ENCOUNTER — Telehealth: Payer: Self-pay

## 2013-09-21 MED ORDER — ALPRAZOLAM 1 MG PO TABS: ORAL_TABLET | ORAL | Status: DC

## 2013-09-21 NOTE — Telephone Encounter (Signed)
Yes this is ok Seth Bake, Rx placed in in-box ready for pickup/faxing.

## 2013-09-21 NOTE — Telephone Encounter (Signed)
Sara Leon is calling for a early refill on her Xanax. The 14 th will be 30 days. She is leaving tomorrow to go to the beach. Is it ok to fill 3 days early?

## 2013-09-21 NOTE — Telephone Encounter (Signed)
rx faxed

## 2013-09-22 ENCOUNTER — Telehealth: Payer: Self-pay | Admitting: Family Medicine

## 2013-09-22 MED ORDER — RISPERIDONE 1 MG PO TABS: ORAL_TABLET | ORAL | Status: DC

## 2013-09-22 NOTE — Telephone Encounter (Signed)
Refill req 

## 2013-10-22 ENCOUNTER — Other Ambulatory Visit: Payer: Self-pay | Admitting: Family Medicine

## 2013-11-19 ENCOUNTER — Other Ambulatory Visit: Payer: Self-pay | Admitting: Family Medicine

## 2013-11-19 ENCOUNTER — Telehealth: Payer: Self-pay | Admitting: Family Medicine

## 2013-11-19 MED ORDER — VENLAFAXINE HCL ER 150 MG PO CP24: 150.0000 mg | ORAL_CAPSULE | Freq: Every day | ORAL | Status: DC

## 2013-11-19 MED ORDER — VENLAFAXINE HCL ER 75 MG PO CP24: 75.0000 mg | ORAL_CAPSULE | Freq: Every day | ORAL | Status: DC

## 2013-11-19 NOTE — Telephone Encounter (Signed)
Refills pending f/u

## 2013-11-20 ENCOUNTER — Ambulatory Visit: Payer: BC Managed Care – PPO | Admitting: Family Medicine

## 2013-11-20 DIAGNOSIS — Z0289 Encounter for other administrative examinations: Secondary | ICD-10-CM

## 2013-11-23 ENCOUNTER — Other Ambulatory Visit: Payer: Self-pay

## 2013-11-23 MED ORDER — ALPRAZOLAM 1 MG PO TABS: ORAL_TABLET | ORAL | Status: DC

## 2013-12-01 ENCOUNTER — Ambulatory Visit (INDEPENDENT_AMBULATORY_CARE_PROVIDER_SITE_OTHER): Payer: BC Managed Care – PPO | Admitting: Family Medicine

## 2013-12-01 ENCOUNTER — Encounter: Payer: Self-pay | Admitting: Family Medicine

## 2013-12-01 VITALS — BP 131/81 | HR 85 | Wt 163.0 lb

## 2013-12-01 DIAGNOSIS — F418 Other specified anxiety disorders: Secondary | ICD-10-CM

## 2013-12-01 DIAGNOSIS — E119 Type 2 diabetes mellitus without complications: Secondary | ICD-10-CM

## 2013-12-01 DIAGNOSIS — I10 Essential (primary) hypertension: Secondary | ICD-10-CM

## 2013-12-01 DIAGNOSIS — F341 Dysthymic disorder: Secondary | ICD-10-CM

## 2013-12-01 MED ORDER — ALPRAZOLAM 1 MG PO TABS: ORAL_TABLET | ORAL | Status: DC

## 2013-12-01 MED ORDER — VENLAFAXINE HCL ER 150 MG PO CP24: 150.0000 mg | ORAL_CAPSULE | Freq: Every day | ORAL | Status: DC

## 2013-12-01 MED ORDER — RISPERIDONE 1 MG PO TABS: ORAL_TABLET | ORAL | Status: DC

## 2013-12-01 MED ORDER — VENLAFAXINE HCL ER 75 MG PO CP24: 75.0000 mg | ORAL_CAPSULE | Freq: Every day | ORAL | Status: DC

## 2013-12-01 NOTE — Progress Notes (Signed)
CC: Sara Leon is a 48 y.o. female is here for f/u anxiety and depression   Subjective: HPI:  Followup anxiety and depression: Patient states that she is extremely happy with life right now currently using Xanax one to 2 times a day Risperdal daily and Effexor daily. She ran out of Effexor couple weeks ago and had a return of a poor outlook on life and subjective depression that resolved within one to 2 days after restarting Effexor. Denies thoughts of when to harm herself or others. She tells me that lately she is the happiest that she's been in a long time.  Followup essential hypertension: Since stopping atenolol her blood pressures at home have consistently been below 140/90. Denies chest pain, shortness of breath, motor sensory disturbances, no peripheral edema  Followup type 2 diabetes: No outside blood sugars to report still no longer taking metformin. She has had approximately 20 pound weight loss since I saw her last due to beginning a new exercise routine, spacing out her meals and focusing on portion control. No polyuria polyphasia or polydipsia   Review Of Systems Outlined In HPI  Past Medical History  Diagnosis Date  . Anxiety   . GERD (gastroesophageal reflux disease)   . Depression   . 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 Pancreatitis chronic     on resection specimen  . Deep venous thrombosis of upper extremity 2011    due to PICC  . Pancreatic insufficiency 10/16/2010  . Fatty liver     appears improved on imaging after weight loss  . Diabetes mellitus 10/2008    diet controlled  . Hyperlipidemia   . Hypertension   . Condyloma acuminatum of vulva   . Osteoarthritis of hand     bilat 2nd and 3rd fingers at MCP, PIP  . Wears contact lenses   . Routine gynecological examination     Dr. Elonda Leon, Sara Leon  . Bladder incontinence     Urology, pending studies 05/2012    Past Surgical History  Procedure Laterality Date   . Pancreaticoduodenectomy  05/2009     pancreatic intrepithelial neoplasia types 1A and 1B (Dr. Eugenia Leon)  . Tonsillectomy  1971  . Cholecystectomy  2000  . Dobbhoff feeding tube  07/04/09    [facility]  . Upper gastrointestinal endoscopy  2005 and 2010 - [place], [place]    gastritis 2005 and 2010, retained food 2005, no H. pyloi and duodenal bxs normal  . Upper endoscopic ultrasound w/ fna  05/12/2009    Uncinate process mass - Dr. Jerene Leon  . Pancreaticoduodenectomy  06/10/09    [facility], Dr. Eugenia Leon  . Abdominal hysterectomy  1987    Partial due to heavy bleeding, still has her ovaries   Family History  Problem Relation Age of Onset  . Crohn's disease      nephew  . Colon cancer Maternal Grandmother   . Cancer Maternal Grandmother     colon  . Cancer Mother 24    died of melanoma  . Hypertension Father   . Heart disease Father     MI  . Hyperlipidemia Father   . Other Father     died in [place]  . Cancer Maternal Grandfather     colon  . Heart disease Paternal Grandfather     History   Social History  . Marital Status: Married    Spouse Name: N/A    Number of Children: 1  .  Years of Education: N/A   Occupational History  . 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 Smoking status: Former Smoker -- 1.00 packs/day  . Smokeless tobacco: Never Used  . Alcohol Use: No     Comment: started AA 05/2012.  Last drink early January  . Drug Use: No  . Sexual Activity: Yes    Partners: Male   Other Topics Concern  . 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  Exercises at the [employer]     Objective: BP 131/81  Pulse 85  Wt 163 lb (73.936 kg)  General: Alert and Oriented, No Acute Distress HEENT: Pupils equal, round, reactive to light. Conjunctivae clear.  Moist he does membranes pharynx unremarkable Lungs: Clear to auscultation bilaterally, no  wheezing/ronchi/rales.  Comfortable work of breathing. Good air movement. Cardiac: Regular rate and rhythm. Normal S1/S2.  No murmurs, rubs, nor gallops.   Extremities: No peripheral edema.  Strong peripheral pulses.  Mental Status: No depression, anxiety, nor agitation. Skin: Warm and dry.  Assessment & Plan: Sara Leon was seen today for f/u anxiety and depression.  Diagnoses and associated orders for this visit:  Depression with anxiety - venlafaxine XR (EFFEXOR-XR) 75 MG 24 hr capsule; Take 1 capsule (75 mg total) by mouth daily with breakfast. - venlafaxine XR (EFFEXOR-XR) 150 MG 24 hr capsule; Take 1 capsule (150 mg total) by mouth daily. - risperiDONE (RISPERDAL) 1 MG tablet; TAKE 1 TABLET BY MOUTH EVERY DAY - ALPRAZolam (XANAX) 1 MG tablet; TAKE 1 TABLET BY MOUTH TWICE DAILY  Type 2 diabetes mellitus without complication - Hemoglobin A1c  Essential hypertension    Depression and anxiety: Controlled continue as needed Xanax, and daily Effexor with Risperdal Type 2 diabetes: Clinically controlled due for A1c, congratulated her weight loss Essential hypertension: Controlled on no antihypertensives. Continue diet and exercise modifications   Return in about 6 months (around 06/03/2014).

## 2013-12-02 LAB — HEMOGLOBIN A1C
Hgb A1c MFr Bld: 5.9 % — ABNORMAL HIGH (ref <5.7)
Mean Plasma Glucose: 123 mg/dL — ABNORMAL HIGH (ref <117)

## 2013-12-19 ENCOUNTER — Other Ambulatory Visit: Payer: Self-pay | Admitting: Family Medicine

## 2014-01-21 ENCOUNTER — Ambulatory Visit (INDEPENDENT_AMBULATORY_CARE_PROVIDER_SITE_OTHER): Payer: BC Managed Care – PPO | Admitting: Family Medicine

## 2014-01-21 ENCOUNTER — Encounter: Payer: Self-pay | Admitting: Family Medicine

## 2014-01-21 VITALS — BP 120/83 | HR 81 | Wt 172.0 lb

## 2014-01-21 DIAGNOSIS — S39011A Strain of muscle, fascia and tendon of abdomen, initial encounter: Secondary | ICD-10-CM

## 2014-01-21 DIAGNOSIS — IMO0002 Reserved for concepts with insufficient information to code with codable children: Secondary | ICD-10-CM | POA: Diagnosis not present

## 2014-01-21 DIAGNOSIS — Z9889 Other specified postprocedural states: Secondary | ICD-10-CM

## 2014-01-21 DIAGNOSIS — Z8719 Personal history of other diseases of the digestive system: Secondary | ICD-10-CM | POA: Insufficient documentation

## 2014-01-21 NOTE — Progress Notes (Signed)
CC: Sara Leon is a 48 y.o. female is here for Abdominal Pain   Subjective: HPI:  On July 30 the patient had a repair of a ventral hernia that was symptomatic with pain. She was written out of work for a little over 3 weeks and was not sent her back to work until the end of this week however she was able to talk her surgeon into allowing her to return on Friday of last week. Even though she has a sitting job she's had increasing abdominal pain ever since being off work. Symptoms are worse with bending forward over her desk or with lifting anything heavier than a gallon of milk. Pain worsens on a daily basis more if she's sitting at a desk if she is leaning forward over paperwork. Symptoms are 100% improved when she is reclined beyond 45 backwards. She describes the pain only as feeling like a pulled muscle which is influenced with any abdominal muscle contracture.  She denies any nausea, vomiting, fevers, chills, diarrhea, constipation, overlying skin changes at the site of the hernia repair or laparoscopic ports.  There's been no decrease in appetite. The pain is not radiating. She denies any bulge in the abdomen.   Review Of Systems Outlined In HPI  Past Medical History  Diagnosis Date  . Anxiety   . GERD (gastroesophageal reflux disease)   . Depression   . 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 Pancreatitis chronic     on resection specimen  . Deep venous thrombosis of upper extremity 2011    due to PICC  . Pancreatic insufficiency 10/16/2010  . Fatty liver     appears improved on imaging after weight loss  . Diabetes mellitus 10/2008    diet controlled  . Hyperlipidemia   . Hypertension   . Condyloma acuminatum of vulva   . Osteoarthritis of hand     bilat 2nd and 3rd fingers at MCP, PIP  . Wears contact lenses   . Routine gynecological examination     Dr. Elonda Husky, Linna Hoff  . Bladder incontinence     Urology, pending studies  05/2012    Past Surgical History  Procedure Laterality Date  . Pancreaticoduodenectomy  05/2009     pancreatic intrepithelial neoplasia types 1A and 1B (Dr. Eugenia Pancoast)  . Tonsillectomy  1971  . Cholecystectomy  2000  . Dobbhoff feeding tube  07/04/09    [facility]  . Upper gastrointestinal endoscopy  2005 and 2010 - [place], [place]    gastritis 2005 and 2010, retained food 2005, no H. pyloi and duodenal bxs normal  . Upper endoscopic ultrasound w/ fna  05/12/2009    Uncinate process mass - Dr. Jerene Pitch  . Pancreaticoduodenectomy  06/10/09    [facility], Dr. Eugenia Pancoast  . Abdominal hysterectomy  1987    Partial due to heavy bleeding, still has her ovaries   Family History  Problem Relation Age of Onset  . Crohn's disease      nephew  . Colon cancer Maternal Grandmother   . Cancer Maternal Grandmother     colon  . Cancer Mother 83    died of melanoma  . Hypertension Father   . Heart disease Father     MI  . Hyperlipidemia Father   . Other Father     died in [place]  . Cancer Maternal Grandfather     colon  . Heart disease Paternal Grandfather     History  Social History  . Marital Status: Married    Spouse Name: N/A    Number of Children: 1  . Years of Education: N/A   Occupational History  . 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 Smoking status: Former Smoker -- 1.00 packs/day  . Smokeless tobacco: Never Used  . Alcohol Use: No     Comment: started AA 05/2012.  Last drink early January  . Drug Use: No  . Sexual Activity: Yes    Partners: Male   Other Topics Concern  . 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  Exercises at the [employer]     Objective: BP 120/83  Pulse 81  Wt 172 lb (78.019 kg)  General: Alert and Oriented, No Acute Distress HEENT: Pupils equal, round, reactive to light. Conjunctivae clear.   moist mucous membranes pharynx unremarkable   Lungs: Clear to auscultation bilaterally, no wheezing/ronchi/rales.  Comfortable work of breathing. Good air movement. Cardiac: Regular rate and rhythm. Normal S1/S2.  No murmurs, rubs, nor gallops.   Abdomen:  normal bowel sounds, soft, no guarding or rigidity. No palpable masses. No palpable hernias  with Valsalva or cough. Pain is only reproduced with contraction of the abdominal musculature. Extremities: No peripheral edema.  Strong peripheral pulses.  Mental Status: No depression, anxiety, nor agitation. Skin: Warm and dry.  Assessment & Plan: Sara Leon was seen today for abdominal pain.  Diagnoses and associated orders for this visit:  Abdominal muscle strain, initial encounter  S/P repair of ventral hernia    Abdominal pain: Joint decision for her not to return to work until Monday and to work on gentle core strengthening exercises while standing.Signs and symptoms requring emergent/urgent reevaluation were discussed with the patient. Low suspicion of intra-abdominal pathology beyond the muscle strain.   Return if symptoms worsen or fail to improve.

## 2014-03-03 ENCOUNTER — Other Ambulatory Visit: Payer: Self-pay | Admitting: *Deleted

## 2014-03-03 DIAGNOSIS — F418 Other specified anxiety disorders: Secondary | ICD-10-CM

## 2014-03-03 MED ORDER — ALPRAZOLAM 1 MG PO TABS: ORAL_TABLET | ORAL | Status: DC

## 2014-03-04 ENCOUNTER — Other Ambulatory Visit: Payer: Self-pay | Admitting: Family Medicine

## 2014-03-22 ENCOUNTER — Other Ambulatory Visit: Payer: Self-pay | Admitting: Family Medicine

## 2014-03-22 DIAGNOSIS — N6320 Unspecified lump in the left breast, unspecified quadrant: Secondary | ICD-10-CM

## 2014-03-22 DIAGNOSIS — N644 Mastodynia: Secondary | ICD-10-CM

## 2014-03-30 ENCOUNTER — Ambulatory Visit: Payer: BC Managed Care – PPO | Admitting: Family Medicine

## 2014-03-31 ENCOUNTER — Ambulatory Visit (INDEPENDENT_AMBULATORY_CARE_PROVIDER_SITE_OTHER): Payer: BC Managed Care – PPO | Admitting: Family Medicine

## 2014-03-31 ENCOUNTER — Encounter: Payer: Self-pay | Admitting: Family Medicine

## 2014-03-31 ENCOUNTER — Ambulatory Visit (INDEPENDENT_AMBULATORY_CARE_PROVIDER_SITE_OTHER): Payer: BC Managed Care – PPO | Admitting: *Deleted

## 2014-03-31 VITALS — BP 155/88 | HR 72 | Wt 183.0 lb

## 2014-03-31 DIAGNOSIS — Z23 Encounter for immunization: Secondary | ICD-10-CM

## 2014-03-31 DIAGNOSIS — R911 Solitary pulmonary nodule: Secondary | ICD-10-CM | POA: Insufficient documentation

## 2014-03-31 DIAGNOSIS — R109 Unspecified abdominal pain: Secondary | ICD-10-CM | POA: Diagnosis not present

## 2014-03-31 MED ORDER — DICLOFENAC SODIUM 75 MG PO TBEC: 75.0000 mg | DELAYED_RELEASE_TABLET | Freq: Two times a day (BID) | ORAL | Status: DC

## 2014-03-31 NOTE — Progress Notes (Signed)
CC: Sara Leon is a 48 y.o. female is here for f/u Er visit spot on the lungs   Subjective: HPI:  Patient presents for hospital follow-up for visit that occurred on Tuesday of this week.  Patient states on Monday night she began to have left upper quadrant pain that began to radiate down into the left lower quadrant, pain became severe enough that on Tuesday morning she went to a local emergency room. She had a unremarkable EKG, unremarkable blood work other than mildly elevated blood sugar. She had a CT scan of the chest showing a 4 mm pulmonary nodule. She also had an abdominal and pelvis CT scan with contrast that was unremarkable but she has her radiology report today and is concerned about "pneumobilia" and a "functional cyst" in the right adnexal region.  She reports that her pain is improved but still present and she believes she can feel a small knot in the fat of the left upper quadrant.    She has a at least 20-pack-year history of smoking and had her last cigarette yesterday. She also has a history of pancreatic cancer. She has no pulmonary complaints today nor has she had them within the last few weeks. There's been no unintentional weight loss, fevers, chills, cough, wheezing, nor back pain.   Review Of Systems Outlined In HPI  Past Medical History  Diagnosis Date  . Anxiety   . GERD (gastroesophageal reflux disease)   . Depression   . 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 Pancreatitis chronic     on resection specimen  . Deep venous thrombosis of upper extremity 2011    due to PICC  . Pancreatic insufficiency 10/16/2010  . Fatty liver     appears improved on imaging after weight loss  . Diabetes mellitus 10/2008    diet controlled  . Hyperlipidemia   . Hypertension   . Condyloma acuminatum of vulva   . Osteoarthritis of hand     bilat 2nd and 3rd fingers at MCP, PIP  . Wears contact lenses   . Routine gynecological  examination     Dr. Elonda Husky, Linna Hoff  . Bladder incontinence     Urology, pending studies 05/2012    Past Surgical History  Procedure Laterality Date  . Pancreaticoduodenectomy  05/2009     pancreatic intrepithelial neoplasia types 1A and 1B (Dr. Eugenia Pancoast)  . Tonsillectomy  1971  . Cholecystectomy  2000  . Dobbhoff feeding tube  07/04/09    [facility]  . Upper gastrointestinal endoscopy  2005 and 2010 - [place], [place]    gastritis 2005 and 2010, retained food 2005, no H. pyloi and duodenal bxs normal  . Upper endoscopic ultrasound w/ fna  05/12/2009    Uncinate process mass - Dr. Jerene Pitch  . Pancreaticoduodenectomy  06/10/09    [facility], Dr. Eugenia Pancoast  . Abdominal hysterectomy  1987    Partial due to heavy bleeding, still has her ovaries   Family History  Problem Relation Age of Onset  . Crohn's disease      nephew  . Colon cancer Maternal Grandmother   . Cancer Maternal Grandmother     colon  . Cancer Mother 70    died of melanoma  . Hypertension Father   . Heart disease Father     MI  . Hyperlipidemia Father   . Other Father     died in [place]  . Cancer Maternal Grandfather  colon  . Heart disease Paternal Grandfather     History   Social History  . Marital Status: Married    Spouse Name: N/A    Number of Children: 1  . Years of Education: N/A   Occupational History  . 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 Smoking status: Former Smoker -- 1.00 packs/day  . Smokeless tobacco: Never Used  . Alcohol Use: No     Comment: started AA 05/2012.  Last drink early January  . Drug Use: No  . Sexual Activity:    Partners: Male   Other Topics Concern  . 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  Exercises at the [employer]     Objective: BP 155/88 mmHg  Pulse 72  Wt 183 lb (83.008 kg)  General: Alert and Oriented, No Acute Distress HEENT: Pupils equal,  round, reactive to light. Conjunctivae clear.  Moist mucous membranespharynx unremarkable Lungs: Clear to auscultation bilaterally, no wheezing/ronchi/rales.  Comfortable work of breathing. Good air movement. Cardiac: Regular rate and rhythm. Normal S1/S2.  No murmurs, rubs, nor gallops.   Abdomen: Normal bowel sounds, soft and non tender without palpable masses other than a approximately 1/2 cm diameter soft subcutaneous nodule in the left upper quadrant underlying one of the laparoscopic ports from a hernia repair Extremities: No peripheral edema.  Strong peripheral pulses.  Mental Status: No depression, anxiety, nor agitation. Skin: Warm and dry.  Assessment & Plan: Drisana was seen today for f/u er visit spot on the lungs.  Diagnoses and associated orders for this visit:  Solitary pulmonary nodule  Abdominal wall pain - diclofenac (VOLTAREN) 75 MG EC tablet; Take 1 tablet (75 mg total) by mouth 2 (two) times daily.    Time was taken to answer all of her questions regarding a number of references in her CT reports. Solitary pulmonary nodule: She is considered a high risk patient, the radiologist recommendations and all following up in 12 months. The patient would like to have sooner follow-up if possible and we discussed having a repeat CT scan in 3-6 months. applauded her quitting smoking Abdominal wall pain: Reassurance was provided that I believe her discomfort is due to scar tissue from a recent surgery, try diclofenac for pain which I do not feel his reflective of anything more serious than inflammation and nerve irritation   Return in about 3 months (around 07/01/2014) for Discussion of pulmonary nodule.Marland Kitchen

## 2014-04-07 ENCOUNTER — Other Ambulatory Visit: Payer: BC Managed Care – PPO

## 2014-04-22 ENCOUNTER — Other Ambulatory Visit: Payer: BC Managed Care – PPO

## 2014-06-07 ENCOUNTER — Telehealth: Payer: Self-pay | Admitting: Family Medicine

## 2014-06-07 DIAGNOSIS — F418 Other specified anxiety disorders: Secondary | ICD-10-CM

## 2014-06-07 MED ORDER — ALPRAZOLAM 1 MG PO TABS: ORAL_TABLET | ORAL | Status: DC

## 2014-06-07 NOTE — Telephone Encounter (Signed)
Refill req 

## 2014-06-18 ENCOUNTER — Ambulatory Visit
Admission: RE | Admit: 2014-06-18 | Discharge: 2014-06-18 | Disposition: A | Discharge status: Home / Self Care | Payer: BLUE CROSS/BLUE SHIELD | Source: Ambulatory Visit | Attending: Family Medicine | Admitting: Family Medicine

## 2014-06-18 DIAGNOSIS — N6320 Unspecified lump in the left breast, unspecified quadrant: Secondary | ICD-10-CM

## 2014-06-18 DIAGNOSIS — N644 Mastodynia: Secondary | ICD-10-CM

## 2014-06-21 ENCOUNTER — Other Ambulatory Visit: Payer: Self-pay | Admitting: Family Medicine

## 2014-06-21 NOTE — Telephone Encounter (Signed)
Due for f/u.appt needed before future refills

## 2014-07-02 ENCOUNTER — Ambulatory Visit (INDEPENDENT_AMBULATORY_CARE_PROVIDER_SITE_OTHER): Payer: BLUE CROSS/BLUE SHIELD | Admitting: Family Medicine

## 2014-07-02 ENCOUNTER — Telehealth: Payer: Self-pay

## 2014-07-02 ENCOUNTER — Ambulatory Visit (INDEPENDENT_AMBULATORY_CARE_PROVIDER_SITE_OTHER): Payer: BLUE CROSS/BLUE SHIELD

## 2014-07-02 ENCOUNTER — Encounter: Payer: Self-pay | Admitting: Family Medicine

## 2014-07-02 ENCOUNTER — Ambulatory Visit (HOSPITAL_COMMUNITY): Payer: BLUE CROSS/BLUE SHIELD

## 2014-07-02 ENCOUNTER — Other Ambulatory Visit: Payer: Self-pay | Admitting: Family Medicine

## 2014-07-02 VITALS — BP 144/92 | HR 65 | Wt 197.0 lb

## 2014-07-02 DIAGNOSIS — E119 Type 2 diabetes mellitus without complications: Secondary | ICD-10-CM | POA: Diagnosis not present

## 2014-07-02 DIAGNOSIS — R062 Wheezing: Secondary | ICD-10-CM | POA: Diagnosis not present

## 2014-07-02 DIAGNOSIS — R911 Solitary pulmonary nodule: Secondary | ICD-10-CM

## 2014-07-02 DIAGNOSIS — I10 Essential (primary) hypertension: Secondary | ICD-10-CM

## 2014-07-02 MED ORDER — ATENOLOL 50 MG PO TABS: 75.0000 mg | ORAL_TABLET | Freq: Every day | ORAL | Status: DC

## 2014-07-02 NOTE — Progress Notes (Signed)
CC: Sara Leon is a 49 y.o. female is here for Hypertension and recheck lungs   Subjective: HPI:  Follow-up essential hypertension: She had a UTI that was discovered at a local urgent care clinic and was also found to have a blood pressure 190/100. She was then prescribed 50 mg of atenolol but she's been taking once a day without any side effects. No other outside blood pressures reported. Denies chest pain shortness of breath orthopnea nor peripheral edema  Follow-up pulmonary nodule: November of last year she was found to have a 4 mm pulmonary nodule incidentally seen on a CT scan of the chest. She has a history of smoking and like to be aggressive about following up on this finding. It's been 6 months since she had this initially found. She has some wheezing present every morning but denies any chronic cough or chest discomfort.  Follow-up type 2 diabetes: She still continues to no longer take metformin based on our joint decision of many months ago. Denies polyuria polyphagia polydipsia nor poorly healing wounds.  For her wheezing she was referred to pulmonology by her oncologist however she never got notified about the time or place of an office visit.    Review Of Systems Outlined In HPI  Past Medical History  Diagnosis Date  . Anxiety   . GERD (gastroesophageal reflux disease)   . Depression   . 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 Pancreatitis chronic     on resection specimen  . Deep venous thrombosis of upper extremity 2011    due to PICC  . Pancreatic insufficiency 10/16/2010  . Fatty liver     appears improved on imaging after weight loss  . Diabetes mellitus 10/2008    diet controlled  . Hyperlipidemia   . Hypertension   . Condyloma acuminatum of vulva   . Osteoarthritis of hand     bilat 2nd and 3rd fingers at MCP, PIP  . Wears contact lenses   . Routine gynecological examination     Dr. Elonda Husky, Linna Hoff  .  Bladder incontinence     Urology, pending studies 05/2012    Past Surgical History  Procedure Laterality Date  . Pancreaticoduodenectomy  05/2009     pancreatic intrepithelial neoplasia types 1A and 1B (Dr. Eugenia Pancoast)  . Tonsillectomy  1971  . Cholecystectomy  2000  . Dobbhoff feeding tube  07/04/09    [facility]  . Upper gastrointestinal endoscopy  2005 and 2010 - [place], [place]    gastritis 2005 and 2010, retained food 2005, no H. pyloi and duodenal bxs normal  . Upper endoscopic ultrasound w/ fna  05/12/2009    Uncinate process mass - Dr. Jerene Pitch  . Pancreaticoduodenectomy  06/10/09    [facility], Dr. Eugenia Pancoast  . Abdominal hysterectomy  1987    Partial due to heavy bleeding, still has her ovaries   Family History  Problem Relation Age of Onset  . Crohn's disease      nephew  . Colon cancer Maternal Grandmother   . Cancer Maternal Grandmother     colon  . Cancer Mother 25    died of melanoma  . Hypertension Father   . Heart disease Father     MI  . Hyperlipidemia Father   . Other Father     died in [place]  . Cancer Maternal Grandfather     colon  . Heart disease Paternal Grandfather     History  Social History  . Marital Status: Married    Spouse Name: N/A  . Number of Children: 1  . Years of Education: N/A   Occupational History  . 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 Smoking status: Former Smoker -- 1.00 packs/day  . Smokeless tobacco: Never Used  . Alcohol Use: No     Comment: started AA 05/2012.  Last drink early January  . Drug Use: No  . Sexual Activity:    Partners: Male   Other Topics Concern  . 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  Exercises at the [employer]     Objective: BP 144/92 mmHg  Pulse 65  Wt 197 lb (89.359 kg)  General: Alert and Oriented, No Acute Distress HEENT: Pupils equal, round, reactive to light. Conjunctivae  clear.  Moist his mucous membranes pharynx unremarkable Lungs: Clear to auscultation bilaterally, no wheezing/ronchi/rales.  Comfortable work of breathing. Good air movement. Cardiac: Regular rate and rhythm. Normal S1/S2.  No murmurs, rubs, nor gallops.   Extremities: No peripheral edema.  Strong peripheral pulses.  Mental Status: No depression, anxiety, nor agitation. Skin: Warm and dry.  Assessment & Plan: Sara Leon was seen today for hypertension and recheck lungs.  Diagnoses and all orders for this visit:  Essential hypertension Orders: -     atenolol (TENORMIN) 50 MG tablet; Take 1.5 tablets (75 mg total) by mouth daily.  Solitary pulmonary nodule Orders: -     CT Chest Wo Contrast; Future  Type 2 diabetes mellitus without complication Orders: -     Hemoglobin A1c  Wheezing Orders: -     Ambulatory referral to Pulmonology  Other orders -     Cancel: atenolol (TENORMIN) 25 MG tablet; Take 1 tablet (25 mg total) by mouth daily.   Essential hypertension: Uncontrolled chronic condition increasing atenolol now 75 mg daily Pulmonary nodule: Obtaining repeat CT scan of the chest today, without contrast Type 2 diabetes: Clinically controlled but due for A1c Wheezing: Per her request referral to pulmonology has been placed  Return if symptoms worsen or fail to improve.

## 2014-07-02 NOTE — Telephone Encounter (Signed)
PA required for CT chest without contrast - Approved [id number] - expires 07/31/2014

## 2014-07-03 LAB — HEMOGLOBIN A1C
Hgb A1c MFr Bld: 6.2 % — ABNORMAL HIGH (ref <5.7)
Mean Plasma Glucose: 131 mg/dL — ABNORMAL HIGH (ref <117)

## 2014-07-05 ENCOUNTER — Telehealth: Payer: Self-pay | Admitting: Family Medicine

## 2014-07-05 DIAGNOSIS — R911 Solitary pulmonary nodule: Secondary | ICD-10-CM

## 2014-07-05 NOTE — Telephone Encounter (Addendum)
Seth Bake, Will you please let patient know that her nodule size has not changed.  This is reassuring, the radiologist has recommended that a CT scan be repeated in one year to ensure it's not enlarging.  Her A1c was 6.2 which is still in the prediabetic range but does not require any medication since it is at goal below 7

## 2014-07-05 NOTE — Telephone Encounter (Signed)
Left message on vm

## 2014-07-12 ENCOUNTER — Institutional Professional Consult (permissible substitution): Payer: BLUE CROSS/BLUE SHIELD | Admitting: Pulmonary Disease

## 2014-07-21 ENCOUNTER — Other Ambulatory Visit: Payer: Self-pay | Admitting: Family Medicine

## 2014-07-24 ENCOUNTER — Other Ambulatory Visit: Payer: Self-pay | Admitting: Family Medicine

## 2014-08-19 ENCOUNTER — Other Ambulatory Visit: Payer: Self-pay | Admitting: Family Medicine

## 2014-08-20 ENCOUNTER — Other Ambulatory Visit: Payer: Self-pay | Admitting: Family Medicine

## 2014-09-01 ENCOUNTER — Other Ambulatory Visit: Payer: Self-pay | Admitting: Family Medicine

## 2014-09-01 DIAGNOSIS — F418 Other specified anxiety disorders: Secondary | ICD-10-CM

## 2014-09-01 MED ORDER — ALPRAZOLAM 1 MG PO TABS: ORAL_TABLET | ORAL | Status: DC

## 2014-09-01 MED ORDER — VENLAFAXINE HCL ER 75 MG PO CP24: ORAL_CAPSULE | ORAL | Status: DC

## 2014-09-01 NOTE — Telephone Encounter (Signed)
Sara Leon, Rx placed in in-box ready for pickup/faxing. Effexor sent to [facility]

## 2014-09-01 NOTE — Telephone Encounter (Signed)
Patient request refill for Xanax. It is ok to refill last OV for anxiety was 12/01/2013. Rhonda Cunningham,CMA \

## 2014-09-16 ENCOUNTER — Other Ambulatory Visit: Payer: Self-pay | Admitting: Family Medicine

## 2014-09-17 ENCOUNTER — Other Ambulatory Visit: Payer: Self-pay | Admitting: Family Medicine

## 2014-10-06 ENCOUNTER — Other Ambulatory Visit: Payer: Self-pay | Admitting: Family Medicine

## 2014-10-06 DIAGNOSIS — F418 Other specified anxiety disorders: Secondary | ICD-10-CM

## 2014-10-06 MED ORDER — ALPRAZOLAM 1 MG PO TABS: ORAL_TABLET | ORAL | Status: DC

## 2014-10-06 NOTE — Telephone Encounter (Signed)
patient called in tears stating she needs a refill on her xanax and the first available appointment is not until 10/12/14. Patient states she had multiple anxiety attacks last night and needs enough Rx to last until her scheduled appointment. Advised I would sent over 1 week supply to last until her appt, Pt was very grateful. Asked to send [facility] to the [facility] close to her work, this request seems appropriate.

## 2014-10-12 ENCOUNTER — Encounter: Payer: Self-pay | Admitting: Family Medicine

## 2014-10-12 ENCOUNTER — Ambulatory Visit (INDEPENDENT_AMBULATORY_CARE_PROVIDER_SITE_OTHER): Payer: BLUE CROSS/BLUE SHIELD | Admitting: Family Medicine

## 2014-10-12 VITALS — BP 131/86 | HR 67 | Wt 203.0 lb

## 2014-10-12 DIAGNOSIS — F418 Other specified anxiety disorders: Secondary | ICD-10-CM | POA: Diagnosis not present

## 2014-10-12 DIAGNOSIS — I10 Essential (primary) hypertension: Secondary | ICD-10-CM

## 2014-10-12 MED ORDER — VENLAFAXINE HCL ER 75 MG PO CP24: ORAL_CAPSULE | ORAL | Status: DC

## 2014-10-12 MED ORDER — ATENOLOL 50 MG PO TABS: 75.0000 mg | ORAL_TABLET | Freq: Every day | ORAL | Status: DC

## 2014-10-12 MED ORDER — ALPRAZOLAM 1 MG PO TABS: ORAL_TABLET | ORAL | Status: DC

## 2014-10-12 MED ORDER — RISPERIDONE 1 MG PO TABS: ORAL_TABLET | ORAL | Status: DC

## 2014-10-12 MED ORDER — VENLAFAXINE HCL ER 150 MG PO CP24: 150.0000 mg | ORAL_CAPSULE | Freq: Every day | ORAL | Status: DC

## 2014-10-12 NOTE — Progress Notes (Signed)
CC: Sara Leon is a 49 y.o. female is here for f/u anxiety   Subjective: HPI:   Follow-up essential hypertension: Since increasing atenolol she's noticed that she feels just overall much better in a physical sense. Denies chest pain shortness of breath orthopnea or peripheral edema nor motor or sensory disturbances other than that described below   Follow-up depression and anxiety: She states that anxiousness is present on a daily basis first thing in the morning when going into work and then  Nightly trying to fall asleep at night.  If she takes a  Single tablet of Xanax before going to work and right before going to bed symptoms above are minimal to absent. She is taking Effexor on a daily basis without thoughts of wanting to harm herself or others. She states that  She's happy with life and denies any element of depression.   Review Of Systems Outlined In HPI  Past Medical History  Diagnosis Date  . Anxiety   . GERD (gastroesophageal reflux disease)   . Depression   . 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 Pancreatitis chronic     on resection specimen  . Deep venous thrombosis of upper extremity 2011    due to PICC  . Pancreatic insufficiency 10/16/2010  . Fatty liver     appears improved on imaging after weight loss  . Diabetes mellitus 10/2008    diet controlled  . Hyperlipidemia   . Hypertension   . Condyloma acuminatum of vulva   . Osteoarthritis of hand     bilat 2nd and 3rd fingers at [facility], PIP  . Wears contact lenses   . Routine gynecological examination     Dr. Elonda Leon, Sara Leon  . Bladder incontinence     Urology, pending studies 05/2012    Past Surgical History  Procedure Laterality Date  . Pancreaticoduodenectomy  05/2009     pancreatic intrepithelial neoplasia types 1A and 1B (Dr. Eugenia Leon)  . Tonsillectomy  1971  . Cholecystectomy  2000  . Dobbhoff feeding tube  07/04/09    Sara Leon  . Upper gastrointestinal  endoscopy  2005 and 2010 - [place], [place]    gastritis 2005 and 2010, retained food 2005, no H. pyloi and duodenal bxs normal  . Upper endoscopic ultrasound w/ fna  05/12/2009    Uncinate process mass - Dr. Jerene Leon  . Pancreaticoduodenectomy  06/10/09    Sara Leon, Dr. Eugenia Leon  . Abdominal hysterectomy  1987    Partial due to heavy bleeding, still has her ovaries   Family History  Problem Relation Age of Onset  . Crohn's disease      nephew  . Colon cancer Maternal Grandmother   . Cancer Maternal Grandmother     colon  . Cancer Mother 32    died of melanoma  . Hypertension Father   . Heart disease Father     MI  . Hyperlipidemia Father   . Other Father     died in [place]  . Cancer Maternal Grandfather     colon  . Heart disease Paternal Grandfather     History   Social History  . Marital Status: Married    Spouse Name: Sara Leon  . Number of Children: 1  . Years of Education: Sara Leon   Occupational History  . 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 Smoking status: Former Smoker -- 1.00 packs/day  . Smokeless tobacco: Never Used  .  Alcohol Use: No     Comment: started AA 05/2012.  Last drink early January  . Drug Use: No  . Sexual Activity:    Partners: Male   Other Topics Concern  . 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  Exercises at the [employer]     Objective: BP 131/86 mmHg  Pulse 67  Wt 203 lb (92.08 kg)  General: Alert and Oriented, No Acute Distress HEENT: Pupils equal, round, reactive to light. Conjunctivae clear.   Moist mucous membranes Lungs: Clear to auscultation bilaterally, no wheezing/ronchi/rales.  Comfortable work of breathing. Good air movement. Cardiac: Regular rate and rhythm. Normal S1/S2.  No murmurs, rubs, nor gallops.   Extremities: No peripheral edema.  Strong peripheral pulses.  Mental Status: No depression, anxiety, nor  agitation. Skin: Warm and dry.  Assessment & Plan: Sara Leon was seen today for f/u anxiety.  Diagnoses and all orders for this visit:  Essential hypertension Orders: -     atenolol (TENORMIN) 50 MG tablet; Take 1.5 tablets (75 mg total) by mouth daily.  Depression with anxiety Orders: -     risperiDONE (RISPERDAL) 1 MG tablet; TAKE 1 TABLET BY MOUTH EVERY DAY. -     venlafaxine XR (EFFEXOR-XR) 150 MG 24 hr capsule; Take 1 capsule (150 mg total) by mouth daily with breakfast. -     venlafaxine XR (EFFEXOR-XR) 75 MG 24 hr capsule; TAKE ONE CAPSULE BY MOUTH DAILY WITH BREAKFAST. -     ALPRAZolam (XANAX) 1 MG tablet; TAKE 1 TABLET BY MOUTH TWICE DAILY as needed for anxiety.    essential hypertension: Controlled continue atenolol  Anxiety depression: Both are controlled with Effexor and as needed Xanax   I've let her know that her next refill of Xanax can be for another 3 months if she wants to space out follow-up for 6 months.  Return in about 6 months (around 04/13/2015).

## 2014-10-17 ENCOUNTER — Other Ambulatory Visit: Payer: Self-pay | Admitting: Family Medicine

## 2014-12-15 ENCOUNTER — Ambulatory Visit (INDEPENDENT_AMBULATORY_CARE_PROVIDER_SITE_OTHER): Payer: BLUE CROSS/BLUE SHIELD | Admitting: Family Medicine

## 2014-12-15 ENCOUNTER — Encounter: Payer: Self-pay | Admitting: Family Medicine

## 2014-12-15 VITALS — BP 129/86 | HR 70 | Temp 98.2°F | Wt 194.0 lb

## 2014-12-15 DIAGNOSIS — R591 Generalized enlarged lymph nodes: Secondary | ICD-10-CM

## 2014-12-15 MED ORDER — AMOXICILLIN-POT CLAVULANATE 500-125 MG PO TABS: ORAL_TABLET | ORAL | Status: AC

## 2014-12-15 NOTE — Progress Notes (Signed)
CC: Sara Leon is a 49 y.o. female is here for swollen area on the left side of face   Subjective: HPI:  Left-sided facial pain and swelling for the past week. It seems to fluctuate throughout the day. It's absent if she is not pressing on the spot but has a throbbing component to the pain when pressing on it. Last week she felt fatigued and like she was coming down with allergic sinusitis but this has resolved as a few days ago. Interventions have included taking ibuprofen without much benefit. There's been no overlying skin changes at the site of discomfort. She denies any change in the character of the pain with chewing or when salivating. She denies fevers, chills, unintentional weight loss, difficulty fighting infections or any scalp pain. Denies any scalp abnormalities or recent hair treatments. Denies nasal congestion or sore throat. Denies swelling elsewhere on the body.   Review Of Systems Outlined In HPI  Past Medical History  Diagnosis Date  . Anxiety   . GERD (gastroesophageal reflux disease)   . Depression   . 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 Pancreatitis chronic     on resection specimen  . Deep venous thrombosis of upper extremity 2011    due to PICC  . Pancreatic insufficiency 10/16/2010  . Fatty liver     appears improved on imaging after weight loss  . Diabetes mellitus 10/2008    diet controlled  . Hyperlipidemia   . Hypertension   . Condyloma acuminatum of vulva   . Osteoarthritis of hand     bilat 2nd and 3rd fingers at [facility], PIP  . Wears contact lenses   . Routine gynecological examination     Dr. Elonda Husky, Linna Hoff  . Bladder incontinence     Urology, pending studies 05/2012    Past Surgical History  Procedure Laterality Date  . Pancreaticoduodenectomy  05/2009     pancreatic intrepithelial neoplasia types 1A and 1B (Dr. Eugenia Pancoast)  . Tonsillectomy  1971  . Cholecystectomy  2000  . Dobbhoff feeding tube   07/04/09    [facility]  . Upper gastrointestinal endoscopy  2005 and 2010 - [place], [place]    gastritis 2005 and 2010, retained food 2005, no H. pyloi and duodenal bxs normal  . Upper endoscopic ultrasound w/ fna  05/12/2009    Uncinate process mass - Dr. Jerene Pitch  . Pancreaticoduodenectomy  06/10/09    [facility], Dr. Eugenia Pancoast  . Abdominal hysterectomy  1987    Partial due to heavy bleeding, still has her ovaries   Family History  Problem Relation Age of Onset  . Crohn's disease      nephew  . Colon cancer Maternal Grandmother   . Cancer Maternal Grandmother     colon  . Cancer Mother 51    died of melanoma  . Hypertension Father   . Heart disease Father     MI  . Hyperlipidemia Father   . Other Father     died in [place]  . Cancer Maternal Grandfather     colon  . Heart disease Paternal Grandfather     History   Social History  . Marital Status: Married    Spouse Name: N/A  . Number of Children: 1  . Years of Education: N/A   Occupational History  . 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 Smoking status: Former Smoker -- 1.00 packs/day  . Smokeless  tobacco: Never Used  . Alcohol Use: No     Comment: started AA 05/2012.  Last drink early January  . Drug Use: No  . Sexual Activity:    Partners: Male   Other Topics Concern  . 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  Exercises at the [employer]     Objective: BP 129/86 mmHg  Pulse 70  Temp(Src) 98.2 F (36.8 C) (Oral)  Wt 194 lb (87.998 kg)  General: Alert and Oriented, No Acute Distress HEENT: Pupils equal, round, reactive to light. Conjunctivae clear.  External ears unremarkable, canals clear with intact TMs with appropriate landmarks.  Middle ear appears open without effusion. Pink inferior turbinates.  Moist mucous membranes, pharynx without inflammation nor lesions.  Left Neck with a single half  centimeter diameter tender lymph node just anterior and inferior to the ear. Palpation of the parotid gland does not produce any discomfort. Scalp is unremarkable. No supraclavicular lymph nodes that are palpable. Lungs: Clear to auscultation bilaterally, no wheezing/ronchi/rales.  Comfortable work of breathing. Good air movement. Cardiac: Regular rate and rhythm. Normal S1/S2.  No murmurs, rubs, nor gallops.   Mental Status: No depression, anxiety, nor agitation. Skin: Warm and dry.  Assessment & Plan: Bela was seen today for swollen area on the left side of face.  Diagnoses and all orders for this visit:  Lymphadenopathy Orders: -     amoxicillin-clavulanate (AUGMENTIN) 500-125 MG per tablet; Take one by mouth every 8 hours for ten total days.   Reassurance is provided that this is most likely due to her body tried to fight off a bacterial infection, we can help speed this up with Augmentin, I've asked her to call me on Monday if she is absolutely no better, the next step would be to get an ultrasound along with CBC and differential.  Return if symptoms worsen or fail to improve.

## 2014-12-16 ENCOUNTER — Other Ambulatory Visit: Payer: Self-pay | Admitting: Family Medicine

## 2014-12-21 ENCOUNTER — Telehealth: Payer: Self-pay | Admitting: *Deleted

## 2014-12-21 DIAGNOSIS — R221 Localized swelling, mass and lump, neck: Secondary | ICD-10-CM

## 2014-12-21 NOTE — Telephone Encounter (Signed)
Pt called and states the swollen area on her face is still there. She also states she is tender around the area where her spleen is

## 2014-12-21 NOTE — Telephone Encounter (Signed)
Pt called today to let you know that she is still experiencing the swollen lymph nodes.  In addition to the ones you already examined, she said she can feel some swelling in her abdomen now & is having some abdominal pain.  Your note said you would proceed with an ultrasound & labs but I didn't know what you would want to do with her abd pain now.  Please advise.

## 2014-12-22 ENCOUNTER — Ambulatory Visit (INDEPENDENT_AMBULATORY_CARE_PROVIDER_SITE_OTHER): Payer: BLUE CROSS/BLUE SHIELD

## 2014-12-22 DIAGNOSIS — K116 Mucocele of salivary gland: Secondary | ICD-10-CM | POA: Diagnosis not present

## 2014-12-22 DIAGNOSIS — R221 Localized swelling, mass and lump, neck: Secondary | ICD-10-CM | POA: Diagnosis not present

## 2014-12-22 NOTE — Telephone Encounter (Signed)
Pt notified to get labs and that an ultrasound has been ordered.

## 2014-12-22 NOTE — Telephone Encounter (Signed)
Amber, Lab orders are in [employer] for a lab only visit, also can you please notify patient that an order has been placed for an ultrasound of the neck.

## 2014-12-23 ENCOUNTER — Telehealth: Payer: Self-pay | Admitting: Family Medicine

## 2014-12-23 ENCOUNTER — Ambulatory Visit (INDEPENDENT_AMBULATORY_CARE_PROVIDER_SITE_OTHER): Payer: BLUE CROSS/BLUE SHIELD

## 2014-12-23 DIAGNOSIS — R221 Localized swelling, mass and lump, neck: Secondary | ICD-10-CM

## 2014-12-23 LAB — CBC WITH DIFFERENTIAL/PLATELET
Basophils Absolute: 0.1 10*3/uL (ref 0.0–0.1)
Basophils Relative: 1 % (ref 0–1)
EOS ABS: 0.4 10*3/uL (ref 0.0–0.7)
EOS PCT: 5 % (ref 0–5)
HEMATOCRIT: 41.8 % (ref 36.0–46.0)
HEMOGLOBIN: 14.3 g/dL (ref 12.0–15.0)
LYMPHS ABS: 2.4 10*3/uL (ref 0.7–4.0)
LYMPHS PCT: 30 % (ref 12–46)
MCH: 31.2 pg (ref 26.0–34.0)
MCHC: 34.2 g/dL (ref 30.0–36.0)
MCV: 91.1 fL (ref 78.0–100.0)
MONOS PCT: 8 % (ref 3–12)
MPV: 13 fL — ABNORMAL HIGH (ref 8.6–12.4)
Monocytes Absolute: 0.6 10*3/uL (ref 0.1–1.0)
Neutro Abs: 4.4 10*3/uL (ref 1.7–7.7)
Neutrophils Relative %: 56 % (ref 43–77)
Platelets: 237 10*3/uL (ref 150–400)
RBC: 4.59 MIL/uL (ref 3.87–5.11)
RDW: 13.8 % (ref 11.5–15.5)
WBC: 7.9 10*3/uL (ref 4.0–10.5)

## 2014-12-23 LAB — COMPLETE METABOLIC PANEL WITH GFR
ALT: 22 U/L (ref 6–29)
AST: 19 U/L (ref 10–35)
Albumin: 3.7 g/dL (ref 3.6–5.1)
Alkaline Phosphatase: 94 U/L (ref 33–130)
BUN: 8 mg/dL (ref 7–25)
CHLORIDE: 92 mmol/L — AB (ref 98–110)
CO2: 20 mmol/L (ref 20–31)
Calcium: 8.8 mg/dL (ref 8.6–10.4)
Creat: 0.68 mg/dL (ref 0.50–1.05)
GFR, Est African American: >89 mL/min (ref >=60)
GFR, Est Non African American: >89 mL/min (ref >=60)
Glucose, Bld: 326 mg/dL — ABNORMAL HIGH (ref 65–99)
Potassium: 4.4 mmol/L (ref 3.5–5.3)
SODIUM: 126 mmol/L — AB (ref 135–146)
Total Bilirubin: 0.6 mg/dL (ref 0.2–1.2)
Total Protein: 6.6 g/dL (ref 6.1–8.1)

## 2014-12-23 LAB — TSH: TSH: 2.438 u[IU]/mL (ref 0.350–4.500)

## 2014-12-23 MED ORDER — IOHEXOL 300 MG/ML  SOLN: 75.0000 mL | Freq: Once | INTRAMUSCULAR | Status: DC | PRN

## 2014-12-23 NOTE — Telephone Encounter (Signed)
Pt notified of results & of the CT order.

## 2014-12-23 NOTE — Telephone Encounter (Signed)
Error

## 2014-12-23 NOTE — Telephone Encounter (Signed)
Amber, Will you please let patient know that her ultrasound showed a cyst in her parotid gland and an enlarged lymph node.  The radiologist reviewing this has recommended a ct scan of the neck to determine if these need to be removed.  Can you please give her the phone number to radiology in order to schedule, I've already placed an order. Her blood cell counts were normal and reassuring, I'm still waiting on the metabolic panel to arrive with results.

## 2014-12-24 ENCOUNTER — Telehealth: Payer: Self-pay | Admitting: Family Medicine

## 2014-12-24 DIAGNOSIS — R739 Hyperglycemia, unspecified: Secondary | ICD-10-CM

## 2014-12-24 MED ORDER — AZITHROMYCIN 250 MG PO TABS: ORAL_TABLET | ORAL | Status: AC

## 2014-12-24 NOTE — Telephone Encounter (Signed)
Amber, Will you please let patient know that her CT scan was reassuring and only showed some enlarged lymph nodes.  I'd recommend trying one more antibiotic, aithromycin, that I've [facility] her [facility] [place] and if this doesn't resolve her symptoms the next step would be a referral to ENT.

## 2014-12-24 NOTE — Telephone Encounter (Signed)
Also, Blood sugar was significantly elevated along with a low sodium level.  These two findings may be related.  I'd recommend she recheck this along with a 3 month average blood sugar on Monday of next week.  Lab slip in your in box.

## 2014-12-24 NOTE — Telephone Encounter (Signed)
Pt notified of all results & of new rx.

## 2015-01-08 ENCOUNTER — Other Ambulatory Visit: Payer: Self-pay | Admitting: Family Medicine

## 2015-02-03 ENCOUNTER — Other Ambulatory Visit: Payer: Self-pay | Admitting: Family Medicine

## 2015-02-07 ENCOUNTER — Ambulatory Visit (INDEPENDENT_AMBULATORY_CARE_PROVIDER_SITE_OTHER): Payer: BLUE CROSS/BLUE SHIELD | Admitting: Family Medicine

## 2015-02-07 ENCOUNTER — Encounter: Payer: Self-pay | Admitting: Family Medicine

## 2015-02-07 VITALS — BP 113/74 | HR 91 | Wt 186.0 lb

## 2015-02-07 DIAGNOSIS — I10 Essential (primary) hypertension: Secondary | ICD-10-CM | POA: Diagnosis not present

## 2015-02-07 DIAGNOSIS — R0982 Postnasal drip: Secondary | ICD-10-CM

## 2015-02-07 DIAGNOSIS — D582 Other hemoglobinopathies: Secondary | ICD-10-CM

## 2015-02-07 DIAGNOSIS — E119 Type 2 diabetes mellitus without complications: Secondary | ICD-10-CM | POA: Diagnosis not present

## 2015-02-07 LAB — HEMOGLOBIN: HEMOGLOBIN: 14.6 g/dL (ref 12.0–15.0)

## 2015-02-07 MED ORDER — FLUCONAZOLE 150 MG PO TABS: ORAL_TABLET | ORAL | Status: AC

## 2015-02-07 MED ORDER — MONTELUKAST SODIUM 10 MG PO TABS: 10.0000 mg | ORAL_TABLET | Freq: Every day | ORAL | Status: DC

## 2015-02-07 NOTE — Progress Notes (Signed)
CC: Sara Leon is a 49 y.o. female is here for Diabetes   Subjective: HPI:  Follow-up type 2 diabetes: Last month her random blood sugar was found to be in the 300s. She has no other outside blood sugars to report but has had recurring vaginal yeast infections. She denies polyuria polyphagia or polydipsia.  Follow-up essential hypertension: Continues to take atenolol on a daily basis. No chest pain shortness of breath orthopnea or peripheral edema. No outside blood pressures to report  Complaints of drainage in the back of her throat that's been present on a daily basis for the last month. It's worse when lying down at night. It seems to also be worsening hot environments. Slightly improved from fexofenadine, no benefit from Zyrtec. No other interventions as of yet. She would like to avoid any medication that is taken nasally. Denies fevers, chills, no shortness of breath.    Review Of Systems Outlined In HPI  Past Medical History  Diagnosis Date  . Anxiety   . GERD (gastroesophageal reflux disease)   . Depression   . 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 Pancreatitis chronic     on resection specimen  . Deep venous thrombosis of upper extremity 2011    due to PICC  . Pancreatic insufficiency 10/16/2010  . Fatty liver     appears improved on imaging after weight loss  . Diabetes mellitus 10/2008    diet controlled  . Hyperlipidemia   . Hypertension   . Condyloma acuminatum of vulva   . Osteoarthritis of hand     bilat 2nd and 3rd fingers at [facility], PIP  . Wears contact lenses   . Routine gynecological examination     Dr. Elonda Husky, Linna Hoff  . Bladder incontinence     Urology, pending studies 05/2012    Past Surgical History  Procedure Laterality Date  . Pancreaticoduodenectomy  05/2009     pancreatic intrepithelial neoplasia types 1A and 1B (Dr. Eugenia Pancoast)  . Tonsillectomy  1971  . Cholecystectomy  2000  . Dobbhoff feeding tube   07/04/09    [facility]  . Upper gastrointestinal endoscopy  2005 and 2010 - [place], [place]    gastritis 2005 and 2010, retained food 2005, no H. pyloi and duodenal bxs normal  . Upper endoscopic ultrasound w/ fna  05/12/2009    Uncinate process mass - Dr. Jerene Pitch  . Pancreaticoduodenectomy  06/10/09    [facility], Dr. Eugenia Pancoast  . Abdominal hysterectomy  1987    Partial due to heavy bleeding, still has her ovaries   Family History  Problem Relation Age of Onset  . Crohn's disease      nephew  . Colon cancer Maternal Grandmother   . Cancer Maternal Grandmother     colon  . Cancer Mother 82    died of melanoma  . Hypertension Father   . Heart disease Father     MI  . Hyperlipidemia Father   . Other Father     died in [place]  . Cancer Maternal Grandfather     colon  . Heart disease Paternal Grandfather     Social History   Social History  . Marital Status: Married    Spouse Name: N/A  . Number of Children: 1  . Years of Education: N/A   Occupational History  . 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 Smoking status: Former Smoker -- 1.00 packs/day  .  Smokeless tobacco: Never Used  . Alcohol Use: No     Comment: started AA 05/2012.  Last drink early January  . Drug Use: No  . Sexual Activity:    Partners: Male   Other Topics Concern  . 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  Exercises at the [employer]     Objective: BP 113/74 mmHg  Pulse 91  Wt 186 lb (84.369 kg)  General: Alert and Oriented, No Acute Distress HEENT: Pupils equal, round, reactive to light. Conjunctivae clear. Pink inferior turbinates.  Moist mucous membranes, pharynx without inflammation nor lesions.  Neck supple without palpable lymphadenopathy nor abnormal masses. Lungs:  clear and comfortable work of breathing Cardiac: Regular rate and rhythm.  Extremities: No peripheral edema.  Strong  peripheral pulses.  Mental Status: No depression, anxiety, nor agitation. Skin: Warm and dry.  Assessment & Plan: Ailene was seen today for diabetes.  Diagnoses and all orders for this visit:  Type 2 diabetes mellitus without complication -     POCT HgB A1C -     Hemoglobin A1C -     Hemoglobin  Essential hypertension -     Hemoglobin  Postnasal drip -     montelukast (SINGULAIR) 10 MG tablet; Take 1 tablet (10 mg total) by mouth at bedtime.  Elevated hemoglobin -     Hemoglobin  Other orders -     fluconazole (DIFLUCAN) 150 MG tablet; Take one tab, may take second tab if no improvement after 72 hours.    type 2 diabetes: Anticipate this to be uncontrolled, A1c was unable to be measured with finger stick today, an error message suggested that her hemoglobin level is too high and therefore hemoglobin will be checked as well with venipuncture.   essential hypertension: Controlled continue atenolol Postnasal drip: Start Singulair continue fexofenadine. Diflucan to help with vaginal yeast infections.  Return in about 3 months (around 05/09/2015).

## 2015-02-08 ENCOUNTER — Telehealth: Payer: Self-pay | Admitting: Family Medicine

## 2015-02-08 LAB — HEMOGLOBIN A1C
HEMOGLOBIN A1C: 12.5 % — AB (ref <5.7)
Mean Plasma Glucose: 312 mg/dL — ABNORMAL HIGH (ref <117)

## 2015-02-08 MED ORDER — METFORMIN HCL 1000 MG PO TABS: ORAL_TABLET | ORAL | Status: DC

## 2015-02-08 NOTE — Telephone Encounter (Signed)
Seth Bake, Will you please let patient know that her A1c was 12.5 with a goal of less than 7.  I'd recommend she start on a twice a day regimen of metformin that I've sent to her pharmacy and f/u in three months to recheck blood sugar.  The error message the original finger stick test resulted in was an error on the part of the machine and did not reflect any abnormal hemoglobin level, this was confirmed with her blood test from the lab.

## 2015-02-08 NOTE — Telephone Encounter (Signed)
Pt.notified

## 2015-02-16 ENCOUNTER — Ambulatory Visit: Payer: BLUE CROSS/BLUE SHIELD | Admitting: Family Medicine

## 2015-02-21 ENCOUNTER — Telehealth: Payer: Self-pay | Admitting: *Deleted

## 2015-02-21 NOTE — Telephone Encounter (Signed)
Pt called and states she has had blurred vision for the past 3-4 days. Pt's last A1c was 12.5 and at the time of that visit on 9/26 she did not have blurred vision.She has been taking the metformin that was rx'd consistently  Pt denies nausea or vomiting.She She does state that she has a HA that starts at the base of her head and radiates upward.Pt does state that her anxiety  level is high and has been ever since she was informed that her A1c was elevated.She states she feels anxious even after taking a xanax.  I did speak with Dr. Sheppard Coil to determine if pt needed to be seen urgently. Since pt has no nausea or vomiting she does not need to go to ER. Pt transferred up front to schedule appt

## 2015-02-22 ENCOUNTER — Ambulatory Visit (INDEPENDENT_AMBULATORY_CARE_PROVIDER_SITE_OTHER): Payer: BLUE CROSS/BLUE SHIELD | Admitting: Family Medicine

## 2015-02-22 ENCOUNTER — Other Ambulatory Visit: Payer: Self-pay | Admitting: Family Medicine

## 2015-02-22 ENCOUNTER — Encounter: Payer: Self-pay | Admitting: Family Medicine

## 2015-02-22 VITALS — BP 127/78 | HR 55 | Temp 98.7°F | Wt 184.0 lb

## 2015-02-22 DIAGNOSIS — E118 Type 2 diabetes mellitus with unspecified complications: Secondary | ICD-10-CM

## 2015-02-22 DIAGNOSIS — R110 Nausea: Secondary | ICD-10-CM

## 2015-02-22 DIAGNOSIS — H538 Other visual disturbances: Secondary | ICD-10-CM

## 2015-02-22 NOTE — Patient Instructions (Signed)
Decrease metformin to half a tab in AM and half a tab in PM Start with 10 units at bedtime. Increase by 1 or 2 units each night until the morning sugar is under 130.

## 2015-02-22 NOTE — Progress Notes (Signed)
Subjective:    Patient ID: Sara Leon, female    DOB: 03/19/1966, 49 y.o.   MRN: [id number]  HPI C/o of blurry vision and nausea for several days (5-6 days).  Sugars running between 100 to mid 200s. Last A1C was 12.5 about 2 weeks ago. Has been getting some headaches. Has been laying down. HA starts in the back of her head.  Not taking any meds for the headache.  She feels like she is light sensitive.  Her metformin was increased to 1000mg  BID.  No new medications.  No abdominal pain. No change in bolwe.s   Hx of pancreatic cancer 2010 ans that is when she became diabetic. She tookd the med for yeast infection a couple of weeks ago. No vomiting  Has eye appt scheduled for Monday the 24th.  She says she has actually upped her fluid intake starting about 2 weeks ago.    Review of Systems  BP 127/78 mmHg  Pulse 55  Temp(Src) 98.7 F (37.1 C)  Wt 184 lb (83.462 kg)  SpO2 96%    Allergies  Allergen Reactions  . Quinapril     cough  . Montelukast Cough    Past Medical History  Diagnosis Date  . Anxiety   . GERD (gastroesophageal reflux disease)   . Depression   . 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 Pancreatitis chronic     on resection specimen  . Deep venous thrombosis of upper extremity ([place]) 2011    due to PICC  . Pancreatic insufficiency ([place]) 10/16/2010  . Fatty liver     appears improved on imaging after weight loss  . Diabetes mellitus 10/2008    diet controlled  . Hyperlipidemia   . Hypertension   . Condyloma acuminatum of vulva   . Osteoarthritis of hand     bilat 2nd and 3rd fingers at MCP, PIP  . Wears contact lenses   . Routine gynecological examination     Dr. Elonda Husky, [place]  . Bladder incontinence     Urology, pending studies 05/2012    Past Surgical History  Procedure Laterality Date  . Pancreaticoduodenectomy  05/2009     pancreatic intrepithelial neoplasia types 1A and 1B (Dr. Eugenia Pancoast)  .  Tonsillectomy  1971  . Cholecystectomy  2000  . Dobbhoff feeding tube  07/04/09    [facility]  . Upper gastrointestinal endoscopy  2005 and 2010 - [place], [place]    gastritis 2005 and 2010, retained food 2005, no H. pyloi and duodenal bxs normal  . Upper endoscopic ultrasound w/ fna  05/12/2009    Uncinate process mass - Dr. Jerene Pitch  . Pancreaticoduodenectomy  06/10/09    [facility], Dr. Eugenia Pancoast  . Abdominal hysterectomy  1987    Partial due to heavy bleeding, still has her ovaries    Social History   Social History  . Marital Status: Married    Spouse Name: N/A  . Number of Children: 1  . Years of Education: N/A   Occupational History  . 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 Smoking status: Current Every Day Smoker -- 1.00 packs/day  . Smokeless tobacco: Never Used  . Alcohol Use: No     Comment: started AA 05/2012.  Last drink early January  . Drug Use: No  . Sexual Activity:    Partners: Male   Other Topics Concern  . 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  Exercises at the [employer]    Family History  Problem Relation Age of Onset  . Crohn's disease      nephew  . Colon cancer Maternal Grandmother   . Cancer Maternal Grandmother     colon  . Cancer Mother 28    died of melanoma  . Hypertension Father   . Heart disease Father     MI  . Hyperlipidemia Father   . Other Father     died in [place]  . Cancer Maternal Grandfather     colon  . Heart disease Paternal Grandfather     Outpatient Encounter Prescriptions as of 02/22/2015  Medication Sig  . ALPRAZolam (XANAX) 1 MG tablet TAKE 1 TABLET BY MOUTH TWICE A DAY AS NEEDED FOR ANXIETY  . atenolol (TENORMIN) 50 MG tablet Take 1.5 tablets (75 mg total) by mouth daily.  . metFORMIN (GLUCOPHAGE) 1000 MG tablet One by mouth twice a day for blood sugar control.  . risperiDONE (RISPERDAL) 1 MG tablet Take  1 tablet (1 mg total) by mouth daily. Will be due for a f/u in November  . venlafaxine XR (EFFEXOR-XR) 150 MG 24 hr capsule Take 1 capsule (150 mg total) by mouth daily with breakfast.  . venlafaxine XR (EFFEXOR-XR) 75 MG 24 hr capsule TAKE ONE CAPSULE BY MOUTH DAILY WITH BREAKFAST.  . [DISCONTINUED] CINNAMON PO Take by mouth.  . [DISCONTINUED] montelukast (SINGULAIR) 10 MG tablet Take 1 tablet (10 mg total) by mouth at bedtime.   Facility-Administered Encounter Medications as of 02/22/2015  Medication  . iohexol (OMNIPAQUE) 300 MG/ML solution 75 mL          Objective:   Physical Exam  Constitutional: She is oriented to person, place, and time. She appears well-developed and well-nourished.  HENT:  Head: Normocephalic and atraumatic.  Right Ear: External ear normal.  Left Ear: External ear normal.  Nose: Nose normal.  Mouth/Throat: Oropharynx is clear and moist.  TMs and canals are clear.   Eyes: Conjunctivae and EOM are normal. Pupils are equal, round, and reactive to light.  Neck: Neck supple. No thyromegaly present.  Cardiovascular: Normal rate, regular rhythm and normal heart sounds.   Pulmonary/Chest: Effort normal and breath sounds normal. She has no wheezes.  Abdominal: Soft. Bowel sounds are normal. She exhibits no distension and no mass. There is no tenderness. There is no rebound and no guarding.  Musculoskeletal: She exhibits no edema.  Lymphadenopathy:    She has no cervical adenopathy.  Neurological: She is alert and oriented to person, place, and time.  Skin: Skin is warm and dry.  Psychiatric: She has a normal mood and affect.        Assessment & Plan:  Blurry vision - eye exam is abnormal. Encouraged her to get in with her eye doc. ASAP. She says she has already made an appt.  certainly be dropping sugars can cause changes in swelling in the cornea which can affect vision but since she is only able to read 20/70 with both eyes on the eye chart I encouraged her  to go ahead and make an eye appointment sooner rather than later. She agrees and will do so.  Nausea-most likely side effect from increased dose on the metformin. We will cut her back down to 500 mg and add 10 units of Lantus at bedtime. She will go up 1-2 units each  night until her blood sugars in the morning are under 130 fasting. Also consider electrolyte imbalance etc. We'll check labs today. Given instruction on how to use the Lantus pen in the office here today. Self administered first 10 units.  Diabetes - seen note above. No sign of dehydration and no vomiting. Will check labs today and call.

## 2015-02-23 ENCOUNTER — Encounter: Payer: Self-pay | Admitting: Family Medicine

## 2015-02-23 LAB — COMPLETE METABOLIC PANEL WITH GFR
ALBUMIN: 4.2 g/dL (ref 3.6–5.1)
ALK PHOS: 74 U/L (ref 33–130)
ALT: 36 U/L — ABNORMAL HIGH (ref 6–29)
AST: 27 U/L (ref 10–35)
BUN: 10 mg/dL (ref 7–25)
CALCIUM: 9.4 mg/dL (ref 8.6–10.4)
CO2: 26 mmol/L (ref 20–31)
Chloride: 100 mmol/L (ref 98–110)
Creat: 0.68 mg/dL (ref 0.50–1.05)
Glucose, Bld: 91 mg/dL (ref 65–99)
POTASSIUM: 4.4 mmol/L (ref 3.5–5.3)
Sodium: 138 mmol/L (ref 135–146)
Total Bilirubin: 0.7 mg/dL (ref 0.2–1.2)
Total Protein: 7 g/dL (ref 6.1–8.1)

## 2015-02-23 LAB — CBC WITH DIFFERENTIAL/PLATELET
BASOS PCT: 1 % (ref 0–1)
Basophils Absolute: 0.1 10*3/uL (ref 0.0–0.1)
EOS ABS: 0.6 10*3/uL (ref 0.0–0.7)
Eosinophils Relative: 8 % — ABNORMAL HIGH (ref 0–5)
HCT: 42.1 % (ref 36.0–46.0)
Hemoglobin: 14.3 g/dL (ref 12.0–15.0)
Lymphocytes Relative: 39 % (ref 12–46)
Lymphs Abs: 3 10*3/uL (ref 0.7–4.0)
MCH: 30.5 pg (ref 26.0–34.0)
MCHC: 34 g/dL (ref 30.0–36.0)
MCV: 89.8 fL (ref 78.0–100.0)
MONOS PCT: 7 % (ref 3–12)
MPV: 11.7 fL (ref 8.6–12.4)
Monocytes Absolute: 0.5 10*3/uL (ref 0.1–1.0)
NEUTROS PCT: 45 % (ref 43–77)
Neutro Abs: 3.5 10*3/uL (ref 1.7–7.7)
PLATELETS: 263 10*3/uL (ref 150–400)
RBC: 4.69 MIL/uL (ref 3.87–5.11)
RDW: 14 % (ref 11.5–15.5)
WBC: 7.7 10*3/uL (ref 4.0–10.5)

## 2015-02-23 LAB — TSH: TSH: 3.261 u[IU]/mL (ref 0.350–4.500)

## 2015-02-23 LAB — HEPATITIS PANEL, ACUTE
HCV AB: NEGATIVE
Hep A IgM: NONREACTIVE
Hep B C IgM: NONREACTIVE
Hepatitis B Surface Ag: NEGATIVE

## 2015-02-23 LAB — AMYLASE: AMYLASE: 27 U/L (ref 0–105)

## 2015-02-23 LAB — LIPASE: Lipase: 15 U/L (ref 7–60)

## 2015-03-08 ENCOUNTER — Ambulatory Visit (INDEPENDENT_AMBULATORY_CARE_PROVIDER_SITE_OTHER): Payer: BLUE CROSS/BLUE SHIELD | Admitting: Family Medicine

## 2015-03-08 ENCOUNTER — Telehealth: Payer: Self-pay | Admitting: Family Medicine

## 2015-03-08 ENCOUNTER — Encounter: Payer: Self-pay | Admitting: Family Medicine

## 2015-03-08 VITALS — BP 135/86 | HR 63 | Wt 186.0 lb

## 2015-03-08 DIAGNOSIS — I10 Essential (primary) hypertension: Secondary | ICD-10-CM | POA: Diagnosis not present

## 2015-03-08 DIAGNOSIS — E118 Type 2 diabetes mellitus with unspecified complications: Secondary | ICD-10-CM

## 2015-03-08 MED ORDER — INSULIN PEN NEEDLE 31G X 5 MM MISC: Status: DC

## 2015-03-08 MED ORDER — AMBULATORY NON FORMULARY MEDICATION: Status: AC

## 2015-03-08 MED ORDER — METFORMIN HCL 1000 MG PO TABS: 500.0000 mg | ORAL_TABLET | Freq: Two times a day (BID) | ORAL | Status: DC

## 2015-03-08 NOTE — Progress Notes (Signed)
CC: Sara Leon is a 49 y.o. female is here for Hyperglycemia   Subjective: HPI:  Follow-up type 2 diabetes: She has self titrated her insulin to 14 units every evening. She has seen a fasting blood sugar this morning of 96. She still states that her blood sugar will spike during the day into the mid 200s. Sheh as made a huge effort at reducing simple sugars in her diet. She is walking more often now for exercise. Her vision is significantly improving in her mind but not back to baseline. She has not had time to follow-up with her eye doctor yet. She tells me that physically she feels incredibly better ever since starting on Lantus.  Follow-up/hypertension: She is concerned that her blood pressure was 732 systolic earlier this week. She denies any chest pain shortness of breath orthopnea nor peripheral edema   Review Of Systems Outlined In HPI  Past Medical History  Diagnosis Date  . Anxiety   . GERD (gastroesophageal reflux disease)   . Depression   . 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 Pancreatitis chronic     on resection specimen  . Deep venous thrombosis of upper extremity ([place]) 2011    due to PICC  . Pancreatic insufficiency ([place]) 10/16/2010  . Fatty liver     appears improved on imaging after weight loss  . Diabetes mellitus 10/2008    diet controlled  . Hyperlipidemia   . Hypertension   . Condyloma acuminatum of vulva   . Osteoarthritis of hand     bilat 2nd and 3rd fingers at MCP, PIP  . Wears contact lenses   . Routine gynecological examination     Dr. Elonda Leon, [place] Leon  . Bladder incontinence     Urology, pending studies 05/2012    Past Surgical History  Procedure Laterality Date  . Pancreaticoduodenectomy  05/2009     pancreatic intrepithelial neoplasia types 1A and 1B (Dr. Eugenia Leon)  . Tonsillectomy  1971  . Cholecystectomy  2000  . Dobbhoff feeding tube  07/04/09    Sara Leon  . Upper gastrointestinal  endoscopy  2005 and 2010 - [place], [place]    gastritis 2005 and 2010, retained food 2005, no H. pyloi and duodenal bxs normal  . Upper endoscopic ultrasound w/ fna  05/12/2009    Uncinate process mass - Sara Leon  . Pancreaticoduodenectomy  06/10/09    [facility], Dr. Eugenia Leon  . Abdominal hysterectomy  1987    Partial due to heavy bleeding, still has her ovaries   Family History  Problem Relation Age of Onset  . Crohn's disease      nephew  . Colon cancer Maternal Grandmother   . Cancer Maternal Grandmother     colon  . Cancer Mother 64    died of melanoma  . Hypertension Father   . Heart disease Father     MI  . Hyperlipidemia Father   . Other Father     died in [place]  . Cancer Maternal Grandfather     colon  . Heart disease Paternal Grandfather     Social History   Social History  . Marital Status: Married    Spouse Name: N/A  . Number of Children: 1  . Years of Education: N/A   Occupational History  . 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 Smoking status: Current Every Day Smoker -- 1.00 packs/day  . Smokeless tobacco: Never  Used  . Alcohol Use: No     Comment: started AA 05/2012.  Last drink early January  . Drug Use: No  . Sexual Activity:    Partners: Male   Other Topics Concern  . 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  Exercises at the [place]     Objective: BP 135/86 mmHg  Pulse 63  Wt 186 lb (84.369 kg)  Vital signs reviewed. General: Alert and Oriented, No Acute Distress HEENT: Pupils equal, round, reactive to light. Conjunctivae clear.  External ears unremarkable.  Moist mucous membranes. Lungs: Clear and comfortable work of breathing, speaking in full sentences without accessory muscle use. Cardiac: Regular rate and rhythm.  Neuro: CN II-XII grossly intact, gait normal. Extremities: No peripheral edema.  Strong peripheral pulses.   Mental Status: No depression, anxiety, nor agitation. Logical though process. Skin: Warm and dry. Assessment & Plan: Sara Leon was seen today for hyperglycemia.  Diagnoses and all orders for this visit:  Essential hypertension  Type 2 diabetes mellitus with complication, without long-term current use of insulin (HCC) -     metFORMIN (GLUCOPHAGE) 1000 MG tablet; Take 0.5 tablets (500 mg total) by mouth 2 (two) times daily with a meal. One by mouth twice a day for blood sugar control. -     AMBULATORY NON FORMULARY MEDICATION; Diabetic testing strips: OneTouch Verio: Use to check blood sugar up to three times a day. -     Insulin Pen Needle (B-D UF III MINI PEN NEEDLES) 31G X 5 MM MISC; Use to inject insulin daily.   Essential hypertension: Reassurance provided that her blood pressure has been normal and that the prehypertensive readings that she was seen outside of our office does not require increasing her atenolol. Type 2 diabetes: Significant improving with Lantus. Continue 14 units daily, I also recommended they believe it safe for her to titrate this in the future if blood sugars start climbing 120 fasting.  25 minutes spent face-to-face during visit today of which at least 50% was counseling or coordinating care regarding: 1. Essential hypertension   2. Type 2 diabetes mellitus with complication, without long-term current use of insulin ([place])       Return in about 3 months (around 06/08/2015).

## 2015-03-09 ENCOUNTER — Other Ambulatory Visit: Payer: Self-pay | Admitting: *Deleted

## 2015-03-09 DIAGNOSIS — R748 Abnormal levels of other serum enzymes: Secondary | ICD-10-CM

## 2015-03-11 ENCOUNTER — Other Ambulatory Visit: Payer: Self-pay

## 2015-03-11 MED ORDER — ALPRAZOLAM 1 MG PO TABS: 1.0000 mg | ORAL_TABLET | Freq: Two times a day (BID) | ORAL | Status: DC | PRN

## 2015-03-16 MED ORDER — INSULIN GLARGINE 300 UNIT/ML ~~LOC~~ SOPN: 14.0000 [IU] | PEN_INJECTOR | Freq: Every day | SUBCUTANEOUS | Status: DC

## 2015-03-16 NOTE — Telephone Encounter (Signed)
Pt reports that the cost of the medication that was prescribed for her diabetes last week is too high for her pay for.  Should wants to know can you send in she get the insulin vials and needles sent to pharmacy.

## 2015-03-16 NOTE — Telephone Encounter (Signed)
I have another idea that might be even cheaper than the vials.  I sent a Rx of a newer version of her insulin called Toujeo. There is a promotion going on for the next year that brings the copay to $15-25 every refill.  Can you please ask her to swing by for a savings voucher (on fridge in the lab) before going to pick this up.  If this doesn't work then [employer] get the the vials Rx.

## 2015-03-17 NOTE — Telephone Encounter (Signed)
Pt advised.

## 2015-04-03 ENCOUNTER — Other Ambulatory Visit: Payer: Self-pay | Admitting: Family Medicine

## 2015-04-10 ENCOUNTER — Other Ambulatory Visit: Payer: Self-pay | Admitting: Family Medicine

## 2015-04-13 ENCOUNTER — Other Ambulatory Visit: Payer: Self-pay | Admitting: Family Medicine

## 2015-04-14 ENCOUNTER — Other Ambulatory Visit: Payer: Self-pay

## 2015-04-14 MED ORDER — RISPERIDONE 1 MG PO TABS: 1.0000 mg | ORAL_TABLET | Freq: Every day | ORAL | Status: DC

## 2015-05-06 ENCOUNTER — Encounter: Payer: Self-pay | Admitting: Family Medicine

## 2015-05-06 ENCOUNTER — Ambulatory Visit (INDEPENDENT_AMBULATORY_CARE_PROVIDER_SITE_OTHER): Payer: BLUE CROSS/BLUE SHIELD | Admitting: Family Medicine

## 2015-05-06 VITALS — BP 121/86 | HR 68 | Wt 186.0 lb

## 2015-05-06 DIAGNOSIS — Z23 Encounter for immunization: Secondary | ICD-10-CM | POA: Diagnosis not present

## 2015-05-06 DIAGNOSIS — Z794 Long term (current) use of insulin: Secondary | ICD-10-CM

## 2015-05-06 DIAGNOSIS — E118 Type 2 diabetes mellitus with unspecified complications: Secondary | ICD-10-CM

## 2015-05-06 DIAGNOSIS — R0982 Postnasal drip: Secondary | ICD-10-CM | POA: Diagnosis not present

## 2015-05-06 DIAGNOSIS — F418 Other specified anxiety disorders: Secondary | ICD-10-CM | POA: Diagnosis not present

## 2015-05-06 LAB — POCT GLYCOSYLATED HEMOGLOBIN (HGB A1C): HEMOGLOBIN A1C: 7

## 2015-05-06 MED ORDER — ATENOLOL 50 MG PO TABS: ORAL_TABLET | ORAL | Status: DC

## 2015-05-06 MED ORDER — MOMETASONE FUROATE 50 MCG/ACT NA SUSP: NASAL | Status: DC

## 2015-05-06 MED ORDER — VENLAFAXINE HCL ER 75 MG PO CP24: ORAL_CAPSULE | ORAL | Status: DC

## 2015-05-06 MED ORDER — METFORMIN HCL 1000 MG PO TABS: 500.0000 mg | ORAL_TABLET | Freq: Two times a day (BID) | ORAL | Status: DC

## 2015-05-06 MED ORDER — ALPRAZOLAM 1 MG PO TABS: 1.0000 mg | ORAL_TABLET | Freq: Two times a day (BID) | ORAL | Status: DC | PRN

## 2015-05-06 MED ORDER — VENLAFAXINE HCL ER 150 MG PO CP24: 150.0000 mg | ORAL_CAPSULE | Freq: Every day | ORAL | Status: DC

## 2015-05-06 MED ORDER — RISPERIDONE 1 MG PO TABS: 1.0000 mg | ORAL_TABLET | Freq: Every day | ORAL | Status: DC

## 2015-05-06 NOTE — Progress Notes (Signed)
CC: Sara Leon is a 49 y.o. female is here for Medication Refill   Subjective: HPI:  Follow-up type 2 diabetes: She is trying her best to keep her blood sugar down. No outside blood sugars to report. She is using toujeo every day without any hypoglycemic episodes and financially this is not hurting her. Denies polyuria by pager or polydipsia  Follow-up depression and anxiety. She is requesting refills on Effexor and Risperdal. She tells me that both of them are doing a really good job at keeping her depression and anxiety nonexistent. No thoughts of wanting to harm self or others. She denies any known side effects  Singulair did not seem to help her postnasal drip whatsoever. Symptoms are worse at night. She is having nasal congestion that comes down the back of her throat and anteriorly through her nostrils.denies fevers, chills, cough or wheezing. Occasionally has sore throat in the morning. Symptoms have beenpersistent since the summer.   Review Of Systems Outlined In HPI  Past Medical History  Diagnosis Date  . Anxiety   . GERD (gastroesophageal reflux disease)   . Depression   . 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 Pancreatitis chronic     on resection specimen  . Deep venous thrombosis of upper extremity ([place]) 2011    due to PICC  . Pancreatic insufficiency ([place]) 10/16/2010  . Fatty liver     appears improved on imaging after weight loss  . Diabetes mellitus 10/2008    diet controlled  . Hyperlipidemia   . Hypertension   . Condyloma acuminatum of vulva   . Osteoarthritis of hand     bilat 2nd and 3rd fingers at MCP, PIP  . Wears contact lenses   . Routine gynecological examination     Dr. Elonda Husky, Linna Hoff  . Bladder incontinence     Urology, pending studies 05/2012    Past Surgical History  Procedure Laterality Date  . Pancreaticoduodenectomy  05/2009     pancreatic intrepithelial neoplasia types 1A and 1B (Dr.  Eugenia Pancoast)  . Tonsillectomy  1971  . Cholecystectomy  2000  . Dobbhoff feeding tube  07/04/09    [facility]  . Upper gastrointestinal endoscopy  2005 and 2010 - [place], [place]    gastritis 2005 and 2010, retained food 2005, no H. pyloi and duodenal bxs normal  . Upper endoscopic ultrasound w/ fna  05/12/2009    Uncinate process mass - Dr. Jerene Pitch  . Pancreaticoduodenectomy  06/10/09    [facility], Dr. Eugenia Pancoast  . Abdominal hysterectomy  1987    Partial due to heavy bleeding, still has her ovaries   Family History  Problem Relation Age of Onset  . Crohn's disease      nephew  . Colon cancer Maternal Grandmother   . Cancer Maternal Grandmother     colon  . Cancer Mother 87    died of melanoma  . Hypertension Father   . Heart disease Father     MI  . Hyperlipidemia Father   . Other Father     died in [place]  . Cancer Maternal Grandfather     colon  . Heart disease Paternal Grandfather     Social History   Social History  . Marital Status: Married    Spouse Name: N/A  . Number of Children: 1  . Years of Education: N/A   Occupational History  . 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  Smoking status: Current Every Day Smoker -- 1.00 packs/day  . Smokeless tobacco: Never Used  . Alcohol Use: No     Comment: started AA 05/2012.  Last drink early January  . Drug Use: No  . Sexual Activity:    Partners: Male   Other Topics Concern  . 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  Exercises at the [employer]     Objective: BP 121/86 mmHg  Pulse 68  Wt 186 lb (84.369 kg)  General: Alert and Oriented, No Acute Distress HEENT: Pupils equal, round, reactive to light. Conjunctivae clear.  External ears unremarkable, canals clear with intact TMs with appropriate landmarks.  Middle ear appears open without effusion. Pink inferior turbinates.  Moist mucous membranes, pharynx  without inflammation nor lesions.  Neck supple without palpable lymphadenopathy nor abnormal masses. Lungs: Clear to auscultation bilaterally, no wheezing/ronchi/rales.  Comfortable work of breathing. Good air movement. Cardiac: Regular rate and rhythm. Normal S1/S2.  No murmurs, rubs, nor gallops.    Mental Status: No depression, anxiety, nor agitation. Skin: Warm and dry.  Assessment & Plan: [name] was seen today for medication refill.  Diagnoses and all orders for this visit:  Type 2 diabetes mellitus with complication, with long-term current use of insulin ([place])  Type 2 diabetes mellitus with complication, without long-term current use of insulin (HCC) -     metFORMIN (GLUCOPHAGE) 1000 MG tablet; Take 0.5 tablets (500 mg total) by mouth 2 (two) times daily with a meal. One by mouth twice a day for blood sugar control.  Depression with anxiety -     venlafaxine XR (EFFEXOR-XR) 150 MG 24 hr capsule; Take 1 capsule (150 mg total) by mouth daily with breakfast. -     venlafaxine XR (EFFEXOR-XR) 75 MG 24 hr capsule; TAKE ONE CAPSULE BY MOUTH DAILY WITH BREAKFAST.  Postnasal drip  Encounter for immunization  Other orders -     risperiDONE (RISPERDAL) 1 MG tablet; Take 1 tablet (1 mg total) by mouth daily. -     ALPRAZolam (XANAX) 1 MG tablet; Take 1 tablet (1 mg total) by mouth 2 (two) times daily as needed for anxiety. -     atenolol (TENORMIN) 50 MG tablet; TAKE 1.5 TABLETS (75 MG TOTAL) BY MOUTH DAILY. -     mometasone (NASONEX) 50 MCG/ACT nasal spray; Two sprays each nostril daily. -     Flu Vaccine QUAD 36+ mos IM   Type 2 diabetes: A1c 7.0, controlled, continue metformin and insulin. Depression and anxiety: Controlled with Effexor and Risperdal, only using Xanax on an as-needed basis Postnasal drip: Start nasal steroid, Nasonex   Return in about 3 months (around 08/04/2015) for Diabetes Follow up.

## 2015-05-11 ENCOUNTER — Telehealth: Payer: Self-pay | Admitting: Family Medicine

## 2015-05-11 NOTE — Telephone Encounter (Signed)
Received fax for prior authorization on Mometasone Furoate sent through cover my meds and waiting on authorization. - CF

## 2015-05-13 NOTE — Telephone Encounter (Signed)
Received fax from [facility] and they denied coverage on Mometasone Furoate due to they only pay for generic if the [facility] provider indicates name brand should not be used. - CF

## 2015-05-17 ENCOUNTER — Ambulatory Visit (INDEPENDENT_AMBULATORY_CARE_PROVIDER_SITE_OTHER): Payer: BLUE CROSS/BLUE SHIELD | Admitting: Osteopathic Medicine

## 2015-05-17 ENCOUNTER — Telehealth: Payer: Self-pay | Admitting: Family Medicine

## 2015-05-17 VITALS — BP 127/76 | HR 61 | Temp 98.0°F | Ht 60.0 in | Wt 190.0 lb

## 2015-05-17 DIAGNOSIS — J069 Acute upper respiratory infection, unspecified: Secondary | ICD-10-CM

## 2015-05-17 DIAGNOSIS — F17200 Nicotine dependence, unspecified, uncomplicated: Secondary | ICD-10-CM | POA: Diagnosis not present

## 2015-05-17 DIAGNOSIS — B9789 Other viral agents as the cause of diseases classified elsewhere: Principal | ICD-10-CM

## 2015-05-17 MED ORDER — METHYLPREDNISOLONE 4 MG PO TBPK: ORAL_TABLET | ORAL | Status: DC

## 2015-05-17 MED ORDER — BENZONATATE 200 MG PO CAPS: 200.0000 mg | ORAL_CAPSULE | Freq: Three times a day (TID) | ORAL | Status: DC | PRN

## 2015-05-17 MED ORDER — IPRATROPIUM BROMIDE 0.03 % NA SOLN: 2.0000 | Freq: Two times a day (BID) | NASAL | Status: DC

## 2015-05-17 MED ORDER — GUAIFENESIN-CODEINE 100-10 MG/5ML PO SYRP: 5.0000 mL | ORAL_SOLUTION | Freq: Every evening | ORAL | Status: DC | PRN

## 2015-05-17 NOTE — Telephone Encounter (Signed)
Will you please let patient know that  [employer] has denied coverage of the mometasone nasal steroid I prescribed.  Since they're being difficult i'd recommend trying OTC Nasacort or Flonase.

## 2015-05-17 NOTE — Progress Notes (Signed)
HPI: Sara Leon is a 50 y.o. female who presents to [facility]  today for chief complaint of:  Chief Complaint  Patient presents with  . Cough   . Location: chest . Quality: coughing, drainage  . Severity: moderate . Duration: 4 days . Context: yes sick contacts . Modifying factors: has tried the following OTC medications: none  without relief . Assoc signs/symptoms: subjective chills, no fever, phlegm/yellow productive cough, Yes  body aches, (+) nausea GI upset   Past medical, social and family history reviewed: Past Medical History  Diagnosis Date  . Anxiety   . GERD (gastroesophageal reflux disease)   . Depression   . 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 Pancreatitis chronic     on resection specimen  . Deep venous thrombosis of upper extremity ([place]) 2011    due to PICC  . Pancreatic insufficiency ([place]) 10/16/2010  . Fatty liver     appears improved on imaging after weight loss  . Diabetes mellitus 10/2008    diet controlled  . Hyperlipidemia   . Hypertension   . Condyloma acuminatum of vulva   . Osteoarthritis of hand     bilat 2nd and 3rd fingers at MCP, PIP  . Wears contact lenses   . Routine gynecological examination     Dr. Elonda Husky, [place] [facility]  . Bladder incontinence     Urology, pending studies 05/2012   Past Surgical History  Procedure Laterality Date  . Pancreaticoduodenectomy  05/2009     pancreatic intrepithelial neoplasia types 1A and 1B (Dr. Eugenia Pancoast)  . Tonsillectomy  1971  . Cholecystectomy  2000  . Dobbhoff feeding tube  07/04/09    [facility]  . Upper gastrointestinal endoscopy  2005 and 2010 - [place], [place]    gastritis 2005 and 2010, retained food 2005, no H. pyloi and duodenal bxs normal  . Upper endoscopic ultrasound w/ fna  05/12/2009    Uncinate process mass - Dr. Jerene Pitch  . Pancreaticoduodenectomy  06/10/09    [facility], Dr. Eugenia Pancoast  . Abdominal  hysterectomy  1987    Partial due to heavy bleeding, still has her ovaries   Social History  Substance Use Topics  . Smoking status: Current Every Day Smoker -- 1.00 packs/day  . Smokeless tobacco: Never Used  . Alcohol Use: No     Comment: started AA 05/2012.  Last drink early January   Family History  Problem Relation Age of Onset  . Crohn's disease      nephew  . Colon cancer Maternal Grandmother   . Cancer Maternal Grandmother     colon  . Cancer Mother 5    died of melanoma  . Hypertension Father   . Heart disease Father     MI  . Hyperlipidemia Father   . Other Father     died in [place]  . Cancer Maternal Grandfather     colon  . Heart disease Paternal Grandfather     Current Outpatient Prescriptions  Medication Sig Dispense Refill  . ALPRAZolam (XANAX) 1 MG tablet Take 1 tablet (1 mg total) by mouth 2 (two) times daily as needed for anxiety. 60 tablet 1  . AMBULATORY NON FORMULARY MEDICATION Diabetic testing strips: OneTouch Verio: Use to check blood sugar up to three times a day. 50 Units 11  . atenolol (TENORMIN) 50 MG tablet TAKE 1.5 TABLETS (75 MG TOTAL) BY MOUTH DAILY. 135 tablet 1  .  Insulin Glargine (TOUJEO SOLOSTAR) 300 UNIT/ML SOPN Inject 14 Units into the skin daily. 3 pen 3  . Insulin Pen Needle (B-D UF III MINI PEN NEEDLES) 31G X 5 MM MISC Use to inject insulin daily. 50 each 11  . metFORMIN (GLUCOPHAGE) 1000 MG tablet Take 0.5 tablets (500 mg total) by mouth 2 (two) times daily with a meal. One by mouth twice a day for blood sugar control. 60 tablet 3  . mometasone (NASONEX) 50 MCG/ACT nasal spray Two sprays each nostril daily. 17 g 2  . risperiDONE (RISPERDAL) 1 MG tablet Take 1 tablet (1 mg total) by mouth daily. 90 tablet 1  . venlafaxine XR (EFFEXOR-XR) 150 MG 24 hr capsule Take 1 capsule (150 mg total) by mouth daily with breakfast. 90 capsule 1  . venlafaxine XR (EFFEXOR-XR) 75 MG 24 hr capsule TAKE ONE CAPSULE BY MOUTH DAILY WITH BREAKFAST. 90 capsule 1    No current facility-administered medications for this visit.   Facility-Administered Medications Ordered in Other Visits  Medication Dose Route Frequency Provider Last Rate Last Dose  . iohexol (OMNIPAQUE) 300 MG/ML solution 75 mL  75 mL Intravenous Once PRN Medication Radiologist, MD       Allergies  Allergen Reactions  . Quinapril     cough  . Montelukast Cough      Review of Systems: CONSTITUTIONAL: subjective  fever/chills HEAD/EYES/EARS/NOSE/THROAT: yes headache, no vision change or hearing change, yes sore throat CARDIAC: No chest pain/pressure/palpitations, no orthopnea RESPIRATORY: yes cough, no shortness of breath GASTROINTESTINAL: yes nausea, no vomiting, no abdominal pain/blood in stool/diarrhea/constipation MUSCULOSKELETAL: yes myalgia/arthralgia   Exam:  BP 127/76 mmHg  Pulse 61  Temp(Src) 98 F (36.7 C) (Oral)  Ht 5' (1.524 m)  Wt 190 lb (86.183 kg)  BMI 37.11 kg/m2 Constitutional: VSS, see above. General Appearance: alert, well-developed, well-nourished, NAD Eyes: Normal lids and conjunctive, non-icteric sclera, PERRLA Ears, Nose, Mouth, Throat: Normal external inspection ears/nares/mouth/lips/gums, normal TM, MMM; posterior pharynx without erythema, without exudate Neck: No masses, trachea midline. No thyroid enlargement/tenderness/mass appreciated, normal lymph nodes Respiratory: Normal respiratory effort. No  wheeze/rhonchi/rales Cardiovascular: S1/S2 normal, no murmur/rub/gallop auscultated. RRR. No carotid bruit or JVD. No lower extremity edema.    ASSESSMENT/PLAN:  Viral URI with cough - Plan: guaiFENesin-codeine (ROBITUSSIN AC) 100-10 MG/5ML syrup, ipratropium (ATROVENT) 0.03 % nasal spray, benzonatate (TESSALON) 200 MG capsule, methylPREDNISolone (MEDROL DOSEPAK) 4 MG TBPK tablet  Tobacco use disorder counseled on quitting    Return if symptoms worsen or fail to improve.

## 2015-05-17 NOTE — Telephone Encounter (Signed)
Pt notified and reports that [employer] has filled the nasal spray

## 2015-05-18 ENCOUNTER — Ambulatory Visit: Payer: BLUE CROSS/BLUE SHIELD | Admitting: Family Medicine

## 2015-05-19 ENCOUNTER — Encounter: Payer: Self-pay | Admitting: Family Medicine

## 2015-05-19 ENCOUNTER — Telehealth: Payer: Self-pay

## 2015-05-19 ENCOUNTER — Ambulatory Visit (INDEPENDENT_AMBULATORY_CARE_PROVIDER_SITE_OTHER): Payer: BLUE CROSS/BLUE SHIELD | Admitting: Family Medicine

## 2015-05-19 VITALS — BP 151/78 | HR 81 | Temp 98.2°F | Ht 60.0 in | Wt 189.0 lb

## 2015-05-19 DIAGNOSIS — J189 Pneumonia, unspecified organism: Secondary | ICD-10-CM

## 2015-05-19 MED ORDER — ALBUTEROL SULFATE (2.5 MG/3ML) 0.083% IN NEBU: 2.5000 mg | INHALATION_SOLUTION | Freq: Four times a day (QID) | RESPIRATORY_TRACT | Status: DC | PRN

## 2015-05-19 NOTE — Telephone Encounter (Signed)
Pt was seen at [facility] last night and was Dx with pneumonia.  Pt c/o SOB is afraid that she may stop breathing during the night.  She is asking should she be seen by a provider or can she just come in for a nurse visit to receive a nebulizer treatment?

## 2015-05-19 NOTE — Telephone Encounter (Signed)
Should be seen to see if the breathing treatment helps and if so we can send her home with a nebulizer.

## 2015-05-19 NOTE — Patient Instructions (Signed)
Thank you for coming in today. We will attempt to get you a nebulizer machine.  Use your albuterol as needed.  Continue the other medicines.  Call or go to the emergency room if you get worse, have trouble breathing, have chest pains, or palpitations.

## 2015-05-19 NOTE — Progress Notes (Signed)
Sara Leon is a 50 y.o. female who presents to [facility] today for wheezing. Patient was seen 2 days ago for viral URI/bronchitis. She was placed on Medrol Dosepak Tessalon Atrovent nasal spray and codeine cough syrup. She worsened and was seen in the emergency room this morning and was diagnosed with community-acquired pneumonia. She was treated with prednisone and a fluoroquinolone-type antibiotic. Additionally she was prescribed albuterol inhaler. Patient notes that she doesn't think she took the Medrol Dosepak that was prescribed on the third. She is pretty well but notes worsening shortness of breath currently. She requests a breathing treatment thinking this will help.   Past Medical History  Diagnosis Date  . Anxiety   . GERD (gastroesophageal reflux disease)   . Depression   . 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 Pancreatitis chronic     on resection specimen  . Deep venous thrombosis of upper extremity ([place]) 2011    due to PICC  . Pancreatic insufficiency ([place]) 10/16/2010  . Fatty liver     appears improved on imaging after weight loss  . Diabetes mellitus 10/2008    diet controlled  . Hyperlipidemia   . Hypertension   . Condyloma acuminatum of vulva   . Osteoarthritis of hand     bilat 2nd and 3rd fingers at [facility], PIP  . Wears contact lenses   . Routine gynecological examination     Dr. Elonda Husky, [place]  . Bladder incontinence     Urology, pending studies 05/2012   Past Surgical History  Procedure Laterality Date  . Pancreaticoduodenectomy  05/2009     pancreatic intrepithelial neoplasia types 1A and 1B (Dr. Eugenia Pancoast)  . Tonsillectomy  1971  . Cholecystectomy  2000  . Dobbhoff feeding tube  07/04/09    [facility]  . Upper gastrointestinal endoscopy  2005 and 2010 - [place], [place]    gastritis 2005 and 2010,  retained food 2005, no H. pyloi and duodenal bxs normal  . Upper endoscopic ultrasound w/ fna  05/12/2009    Uncinate process mass - Dr. Jerene Pitch  . Pancreaticoduodenectomy  06/10/09    [facility], Dr. Eugenia Pancoast  . Abdominal hysterectomy  1987    Partial due to heavy bleeding, still has her ovaries   Social History  Substance Use Topics  . Smoking status: Current Every Day Smoker -- 1.00 packs/day  . Smokeless tobacco: Never Used  . Alcohol Use: No     Comment: started AA 05/2012.  Last drink early January   family history includes Cancer in her maternal grandfather and maternal grandmother; Cancer (age of onset: 72) in her mother; Colon cancer in her maternal grandmother; Heart disease in her father and paternal grandfather; Hyperlipidemia in her father; Hypertension in her father; Other in her father.  ROS as above Medications: Current Outpatient Prescriptions  Medication Sig Dispense Refill  . albuterol (PROVENTIL HFA;VENTOLIN HFA) 108 (90 Base) MCG/ACT inhaler Inhale 1-2 puffs into the lungs.    . ALPRAZolam (XANAX) 1 MG tablet Take 1 tablet (1 mg total) by mouth 2 (two) times daily as needed for anxiety. 60 tablet 1  . AMBULATORY NON FORMULARY MEDICATION Diabetic testing strips: OneTouch Verio: Use to check blood sugar up to three times a day. 50 Units 11  . atenolol (TENORMIN) 50 MG tablet TAKE 1.5 TABLETS (75 MG TOTAL) BY MOUTH DAILY. 135 tablet 1  . benzonatate (TESSALON) 200 MG  capsule Take 1 capsule (200 mg total) by mouth 3 (three) times daily as needed for cough. 30 capsule 0  . guaiFENesin-codeine (ROBITUSSIN AC) 100-10 MG/5ML syrup Take 5-10 mLs by mouth at bedtime as needed for cough. 118 mL 0  . Insulin Glargine (TOUJEO SOLOSTAR) 300 UNIT/ML SOPN Inject 14 Units into the skin daily. 3 pen 3  . Insulin Pen Needle (B-D UF III MINI PEN NEEDLES) 31G X 5 MM MISC Use to inject insulin daily. 50 each 11  . ipratropium (ATROVENT) 0.03 % nasal spray Place 2 sprays into both nostrils  every 12 (twelve) hours. 30 mL 0  . levofloxacin (LEVAQUIN) 500 MG tablet Take 500 mg by mouth.    . metFORMIN (GLUCOPHAGE) 1000 MG tablet Take 0.5 tablets (500 mg total) by mouth 2 (two) times daily with a meal. One by mouth twice a day for blood sugar control. 60 tablet 3  . methylPREDNISolone (MEDROL DOSEPAK) 4 MG TBPK tablet 6-day pack as directed 21 tablet 0  . mometasone (NASONEX) 50 MCG/ACT nasal spray Two sprays each nostril daily. 17 g 2  . predniSONE (DELTASONE) 20 MG tablet Take 40 mg by mouth.    . risperiDONE (RISPERDAL) 1 MG tablet Take 1 tablet (1 mg total) by mouth daily. 90 tablet 1  . venlafaxine XR (EFFEXOR-XR) 150 MG 24 hr capsule Take 1 capsule (150 mg total) by mouth daily with breakfast. 90 capsule 1  . venlafaxine XR (EFFEXOR-XR) 75 MG 24 hr capsule TAKE ONE CAPSULE BY MOUTH DAILY WITH BREAKFAST. 90 capsule 1  . albuterol (PROVENTIL) (2.5 MG/3ML) 0.083% nebulizer solution Take 3 mLs (2.5 mg total) by nebulization every 6 (six) hours as needed for wheezing or shortness of breath. 150 mL 1   No current facility-administered medications for this visit.   Facility-Administered Medications Ordered in Other Visits  Medication Dose Route Frequency Provider Last Rate Last Dose  . iohexol (OMNIPAQUE) 300 MG/ML solution 75 mL  75 mL Intravenous Once PRN Medication Radiologist, MD       Allergies  Allergen Reactions  . Quinapril     cough  . Montelukast Cough     Exam:  BP 151/78 mmHg  Pulse 81  Temp(Src) 98.2 F (36.8 C) (Oral)  Ht 5' (1.524 m)  Wt 189 lb (85.73 kg)  BMI 36.91 kg/m2  SpO2 97% Gen: Well NAD nontoxic HEENT: EOMI,  MMM Lungs: Normal work of breathing. CTABL following nebulized Atrovent and albuterol. Heart: RRR no MRG Abd: NABS, Soft. Nondistended, Nontender Exts: Brisk capillary refill, warm and well perfused.   Patient was given a 2.5/0.5 mg DuoNeb nebulizer treatment and felt much better.  No results found for this or any previous visit (from  the past 24 hour(s)). No results found.   50 year old woman with bronchitis/asthma/COPD with community-acquired pneumonia.  Patient responded very well to nebulized albuterol. We have dispensed a nebulizer and have prescribed the nebulizer albuterol solution. She will continue the current therapeutic plan which seems to be sufficient. Return ASAP if not improving.

## 2015-05-19 NOTE — Telephone Encounter (Signed)
Pt.notified

## 2015-05-23 ENCOUNTER — Ambulatory Visit (INDEPENDENT_AMBULATORY_CARE_PROVIDER_SITE_OTHER): Payer: BLUE CROSS/BLUE SHIELD | Admitting: Family Medicine

## 2015-05-23 ENCOUNTER — Encounter: Payer: Self-pay | Admitting: Family Medicine

## 2015-05-23 VITALS — BP 137/88 | HR 64 | Temp 97.7°F | Wt 189.0 lb

## 2015-05-23 DIAGNOSIS — J069 Acute upper respiratory infection, unspecified: Secondary | ICD-10-CM

## 2015-05-23 DIAGNOSIS — B9789 Other viral agents as the cause of diseases classified elsewhere: Secondary | ICD-10-CM

## 2015-05-23 DIAGNOSIS — J209 Acute bronchitis, unspecified: Secondary | ICD-10-CM | POA: Diagnosis not present

## 2015-05-23 MED ORDER — IPRATROPIUM BROMIDE 0.02 % IN SOLN: 0.5000 mg | Freq: Four times a day (QID) | RESPIRATORY_TRACT | Status: DC

## 2015-05-23 MED ORDER — PREDNISONE 50 MG PO TABS: 50.0000 mg | ORAL_TABLET | Freq: Every day | ORAL | Status: DC

## 2015-05-23 MED ORDER — GUAIFENESIN-CODEINE 100-10 MG/5ML PO SYRP: 5.0000 mL | ORAL_SOLUTION | Freq: Every evening | ORAL | Status: DC | PRN

## 2015-05-23 NOTE — Patient Instructions (Signed)
Thank you for coming in today. Finish Levaquin antibiotic.  Take prednisone 50mg  for 5 days.  Continue albuterol nebulizer.  Use also atrovent nebulizer every 6 hours as needed.  Use cough medicine.  Return Wednesday if not better for work by Thursday.  Call or go to the emergency room if you get worse, have trouble breathing, have chest pains, or palpitations.   Chronic Obstructive Pulmonary Disease Exacerbation Chronic obstructive pulmonary disease (COPD) is a common lung condition in which airflow from the lungs is limited. COPD is a general term that can be used to describe many different lung problems that limit airflow, including chronic bronchitis and emphysema. COPD exacerbations are episodes when breathing symptoms become much worse and require extra treatment. Without treatment, COPD exacerbations can be life threatening, and frequent COPD exacerbations can cause further damage to your lungs. CAUSES  Respiratory infections.  Exposure to smoke.  Exposure to air pollution, chemical fumes, or dust. Sometimes there is no apparent cause or trigger. RISK FACTORS  Smoking cigarettes.  Older age.  Frequent prior COPD exacerbations. SIGNS AND SYMPTOMS  Increased coughing.  Increased thick spit (sputum) production.  Increased wheezing.  Increased shortness of breath.  Rapid breathing.  Chest tightness. DIAGNOSIS Your medical history, a physical exam, and tests will help your health care provider make a diagnosis. Tests may include:  A chest X-ray.  Basic lab tests.  Sputum testing.  An arterial blood gas test. TREATMENT Depending on the severity of your COPD exacerbation, you may need to be admitted to a hospital for treatment. Some of the treatments commonly used to treat COPD exacerbations are:   Antibiotic medicines.  Bronchodilators. These are drugs that expand the air passages. They may be given with an inhaler or nebulizer. Spacer devices may be needed to  help improve drug delivery.  Corticosteroid medicines.  Supplemental oxygen therapy.  Airway clearing techniques, such as noninvasive ventilation (NIV) and positive expiratory pressure (PEP). These provide respiratory support through a mask or other noninvasive device. HOME CARE INSTRUCTIONS  Do not smoke. Quitting smoking is very important to prevent COPD from getting worse and exacerbations from happening as often.  Avoid exposure to all substances that irritate the airway, especially to tobacco smoke.  If you were prescribed an antibiotic medicine, finish it all even if you start to feel better.  Take all medicines as directed by your health care provider.It is important to use correct technique with inhaled medicines.  Drink enough fluids to keep your urine clear or pale yellow (unless you have a medical condition that requires fluid restriction).  Use a cool mist vaporizer. This makes it easier to clear your chest when you cough.  If you have a home nebulizer and oxygen, continue to use them as directed.  Maintain all necessary vaccinations to prevent infections.  Exercise regularly.  Eat a healthy diet.  Keep all follow-up appointments as directed by your health care provider. SEEK IMMEDIATE MEDICAL CARE IF:  You have worsening shortness of breath.  You have trouble talking.  You have severe chest pain.  You have blood in your sputum.  You have a fever.  You have weakness, vomit repeatedly, or faint.  You feel confused.  You continue to get worse. MAKE SURE YOU:  Understand these instructions.  Will watch your condition.  Will get help right away if you are not doing well or get worse.   This information is not intended to replace advice given to you by your health care provider.  Make sure you discuss any questions you have with your health care provider.   Document Released: 02/25/2007 Document Revised: 05/21/2014 Document Reviewed:  01/02/2013 Elsevier Interactive Patient Education [employer].

## 2015-05-23 NOTE — Assessment & Plan Note (Signed)
Bronchitis versus COPD exacerbation. Continue Levaquin. Continue albuterol nebulizer. I added Atrovent nebulized solution, prednisone 50 mg for 5 days, and further cough syrup. Work note extended until Thursday. Patient will return Wednesday (48 hours) if not better. Discussed precautions with patient who expresses understanding and agreement.

## 2015-05-23 NOTE — Progress Notes (Signed)
Sara Leon is a 50 y.o. female who presents to [facility] today for wheezing and cough. Patient was seen last week for pneumonia follow-up. She was thought to have bronchitis earlier last week and was treated with typical viral URI treatment. She did not improve and was seen in the emergency department where she was diagnosed with pneumonia started on Levaquin. In the interim she's done reasonably well with albuterol nebulizers prednisone 20 mg daily and Levaquin. She was sent in from work today because she continues to be wheezing and coughing. She does not feel as though she is ready to return to work. She notes she has to use the albuterol nebulizer every 4 hours or so to prevent significant wheezing. She denies significant chest pain palpitations or significant severe shortness of breath.   Past Medical History  Diagnosis Date  . Anxiety   . GERD (gastroesophageal reflux disease)   . Depression   . 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 Pancreatitis chronic     on resection specimen  . Deep venous thrombosis of upper extremity ([place]) 2011    due to PICC  . Pancreatic insufficiency ([place]) 10/16/2010  . Fatty liver     appears improved on imaging after weight loss  . Diabetes mellitus 10/2008    diet controlled  . Hyperlipidemia   . Hypertension   . Condyloma acuminatum of vulva   . Osteoarthritis of hand     bilat 2nd and 3rd fingers at MCP, PIP  . Wears contact lenses   . Routine gynecological examination     Dr. Elonda Husky, Linna Hoff  . Bladder incontinence     Urology, pending studies 05/2012   Past Surgical History  Procedure Laterality Date  . Pancreaticoduodenectomy  05/2009     pancreatic intrepithelial neoplasia types 1A and 1B (Dr. Eugenia Pancoast)  . Tonsillectomy  1971  . Cholecystectomy  2000  . Dobbhoff feeding tube  07/04/09      [facility]  . Upper gastrointestinal endoscopy  2005 and 2010 - [place], [place]    gastritis 2005 and 2010, retained food 2005, no H. pyloi and duodenal bxs normal  . Upper endoscopic ultrasound w/ fna  05/12/2009    Uncinate process mass - Dr. Jerene Pitch  . Pancreaticoduodenectomy  06/10/09    [facility], Dr. Eugenia Pancoast  . Abdominal hysterectomy  1987    Partial due to heavy bleeding, still has her ovaries   Social History  Substance Use Topics  . Smoking status: Current Every Day Smoker -- 1.00 packs/day  . Smokeless tobacco: Never Used  . Alcohol Use: No     Comment: started AA 05/2012.  Last drink early January   family history includes Cancer in her maternal grandfather and maternal grandmother; Cancer (age of onset: 83) in her mother; Colon cancer in her maternal grandmother; Heart disease in her father and paternal grandfather; Hyperlipidemia in her father; Hypertension in her father; Other in her father.  ROS as above Medications: Current Outpatient Prescriptions  Medication Sig Dispense Refill  . albuterol (PROVENTIL HFA;VENTOLIN HFA) 108 (90 Base) MCG/ACT inhaler Inhale 1-2 puffs into the lungs.    Marland Kitchen albuterol (PROVENTIL) (2.5 MG/3ML) 0.083% nebulizer solution Take 3 mLs (2.5 mg total) by nebulization every 6 (six) hours as needed for wheezing or shortness of breath. 150 mL 1  . ALPRAZolam (XANAX) 1 MG tablet Take 1 tablet (1 mg total) by  mouth 2 (two) times daily as needed for anxiety. 60 tablet 1  . AMBULATORY NON FORMULARY MEDICATION Diabetic testing strips: OneTouch Verio: Use to check blood sugar up to three times a day. 50 Units 11  . atenolol (TENORMIN) 50 MG tablet TAKE 1.5 TABLETS (75 MG TOTAL) BY MOUTH DAILY. 135 tablet 1  . benzonatate (TESSALON) 200 MG capsule Take 1 capsule (200 mg total) by mouth 3 (three) times daily as needed for cough. 30 capsule 0  . guaiFENesin-codeine (ROBITUSSIN AC) 100-10 MG/5ML syrup Take 5-10 mLs by mouth at bedtime as needed for cough.  236 mL 0  . Insulin Glargine (TOUJEO SOLOSTAR) 300 UNIT/ML SOPN Inject 14 Units into the skin daily. 3 pen 3  . Insulin Pen Needle (B-D UF III MINI PEN NEEDLES) 31G X 5 MM MISC Use to inject insulin daily. 50 each 11  . ipratropium (ATROVENT) 0.03 % nasal spray Place 2 sprays into both nostrils every 12 (twelve) hours. 30 mL 0  . levofloxacin (LEVAQUIN) 500 MG tablet Take 500 mg by mouth.    . metFORMIN (GLUCOPHAGE) 1000 MG tablet Take 0.5 tablets (500 mg total) by mouth 2 (two) times daily with a meal. One by mouth twice a day for blood sugar control. 60 tablet 3  . mometasone (NASONEX) 50 MCG/ACT nasal spray Two sprays each nostril daily. 17 g 2  . risperiDONE (RISPERDAL) 1 MG tablet Take 1 tablet (1 mg total) by mouth daily. 90 tablet 1  . venlafaxine XR (EFFEXOR-XR) 150 MG 24 hr capsule Take 1 capsule (150 mg total) by mouth daily with breakfast. 90 capsule 1  . venlafaxine XR (EFFEXOR-XR) 75 MG 24 hr capsule TAKE ONE CAPSULE BY MOUTH DAILY WITH BREAKFAST. 90 capsule 1  . ipratropium (ATROVENT) 0.02 % nebulizer solution Take 2.5 mLs (0.5 mg total) by nebulization 4 (four) times daily. 75 mL 2  . predniSONE (DELTASONE) 50 MG tablet Take 1 tablet (50 mg total) by mouth daily. 5 tablet 0   No current facility-administered medications for this visit.   Facility-Administered Medications Ordered in Other Visits  Medication Dose Route Frequency Provider Last Rate Last Dose  . iohexol (OMNIPAQUE) 300 MG/ML solution 75 mL  75 mL Intravenous Once PRN Medication Radiologist, MD       Allergies  Allergen Reactions  . Quinapril     cough  . Montelukast Cough     Exam:  BP 137/88 mmHg  Pulse 64  Temp(Src) 97.7 F (36.5 C) (Oral)  Wt 189 lb (85.73 kg)  SpO2 97% Gen: Well NAD nontoxic HEENT: EOMI,  MMM clear nasal discharge Lungs: Normal work of breathing. Slight wheezing and prolonged expiratory phase Heart: RRR no MRG Abd: NABS, Soft. Nondistended, Nontender Exts: Brisk capillary refill,  warm and well perfused.   No results found for this or any previous visit (from the past 24 hour(s)). No results found.   Please see individual assessment and plan sections.

## 2015-05-30 ENCOUNTER — Ambulatory Visit (INDEPENDENT_AMBULATORY_CARE_PROVIDER_SITE_OTHER): Payer: BLUE CROSS/BLUE SHIELD | Admitting: Family Medicine

## 2015-05-30 ENCOUNTER — Encounter: Payer: Self-pay | Admitting: Family Medicine

## 2015-05-30 ENCOUNTER — Ambulatory Visit (INDEPENDENT_AMBULATORY_CARE_PROVIDER_SITE_OTHER): Payer: BLUE CROSS/BLUE SHIELD

## 2015-05-30 VITALS — BP 124/79 | HR 64 | Temp 98.9°F | Wt 194.0 lb

## 2015-05-30 DIAGNOSIS — M5416 Radiculopathy, lumbar region: Secondary | ICD-10-CM

## 2015-05-30 DIAGNOSIS — M5136 Other intervertebral disc degeneration, lumbar region: Secondary | ICD-10-CM

## 2015-05-30 NOTE — Progress Notes (Signed)
Sara Leon is a 50 y.o. female who presents to [place]: Primary Care today for radicular back pain.  Patient developed severe low back pain with bilateral lower extremity radiating pain weakness and numbness yesterday. She was seen in the emergency department where CT scan of her lumbar spine showed bilateral neuroforaminal stenosis disc bulging and facet arthropathy at L4-L5 and L5-S1. MRI was not conducted. She was given hydrocodone and follows up today. She notes the pain and weakness have worsened. She denies any new bowel or bladder dysfunction. She does have a history of bilateral incontinence however. Any fevers or chills. Of note she was recently treated for pneumonia and bronchitis with prednisone and Levaquin.   Past Medical History  Diagnosis Date  . Anxiety   . GERD (gastroesophageal reflux disease)   . Depression   . 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 Pancreatitis chronic     on resection specimen  . Deep venous thrombosis of upper extremity ([place]) 2011    due to PICC  . Pancreatic insufficiency ([place]) 10/16/2010  . Fatty liver     appears improved on imaging after weight loss  . Diabetes mellitus 10/2008    diet controlled  . Hyperlipidemia   . Hypertension   . Condyloma acuminatum of vulva   . Osteoarthritis of hand     bilat 2nd and 3rd fingers at MCP, PIP  . Wears contact lenses   . Routine gynecological examination     Dr. Elonda Husky, [facility]  . Bladder incontinence     Urology, pending studies 05/2012   Past Surgical History  Procedure Laterality Date  . Pancreaticoduodenectomy  05/2009     pancreatic intrepithelial neoplasia types 1A and 1B (Dr. Eugenia Pancoast)  . Tonsillectomy  1971  . Cholecystectomy  2000  . Dobbhoff feeding tube  07/04/09    [facility]  . Upper gastrointestinal endoscopy  2005 and 2010 - [place],  [place]    gastritis 2005 and 2010, retained food 2005, no H. pyloi and duodenal bxs normal  . Upper endoscopic ultrasound w/ fna  05/12/2009    Uncinate process mass - Dr. Jerene Pitch  . Pancreaticoduodenectomy  06/10/09    [facility], Dr. Eugenia Pancoast  . Abdominal hysterectomy  1987    Partial due to heavy bleeding, still has her ovaries   Social History  Substance Use Topics  . Smoking status: Current Every Day Smoker -- 1.00 packs/day  . Smokeless tobacco: Never Used  . Alcohol Use: No     Comment: started AA 05/2012.  Last drink early January   family history includes Cancer in her maternal grandfather and maternal grandmother; Cancer (age of onset: 57) in her mother; Colon cancer in her maternal grandmother; Heart disease in her father and paternal grandfather; Hyperlipidemia in her father; Hypertension in her father; Other in her father.  ROS as above Medications: Current Outpatient Prescriptions  Medication Sig Dispense Refill  . albuterol (PROVENTIL HFA;VENTOLIN HFA) 108 (90 Base) MCG/ACT inhaler Inhale 1-2 puffs into the lungs.    Marland Kitchen albuterol (PROVENTIL) (2.5 MG/3ML) 0.083% nebulizer solution Take 3 mLs (2.5 mg total) by nebulization every 6 (six) hours as needed for wheezing or shortness of breath. 150 mL 1  . ALPRAZolam (XANAX) 1 MG tablet Take 1 tablet (1 mg total) by mouth 2 (two) times daily as needed for anxiety. 60 tablet 1  . AMBULATORY NON FORMULARY MEDICATION Diabetic testing strips:  OneTouch Verio: Use to check blood sugar up to three times a day. 50 Units 11  . atenolol (TENORMIN) 50 MG tablet TAKE 1.5 TABLETS (75 MG TOTAL) BY MOUTH DAILY. 135 tablet 1  . Insulin Glargine (TOUJEO SOLOSTAR) 300 UNIT/ML SOPN Inject 14 Units into the skin daily. 3 pen 3  . Insulin Pen Needle (B-D UF III MINI PEN NEEDLES) 31G X 5 MM MISC Use to inject insulin daily. 50 each 11  . ipratropium (ATROVENT) 0.02 % nebulizer solution Take 2.5 mLs (0.5 mg total) by nebulization 4 (four) times daily. 75 mL 2    . metFORMIN (GLUCOPHAGE) 1000 MG tablet Take 0.5 tablets (500 mg total) by mouth 2 (two) times daily with a meal. One by mouth twice a day for blood sugar control. 60 tablet 3  . mometasone (NASONEX) 50 MCG/ACT nasal spray Two sprays each nostril daily. 17 g 2  . venlafaxine XR (EFFEXOR-XR) 150 MG 24 hr capsule Take 1 capsule (150 mg total) by mouth daily with breakfast. 90 capsule 1  . venlafaxine XR (EFFEXOR-XR) 75 MG 24 hr capsule TAKE ONE CAPSULE BY MOUTH DAILY WITH BREAKFAST. 90 capsule 1   No current facility-administered medications for this visit.   Facility-Administered Medications Ordered in Other Visits  Medication Dose Route Frequency Provider Last Rate Last Dose  . iohexol (OMNIPAQUE) 300 MG/ML solution 75 mL  75 mL Intravenous Once PRN Medication Radiologist, MD       Allergies  Allergen Reactions  . Quinapril     cough  . Montelukast Cough     Exam:  BP 124/79 mmHg  Pulse 64  Temp(Src) 98.9 F (37.2 C) (Oral)  Wt 194 lb (87.998 kg)  SpO2 100% Gen: Well NAD in severe pain appearing HEENT: EOMI,  MMM posterior pharynx with cobblestoning Lungs: Normal work of breathing. CTABL Heart: RRR no MRG Abd: NABS, Soft. Nondistended, Nontender Exts: Brisk capillary refill, warm and well perfused.  Back is nontender. Back range of motion is significantly limited. Patient cannot flex or rotate it can extend her back. Lower extremity strength is slightly diminished. Patient has difficulty with heel standing. She can stand on her toes and squat. Sensation is intact throughout bilateral lower extremities. Antalgic gait.  CT lumbar spine 05/29/2015: IMPRESSION:  L4-5/L5-S1 degenerative disc bulge with associated neuroforaminal stenosis. This is unchanged and suggestive 15   Result Narrative  TECHNIQUE: Routine noncontrast CT lumbar spine was performed. Coronal and sagittal reformatted images were obtained and reviewed. Radiation dose reduction was utilized (automated exposure  control, mA or kV adjustment based on patient size, or iterative  image reconstruction). INDICATION: Backache NOS   COMPARISON:  03/30/14  FINDINGS: No destructive bone changes.  L4-5 and L5-S1 mild disc bulge and facet arthropathy causing bilateral neuroforaminal stenosis.  No significant central canal stenosis. This appears unchanged since prior study. No other significant bone findings      No results found for this or any previous visit (from the past 24 hour(s)). No results found.   Please see individual assessment and plan sections.

## 2015-05-30 NOTE — Assessment & Plan Note (Signed)
New onset of lumbar radiculopathy. Concerning for cauda equina syndrome. Stat lumbar MRI ordered now. Return tomorrow for review.

## 2015-05-30 NOTE — Patient Instructions (Signed)
Thank you for coming in today. Get MRI NOW.  Return tomorrow for discussion of results and plan.  Come back or go to the emergency room if you notice new weakness new numbness problems walking or bowel or bladder problems.

## 2015-05-31 ENCOUNTER — Ambulatory Visit (INDEPENDENT_AMBULATORY_CARE_PROVIDER_SITE_OTHER): Payer: BLUE CROSS/BLUE SHIELD | Admitting: Family Medicine

## 2015-05-31 ENCOUNTER — Encounter: Payer: Self-pay | Admitting: Family Medicine

## 2015-05-31 ENCOUNTER — Inpatient Hospital Stay: Payer: BLUE CROSS/BLUE SHIELD | Admitting: Sports Medicine

## 2015-05-31 ENCOUNTER — Other Ambulatory Visit: Payer: Self-pay | Admitting: Family Medicine

## 2015-05-31 ENCOUNTER — Other Ambulatory Visit: Payer: Self-pay

## 2015-05-31 VITALS — BP 128/85 | HR 71

## 2015-05-31 DIAGNOSIS — M5416 Radiculopathy, lumbar region: Secondary | ICD-10-CM

## 2015-05-31 MED ORDER — GABAPENTIN 300 MG PO CAPS: ORAL_CAPSULE | ORAL | Status: DC

## 2015-05-31 MED ORDER — HYDROCODONE-ACETAMINOPHEN 7.5-325 MG PO TABS: 1.0000 | ORAL_TABLET | Freq: Three times a day (TID) | ORAL | Status: DC | PRN

## 2015-05-31 NOTE — Progress Notes (Signed)
Quick Note:  MRI spine shows a small disc herniation but not anything that should cause paralysis. Return today for options. ______

## 2015-05-31 NOTE — Assessment & Plan Note (Signed)
Symptoms are quite severe. Patient had symptoms despite already been on oral prednisone. Refer to epidural steroid injection. Follow up after injection.  Treat pain with norco and gabapentin.

## 2015-05-31 NOTE — Patient Instructions (Signed)
Thank you for coming in today. Return following injection.  Come back or go to the emergency room if you notice new weakness new numbness problems walking or bowel or bladder problems.  Take norco for severe pain.  Take Gabapentin 3x daily.

## 2015-05-31 NOTE — Progress Notes (Signed)
Sara Leon is a 50 y.o. female who presents to [place]: Primary Care today for follow-up MRI. Patient was seen yesterday for severe radicular lumbar pain with any weakness. She had MRI of her lumbar spine which showed right disc herniation possibly impinging on the right L5 nerve root. She did not have severe herniation or cauda equina findings on MRI. She notes her symptoms are slightly improved. She denies worsening weakness or numbness or bowel bladder dysfunction. She just finished prednisone for bronchitis treatment.   Past Medical History  Diagnosis Date  . Anxiety   . GERD (gastroesophageal reflux disease)   . Depression   . 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 Pancreatitis chronic     on resection specimen  . Deep venous thrombosis of upper extremity ([place]) 2011    due to PICC  . Pancreatic insufficiency ([place]) 10/16/2010  . Fatty liver     appears improved on imaging after weight loss  . Diabetes mellitus 10/2008    diet controlled  . Hyperlipidemia   . Hypertension   . Condyloma acuminatum of vulva   . Osteoarthritis of hand     bilat 2nd and 3rd fingers at MCP, PIP  . Wears contact lenses   . Routine gynecological examination     Dr. Elonda Husky, [facility] Hoff  . Bladder incontinence     Urology, pending studies 05/2012   Past Surgical History  Procedure Laterality Date  . Pancreaticoduodenectomy  05/2009     pancreatic intrepithelial neoplasia types 1A and 1B (Dr. Eugenia Pancoast)  . Tonsillectomy  1971  . Cholecystectomy  2000  . Dobbhoff feeding tube  07/04/09    [facility]  . Upper gastrointestinal endoscopy  2005 and 2010 - [place], [place]    gastritis 2005 and 2010, retained food 2005, no H. pyloi and duodenal bxs normal  . Upper endoscopic ultrasound w/ fna  05/12/2009    Uncinate process mass - Dr. Jerene Pitch  .  Pancreaticoduodenectomy  06/10/09    [facility], Dr. Eugenia Pancoast  . Abdominal hysterectomy  1987    Partial due to heavy bleeding, still has her ovaries   Social History  Substance Use Topics  . Smoking status: Current Every Day Smoker -- 1.00 packs/day  . Smokeless tobacco: Never Used  . Alcohol Use: No     Comment: started AA 05/2012.  Last drink early January   family history includes Cancer in her maternal grandfather and maternal grandmother; Cancer (age of onset: 25) in her mother; Colon cancer in her maternal grandmother; Heart disease in her father and paternal grandfather; Hyperlipidemia in her father; Hypertension in her father; Other in her father.  ROS as above Medications: Current Outpatient Prescriptions  Medication Sig Dispense Refill  . albuterol (PROVENTIL HFA;VENTOLIN HFA) 108 (90 Base) MCG/ACT inhaler Inhale 1-2 puffs into the lungs.    Marland Kitchen albuterol (PROVENTIL) (2.5 MG/3ML) 0.083% nebulizer solution Take 3 mLs (2.5 mg total) by nebulization every 6 (six) hours as needed for wheezing or shortness of breath. 150 mL 1  . ALPRAZolam (XANAX) 1 MG tablet Take 1 tablet (1 mg total) by mouth 2 (two) times daily as needed for anxiety. 60 tablet 1  . AMBULATORY NON FORMULARY MEDICATION Diabetic testing strips: OneTouch Verio: Use to check blood sugar up to three times a day. 50 Units 11  . atenolol (TENORMIN) 50 MG tablet TAKE 1.5 TABLETS (75 MG TOTAL) BY MOUTH DAILY.  135 tablet 1  . gabapentin (NEURONTIN) 300 MG capsule One tab PO qHS for a week, then BID for a week, then TID. May double weekly to a max of 3,600mg /day 180 capsule 3  . HYDROcodone-acetaminophen (NORCO) 7.5-325 MG tablet Take 1 tablet by mouth every 8 (eight) hours as needed for moderate pain (cough). 30 tablet 0  . Insulin Glargine (TOUJEO SOLOSTAR) 300 UNIT/ML SOPN Inject 14 Units into the skin daily. 3 pen 3  . Insulin Pen Needle (B-D UF III MINI PEN NEEDLES) 31G X 5 MM MISC Use to inject insulin daily. 50 each 11  .  ipratropium (ATROVENT) 0.02 % nebulizer solution Take 2.5 mLs (0.5 mg total) by nebulization 4 (four) times daily. 75 mL 2  . metFORMIN (GLUCOPHAGE) 1000 MG tablet Take 0.5 tablets (500 mg total) by mouth 2 (two) times daily with a meal. One by mouth twice a day for blood sugar control. 60 tablet 3  . mometasone (NASONEX) 50 MCG/ACT nasal spray Two sprays each nostril daily. 17 g 2  . venlafaxine XR (EFFEXOR-XR) 150 MG 24 hr capsule Take 1 capsule (150 mg total) by mouth daily with breakfast. 90 capsule 1  . venlafaxine XR (EFFEXOR-XR) 75 MG 24 hr capsule TAKE ONE CAPSULE BY MOUTH DAILY WITH BREAKFAST. 90 capsule 1   No current facility-administered medications for this visit.   Facility-Administered Medications Ordered in Other Visits  Medication Dose Route Frequency Provider Last Rate Last Dose  . iohexol (OMNIPAQUE) 300 MG/ML solution 75 mL  75 mL Intravenous Once PRN Medication Radiologist, MD       Allergies  Allergen Reactions  . Quinapril     cough  . Montelukast Cough     Exam:  BP 128/85 mmHg  Pulse 71 Gen: Well NAD Nontender to midline. Decreased back range of motion especially with flexion. Lower extremity strength is intact throughout. Patient has antalgic gait. She can stand on her toes and heels squat climb on and off exam table. Sensation is intact throughout   No results found for this or any previous visit (from the past 24 hour(s)). Mr Lumbar Spine Wo Contrast  05/30/2015  CLINICAL DATA:  50 year old female with severe lumbar back pain radiating to both lower extremities with numbness tingling and burning. No known injury. Initial encounter. EXAM: MRI LUMBAR SPINE WITHOUT CONTRAST TECHNIQUE: Multiplanar, multisequence MR imaging of the lumbar spine was performed. No intravenous contrast was administered. COMPARISON:  CT Abdomen and Pelvis 04/22/2009. FINDINGS: Transitional lumbosacral anatomy demonstrated on the comparison with partially sacralized L5 level. Bilateral  L5-S1 assimilation joints. Hypoplastic ribs at T12 and full size ribs at T11 by this numbering system. Stable vertebral height and alignment since 2010. No marrow edema or evidence of acute osseous abnormality. Visualized lower thoracic spinal cord is normal with conus medularis at L1. Visualized abdominal viscera and paraspinal soft tissues are within normal limits. There is mild dependent subcutaneous edema. T11-T12:  Negative. T12-L1:  Negative. L1-L2:  Negative. L2-L3: Mild disc desiccation and circumferential disc bulge. Mild facet hypertrophy. No significant stenosis. L3-L4: Mild disc desiccation and circumferential disc bulge. Mild to moderate facet hypertrophy. Trace facet joint fluid. No spinal or lateral recess stenosis. Borderline to mild left L3 foraminal stenosis. L4-L5: Disc desiccation with mild disc bulge. Superimposed broad-based central disc extrusion best seen on series 2, image 7 and series 5, image 29. Chronic moderate to severe facet hypertrophy greater on the right. Disc material abutting the descending right L5 nerve roots in the lateral recess (  series 5, image 30). No spinal or convincing left lateral recess stenosis. No foraminal stenosis. L5-S1: Transitional anatomy. Small normal disc. Mildly sclerotic bilateral assimilation joints. No stenosis. IMPRESSION: 1. Transitional lumbosacral anatomy. Correlation with radiographs is recommended prior to any operative intervention. 2. Disc degeneration maximal at L4-L5 where a small disc herniation appears to most affect the descending right L5 nerve roots in the lateral recess. 3. 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 Up to mild multifactorial neural foraminal stenosis. Electronically Signed   By: Genevie Ann M.D.   On: 05/30/2015 15:13     Please see individual assessment and plan sections.

## 2015-06-03 ENCOUNTER — Other Ambulatory Visit: Payer: BLUE CROSS/BLUE SHIELD

## 2015-06-03 ENCOUNTER — Ambulatory Visit
Admission: RE | Admit: 2015-06-03 | Discharge: 2015-06-03 | Disposition: A | Discharge status: Home / Self Care | Payer: BLUE CROSS/BLUE SHIELD | Source: Ambulatory Visit | Attending: Family Medicine | Admitting: Family Medicine

## 2015-06-03 ENCOUNTER — Other Ambulatory Visit: Payer: Self-pay | Admitting: Family Medicine

## 2015-06-03 DIAGNOSIS — M5416 Radiculopathy, lumbar region: Secondary | ICD-10-CM

## 2015-06-03 MED ORDER — METHYLPREDNISOLONE ACETATE 40 MG/ML INJ SUSP (RADIOLOG
120.0000 mg | Freq: Once | INTRAMUSCULAR | Status: AC
Start: 2015-06-03 — End: 2015-06-03
  Administered 2015-06-03: 120 mg via EPIDURAL

## 2015-06-03 MED ORDER — IOHEXOL 180 MG/ML  SOLN
1.0000 mL | Freq: Once | INTRAMUSCULAR | Status: AC | PRN
Start: 2015-06-03 — End: 2015-06-03
  Administered 2015-06-03: 1 mL via EPIDURAL

## 2015-06-03 NOTE — Discharge Instructions (Signed)

## 2015-06-06 ENCOUNTER — Other Ambulatory Visit: Payer: Self-pay | Admitting: Family Medicine

## 2015-06-09 ENCOUNTER — Encounter: Payer: Self-pay | Admitting: Family Medicine

## 2015-06-09 ENCOUNTER — Ambulatory Visit (INDEPENDENT_AMBULATORY_CARE_PROVIDER_SITE_OTHER): Payer: BLUE CROSS/BLUE SHIELD | Admitting: Family Medicine

## 2015-06-09 VITALS — BP 128/86 | HR 64 | Wt 191.0 lb

## 2015-06-09 DIAGNOSIS — M5416 Radiculopathy, lumbar region: Secondary | ICD-10-CM

## 2015-06-09 DIAGNOSIS — F418 Other specified anxiety disorders: Secondary | ICD-10-CM | POA: Diagnosis not present

## 2015-06-09 MED ORDER — VENLAFAXINE HCL ER 150 MG PO CP24: 150.0000 mg | ORAL_CAPSULE | Freq: Every day | ORAL | Status: DC

## 2015-06-09 NOTE — Patient Instructions (Signed)
Thank you for coming in today. You were seen for follow up of back pain. You are doing quite well. Please come back as needed. For your upcoming drug screen, bring prescriptions with you.

## 2015-06-09 NOTE — Assessment & Plan Note (Signed)
Much improved with epidural injection. Return to work on Monday. Return to care as needed. Counseled patient tobring prescription paperwork to her drug screening.

## 2015-06-09 NOTE — Progress Notes (Signed)
Sara Leon is a 50 y.o. female who presents to [place]: Primary Care today for follow up back pain.  Patient previously seen for lumbar radiculopathy with MRI showing L5 level herniation. Patient received an epidural injection on Friday and is completely pain free. She would like to return to work Friday.   Patient expecting a job offer this week and concerned about drug screen given recent opioid for cough and back pain as well as xanax prescription. She stopped taking opioids on Sunday both cough syrup and hydrocodone.     Past Medical History  Diagnosis Date  . Anxiety   . GERD (gastroesophageal reflux disease)   . Depression   . 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 Pancreatitis chronic     on resection specimen  . Deep venous thrombosis of upper extremity ([place]) 2011    due to PICC  . Pancreatic insufficiency ([place]) 10/16/2010  . Fatty liver     appears improved on imaging after weight loss  . Diabetes mellitus 10/2008    diet controlled  . Hyperlipidemia   . Hypertension   . Condyloma acuminatum of vulva   . Osteoarthritis of hand     bilat 2nd and 3rd fingers at MCP, PIP  . Wears contact lenses   . Routine gynecological examination     Dr. Elonda Husky, [place]na Hoff  . Bladder incontinence     Urology, pending studies 05/2012   Past Surgical History  Procedure Laterality Date  . Pancreaticoduodenectomy  05/2009     pancreatic intrepithelial neoplasia types 1A and 1B (Dr. Eugenia Pancoast)  . Tonsillectomy  1971  . Cholecystectomy  2000  . Dobbhoff feeding tube  07/04/09    [facility]  . Upper gastrointestinal endoscopy  2005 and 2010 - [place], [place]    gastritis 2005 and 2010, retained food 2005, no H. pyloi and duodenal bxs normal  . Upper endoscopic ultrasound w/ fna  05/12/2009    Uncinate process mass - Dr. Jerene Pitch  .  Pancreaticoduodenectomy  06/10/09    [facility], Dr. Eugenia Pancoast  . Abdominal hysterectomy  1987    Partial due to heavy bleeding, still has her ovaries   Social History  Substance Use Topics  . Smoking status: Current Every Day Smoker -- 1.00 packs/day  . Smokeless tobacco: Never Used  . Alcohol Use: No     Comment: started AA 05/2012.  Last drink early January   family history includes Cancer in her maternal grandfather and maternal grandmother; Cancer (age of onset: 53) in her mother; Colon cancer in her maternal grandmother; Heart disease in her father and paternal grandfather; Hyperlipidemia in her father; Hypertension in her father; Other in her father.  ROS as above Medications: Current Outpatient Prescriptions  Medication Sig Dispense Refill  . albuterol (PROVENTIL) (2.5 MG/3ML) 0.083% nebulizer solution Take 3 mLs (2.5 mg total) by nebulization every 6 (six) hours as needed for wheezing or shortness of breath. 150 mL 1  . ALPRAZolam (XANAX) 1 MG tablet Take 1 tablet (1 mg total) by mouth 2 (two) times daily as needed for anxiety. 60 tablet 1  . AMBULATORY NON FORMULARY MEDICATION Diabetic testing strips: OneTouch Verio: Use to check blood sugar up to three times a day. 50 Units 11  . atenolol (TENORMIN) 50 MG tablet TAKE 1.5 TABLETS (75 MG TOTAL) BY MOUTH DAILY. 135 tablet 1  . gabapentin (NEURONTIN) 300 MG capsule One tab  PO qHS for a week, then BID for a week, then TID. May double weekly to a max of 3,600mg /day 180 capsule 3  . Insulin Glargine (TOUJEO SOLOSTAR) 300 UNIT/ML SOPN Inject 14 Units into the skin daily. 3 pen 3  . Insulin Pen Needle (B-D UF III MINI PEN NEEDLES) 31G X 5 MM MISC Use to inject insulin daily. 50 each 11  . ipratropium (ATROVENT) 0.02 % nebulizer solution Take 2.5 mLs (0.5 mg total) by nebulization 4 (four) times daily. 75 mL 2  . metFORMIN (GLUCOPHAGE) 1000 MG tablet Take 0.5 tablets (500 mg total) by mouth 2 (two) times daily with a meal. One by mouth twice  a day for blood sugar control. 60 tablet 3  . mometasone (NASONEX) 50 MCG/ACT nasal spray Two sprays each nostril daily. 17 g 2  . venlafaxine XR (EFFEXOR-XR) 150 MG 24 hr capsule Take 1 capsule (150 mg total) by mouth daily with breakfast. 90 capsule 1  . venlafaxine XR (EFFEXOR-XR) 75 MG 24 hr capsule TAKE ONE CAPSULE BY MOUTH DAILY WITH BREAKFAST. 90 capsule 1   No current facility-administered medications for this visit.   Facility-Administered Medications Ordered in Other Visits  Medication Dose Route Frequency Provider Last Rate Last Dose  . iohexol (OMNIPAQUE) 300 MG/ML solution 75 mL  75 mL Intravenous Once PRN Medication Radiologist, MD       Allergies  Allergen Reactions  . Montelukast Cough  . Quinapril Cough     Exam:  BP 128/86 mmHg  Pulse 64  Wt 191 lb (86.637 kg) Gen: Well appearing lady in NAD HEENT: EOMI,  MMM Lungs: Normal work of breathing. CTABL Heart: RRR no MRG Abd: NABS, Soft. Nondistended, Nontender Exts: Brisk capillary refill, warm and well perfused.  MSK:  No pint tenderness, palpable or visual deformity Straight leg test negative bilaterally  No results found for this or any previous visit (from the past 24 hour(s)). No results found.   Please see individual assessment and plan sections.

## 2015-07-01 ENCOUNTER — Ambulatory Visit (INDEPENDENT_AMBULATORY_CARE_PROVIDER_SITE_OTHER): Payer: BLUE CROSS/BLUE SHIELD | Admitting: Family Medicine

## 2015-07-01 VITALS — BP 146/95 | HR 65 | Wt 194.0 lb

## 2015-07-01 DIAGNOSIS — M5416 Radiculopathy, lumbar region: Secondary | ICD-10-CM | POA: Diagnosis not present

## 2015-07-01 DIAGNOSIS — I10 Essential (primary) hypertension: Secondary | ICD-10-CM

## 2015-07-01 DIAGNOSIS — Z794 Long term (current) use of insulin: Secondary | ICD-10-CM

## 2015-07-01 DIAGNOSIS — E118 Type 2 diabetes mellitus with unspecified complications: Secondary | ICD-10-CM | POA: Diagnosis not present

## 2015-07-01 DIAGNOSIS — F418 Other specified anxiety disorders: Secondary | ICD-10-CM

## 2015-07-01 MED ORDER — ATENOLOL 50 MG PO TABS: ORAL_TABLET | ORAL | Status: DC

## 2015-07-01 MED ORDER — RISPERIDONE 1 MG PO TABS: 1.0000 mg | ORAL_TABLET | Freq: Every day | ORAL | Status: DC

## 2015-07-01 MED ORDER — GABAPENTIN 300 MG PO CAPS: ORAL_CAPSULE | ORAL | Status: DC

## 2015-07-01 MED ORDER — VENLAFAXINE HCL ER 75 MG PO CP24: ORAL_CAPSULE | ORAL | Status: DC

## 2015-07-01 MED ORDER — ALPRAZOLAM 1 MG PO TABS: 1.0000 mg | ORAL_TABLET | Freq: Two times a day (BID) | ORAL | Status: DC | PRN

## 2015-07-01 MED ORDER — VENLAFAXINE HCL ER 150 MG PO CP24: 150.0000 mg | ORAL_CAPSULE | Freq: Every day | ORAL | Status: DC

## 2015-07-01 MED ORDER — INSULIN GLARGINE 300 UNIT/ML ~~LOC~~ SOPN: 14.0000 [IU] | PEN_INJECTOR | Freq: Every day | SUBCUTANEOUS | Status: DC

## 2015-07-03 ENCOUNTER — Encounter: Payer: Self-pay | Admitting: Family Medicine

## 2015-07-03 NOTE — Progress Notes (Signed)
CC: Sara Leon is a 50 y.o. female is here for Medication Refill   Subjective: HPI:  Shes moving to [employer] today and requests refills  Depression/Anxiety: Denies any depression on current effexor xr regimen.  She's excited about the move, has a great job lined up.  Still gets anxious about anything and everything but xanax helps this not interfere with quality of life.  Denies any new mental disturbance.  HTN: No outside BPs to report.  No chest pain, sob, orthopnea nor PND.  Requesting refill on gabapentin.  No current back pain or radicular symptoms provided she takes this medication daily.  No new motor or sensory disturvances.  DM2: requesting refills on metformin. No outside blood sugars to report. NO new vision disturbances.    Review Of Systems Outlined In HPI  Past Medical History  Diagnosis Date  . Anxiety   . GERD (gastroesophageal reflux disease)   . Depression   . 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 Pancreatitis chronic     on resection specimen  . Deep venous thrombosis of upper extremity ([place]) 2011    due to PICC  . Pancreatic insufficiency ([place]) 10/16/2010  . Fatty liver     appears improved on imaging after weight loss  . Diabetes mellitus 10/2008    diet controlled  . Hyperlipidemia   . Hypertension   . Condyloma acuminatum of vulva   . Osteoarthritis of hand     bilat 2nd and 3rd fingers at MCP, PIP  . Wears contact lenses   . Routine gynecological examination     Dr. Elonda Husky, [facility]  . Bladder incontinence     Urology, pending studies 05/2012    Past Surgical History  Procedure Laterality Date  . Pancreaticoduodenectomy  05/2009     pancreatic intrepithelial neoplasia types 1A and 1B (Dr. Eugenia Pancoast)  . Tonsillectomy  1971  . Cholecystectomy  2000  . Dobbhoff feeding tube  07/04/09    [facility]  . Upper gastrointestinal endoscopy  2005 and 2010 - [place], [place]    gastritis 2005 and  2010, retained food 2005, no H. pyloi and duodenal bxs normal  . Upper endoscopic ultrasound w/ fna  05/12/2009    Uncinate process mass - Dr. Jerene Pitch  . Pancreaticoduodenectomy  06/10/09    [facility], Dr. Eugenia Pancoast  . Abdominal hysterectomy  1987    Partial due to heavy bleeding, still has her ovaries   Family History  Problem Relation Age of Onset  . Crohn's disease      nephew  . Colon cancer Maternal Grandmother   . Cancer Maternal Grandmother     colon  . Cancer Mother 85    died of melanoma  . Hypertension Father   . Heart disease Father     MI  . Hyperlipidemia Father   . Other Father     died in [place]  . Cancer Maternal Grandfather     colon  . Heart disease Paternal Grandfather     Social History   Social History  . Marital Status: Married    Spouse Name: N/A  . Number of Children: 1  . Years of Education: N/A   Occupational History  . 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 Smoking status: Current Every Day Smoker -- 1.00 packs/day  . Smokeless tobacco: Never Used  . Alcohol Use: No     Comment: started AA 05/2012.  Last drink early January  . Drug Use: No  . Sexual Activity:    Partners: Male   Other Topics Concern  . 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  Exercises at the [employer]     Objective: BP 146/95 mmHg  Pulse 65  Wt 194 lb (87.998 kg)  General: Alert and Oriented, No Acute Distress HEENT: Pupils equal, round, reactive to light. Conjunctivae clear.  External ears unremarkable,moist mucous membranes Lungs: Clear to auscultation bilaterally, no wheezing/ronchi/rales.  Comfortable work of breathing. Good air movement. Cardiac: Regular rate and rhythm. Normal S1/S2.  No murmurs, rubs, nor gallops.   Extremities: No peripheral edema.  Strong peripheral pulses.  Mental Status: No depression, anxiety, nor agitation. Skin: Warm and  dry.  Assessment & Plan: Sara Leon was seen today for medication refill.  Diagnoses and all orders for this visit:  Depression with anxiety -     venlafaxine XR (EFFEXOR-XR) 150 MG 24 hr capsule; Take 1 capsule (150 mg total) by mouth daily with breakfast. -     venlafaxine XR (EFFEXOR-XR) 75 MG 24 hr capsule; TAKE ONE CAPSULE BY MOUTH DAILY WITH BREAKFAST.  Essential hypertension  Lumbar radiculopathy, acute -     gabapentin (NEURONTIN) 300 MG capsule; One tab PO qHS for a week, then BID for a week, then TID. May double weekly to a max of 3,600mg /day  Type 2 diabetes mellitus with complication, with long-term current use of insulin ([place])  Other orders -     ALPRAZolam (XANAX) 1 MG tablet; Take 1 tablet (1 mg total) by mouth 2 (two) times daily as needed for anxiety. -     atenolol (TENORMIN) 50 MG tablet; TAKE 1.5 TABLETS (75 MG TOTAL) BY MOUTH DAILY. -     Insulin Glargine (TOUJEO SOLOSTAR) 300 UNIT/ML SOPN; Inject 14 Units into the skin daily. -     risperiDONE (RISPERDAL) 1 MG tablet; Take 1 tablet (1 mg total) by mouth at bedtime.   Anxiety/Depression: controlled with effexor and xanax [id number] on atenolol Lumbar Radiculopathy: Controlled with gabapentin DM2: controlled on metformin  Return in about 3 months (around 09/28/2015).

## 2015-07-09 ENCOUNTER — Other Ambulatory Visit: Payer: Self-pay | Admitting: Family Medicine

## 2015-08-05 ENCOUNTER — Ambulatory Visit (INDEPENDENT_AMBULATORY_CARE_PROVIDER_SITE_OTHER): Payer: BLUE CROSS/BLUE SHIELD | Admitting: Family Medicine

## 2015-08-05 ENCOUNTER — Encounter: Payer: Self-pay | Admitting: Family Medicine

## 2015-08-05 VITALS — BP 136/88 | HR 58 | Temp 98.7°F | Wt 193.0 lb

## 2015-08-05 DIAGNOSIS — M5416 Radiculopathy, lumbar region: Secondary | ICD-10-CM | POA: Diagnosis not present

## 2015-08-05 DIAGNOSIS — I10 Essential (primary) hypertension: Secondary | ICD-10-CM

## 2015-08-05 DIAGNOSIS — F418 Other specified anxiety disorders: Secondary | ICD-10-CM | POA: Diagnosis not present

## 2015-08-05 DIAGNOSIS — E118 Type 2 diabetes mellitus with unspecified complications: Secondary | ICD-10-CM

## 2015-08-05 LAB — POCT GLYCOSYLATED HEMOGLOBIN (HGB A1C): HEMOGLOBIN A1C: 6

## 2015-08-05 MED ORDER — INSULIN GLARGINE 300 UNIT/ML ~~LOC~~ SOPN: 14.0000 [IU] | PEN_INJECTOR | Freq: Every day | SUBCUTANEOUS | Status: DC

## 2015-08-05 MED ORDER — ATENOLOL 50 MG PO TABS: ORAL_TABLET | ORAL | Status: DC

## 2015-08-05 MED ORDER — GABAPENTIN 300 MG PO CAPS: ORAL_CAPSULE | ORAL | Status: DC

## 2015-08-05 MED ORDER — ALPRAZOLAM 1 MG PO TABS: ORAL_TABLET | ORAL | Status: DC

## 2015-08-05 MED ORDER — METFORMIN HCL 1000 MG PO TABS: 500.0000 mg | ORAL_TABLET | Freq: Two times a day (BID) | ORAL | Status: DC

## 2015-08-05 MED ORDER — RISPERIDONE 1 MG PO TABS: 1.0000 mg | ORAL_TABLET | Freq: Every day | ORAL | Status: DC

## 2015-08-05 MED ORDER — INSULIN PEN NEEDLE 31G X 5 MM MISC: Status: DC

## 2015-08-05 MED ORDER — VENLAFAXINE HCL ER 150 MG PO CP24: 150.0000 mg | ORAL_CAPSULE | Freq: Every day | ORAL | Status: DC

## 2015-08-05 NOTE — Progress Notes (Signed)
CC: Sara Leon is a 51 y.o. female is here for Hyperglycemia   Subjective: HPI:  Beginning Wednesday morning she had sudden onset of fever, chills, abdominal discomfort and diarrhea. She was able to sleep at all for the majority of Wednesday and Thursday and felt somewhat better this morning. She has been able to find a doctor in [place] and wants know if I can provide her with a work note. She denies any nauseousness or difficulty eating or drinking at the present moment, diarrhea has resolved.  She is requesting a refill on the majority for medications.  Follow-up type 2 diabetes: She has made great improvement with cutting back on carbohydrates in her diet. She is currently sticking to lean meats and steamed vegetables. No outside blood sugars report she denies hypoglycemic episodes. No motor or sensory disturbances.  Essential hypertension hypertension: Taking Tylenol on a daily basis with no outside blood pressures to report. No chest pain  Follow-up anxiety her job is going so well that she was actually able to cut back on Effexor without causing any new anxiety. She still lies on taking 2 Xanax based out throughout the day in order to be 100% without anxiety. She denies depression   Review Of Systems Outlined In HPI  Past Medical History  Diagnosis Date  . Anxiety   . GERD (gastroesophageal reflux disease)   . Depression   . 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 Pancreatitis chronic     on resection specimen  . Deep venous thrombosis of upper extremity ([place]) 2011    due to PICC  . Pancreatic insufficiency ([place]) 10/16/2010  . Fatty liver     appears improved on imaging after weight loss  . Diabetes mellitus 10/2008    diet controlled  . Hyperlipidemia   . Hypertension   . Condyloma acuminatum of vulva   . Osteoarthritis of hand     bilat 2nd and 3rd fingers at MCP, PIP  . Wears contact lenses   . Routine gynecological  examination     Dr. Elonda Husky, [facility]  . Bladder incontinence     Urology, pending studies 05/2012    Past Surgical History  Procedure Laterality Date  . Pancreaticoduodenectomy  05/2009     pancreatic intrepithelial neoplasia types 1A and 1B (Dr. Eugenia Pancoast)  . Tonsillectomy  1971  . Cholecystectomy  2000  . Dobbhoff feeding tube  07/04/09    [facility]  . Upper gastrointestinal endoscopy  2005 and 2010 - [place], [place]    gastritis 2005 and 2010, retained food 2005, no H. pyloi and duodenal bxs normal  . Upper endoscopic ultrasound w/ fna  05/12/2009    Uncinate process mass - Dr. Jerene Pitch  . Pancreaticoduodenectomy  06/10/09    [facility] Dr. Eugenia Pancoast  . Abdominal hysterectomy  1987    Partial due to heavy bleeding, still has her ovaries   Family History  Problem Relation Age of Onset  . Crohn's disease      nephew  . Colon cancer Maternal Grandmother   . Cancer Maternal Grandmother     colon  . Cancer Mother 59    died of melanoma  . Hypertension Father   . Heart disease Father     MI  . Hyperlipidemia Father   . Other Father     died in [place]  . Cancer Maternal Grandfather     colon  . Heart disease Paternal Grandfather  Social History   Social History  . Marital Status: Married    Spouse Name: N/A  . Number of Children: 1  . Years of Education: N/A   Occupational History  . 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 Smoking status: Current Every Day Smoker -- 1.00 packs/day  . Smokeless tobacco: Never Used  . Alcohol Use: No     Comment: started AA 05/2012.  Last drink early January  . Drug Use: No  . Sexual Activity:    Partners: Male   Other Topics Concern  . 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  Exercises at the [employer]     Objective: BP 136/88 mmHg  Pulse 58  Temp(Src) 98.7 F (37.1 C) (Oral)  Wt 193 lb (87.544 kg)  General: Alert  and Oriented, No Acute Distress HEENT: Pupils equal, round, reactive to light. Conjunctivae clear.moist mucous membranes Lungs: Clear to auscultation bilaterally, no wheezing/ronchi/rales.  Comfortable work of breathing. Good air movement. Cardiac: Regular rate and rhythm. Normal S1/S2.  No murmurs, rubs, nor gallops.   Extremities: No peripheral edema.  Strong peripheral pulses.  Mental Status: No depression, anxiety, nor agitation. Skin: Warm and dry.  Assessment & Plan: Janicia was seen today for hyperglycemia.  Diagnoses and all orders for this visit:  Lumbar radiculopathy, acute -     gabapentin (NEURONTIN) 300 MG capsule; One tab PO qHS for a week, then BID for a week, then TID. May double weekly to a max of 3,600mg /day  Type 2 diabetes mellitus with complication, without long-term current use of insulin (HCC) -     Insulin Pen Needle (B-D UF III MINI PEN NEEDLES) 31G X 5 MM MISC; Use to inject insulin daily. -     metFORMIN (GLUCOPHAGE) 1000 MG tablet; Take 0.5 tablets (500 mg total) by mouth 2 (two) times daily with a meal. One by mouth twice a day for blood sugar control. -     POCT HgB A1C  Depression with anxiety -     venlafaxine XR (EFFEXOR-XR) 150 MG 24 hr capsule; Take 1 capsule (150 mg total) by mouth daily with breakfast.  Essential hypertension  Other orders -     ALPRAZolam (XANAX) 1 MG tablet; TAKE 1 TABLET BY MOUTH TWICE A DAY AS NEEDED ANXIETY -     atenolol (TENORMIN) 50 MG tablet; TAKE 1.5 TABLETS (75 MG TOTAL) BY MOUTH DAILY. -     Insulin Glargine (TOUJEO SOLOSTAR) 300 UNIT/ML SOPN; Inject 14 Units into the skin daily. -     risperiDONE (RISPERDAL) 1 MG tablet; Take 1 tablet (1 mg total) by mouth at bedtime.   Lumbar radiculopathy: Controlled with gabapentin Type 2 diabetes: A1c of 6.0, controlled with metformin and glargine   depression and anxiety: Controlled with Effexor and alprazolam Essential hypertension: Controlled with atenolol   Return in  about 3 months (around 11/05/2015).

## 2015-09-25 ENCOUNTER — Other Ambulatory Visit: Payer: Self-pay | Admitting: Family Medicine

## 2015-10-03 ENCOUNTER — Ambulatory Visit (INDEPENDENT_AMBULATORY_CARE_PROVIDER_SITE_OTHER): Payer: BLUE CROSS/BLUE SHIELD | Admitting: Family Medicine

## 2015-10-03 ENCOUNTER — Ambulatory Visit (INDEPENDENT_AMBULATORY_CARE_PROVIDER_SITE_OTHER): Payer: BLUE CROSS/BLUE SHIELD

## 2015-10-03 VITALS — BP 143/90 | HR 62 | Wt 198.0 lb

## 2015-10-03 DIAGNOSIS — M79671 Pain in right foot: Secondary | ICD-10-CM | POA: Diagnosis not present

## 2015-10-03 DIAGNOSIS — M7741 Metatarsalgia, right foot: Secondary | ICD-10-CM | POA: Diagnosis not present

## 2015-10-03 MED ORDER — NAPROXEN 500 MG PO TABS: 500.0000 mg | ORAL_TABLET | Freq: Two times a day (BID) | ORAL | Status: DC

## 2015-10-03 NOTE — Progress Notes (Signed)
   Subjective:    I'm seeing this patient as a consultation for:  Dr. Ileene Rubens  CC: Right foot pain  HPI: Patient notes a 2 month history of worsening right foot pain. Pain is predominantly located at the plantar and dorsal second MTP. Pain is worse with ambulation and better with rest. The pain started after Ms. Cabral started a new job which required lots of walking on concrete floors. 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 She feels well otherwise no fevers chills nausea vomiting or diarrhea. She's tried multiple different types of shoes which have not helped.  Past medical history, Surgical history, Family history not pertinant except as noted below, Social history, Allergies, and medications have been entered into the medical record, reviewed, and no changes needed.   Review of Systems: No headache, visual changes, nausea, vomiting, diarrhea, constipation, dizziness, abdominal pain, skin rash, fevers, chills, night sweats, weight loss, swollen lymph nodes, body aches, joint swelling, muscle aches, chest pain, shortness of breath, mood changes, visual or auditory hallucinations.   Objective:    Filed Vitals:   10/03/15 1244  BP: 143/90  Pulse: 62    General: Well Developed, well nourished, and in no acute distress.  Neuro/Psych: Alert and oriented x3, extra-ocular muscles intact, able to move all 4 extremities, sensation grossly intact. Skin: Warm and dry, no rashes noted.  Respiratory: Not using accessory muscles, speaking in full sentences, trachea midline.  Cardiovascular: Pulses palpable, no extremity edema. Abdomen: Does not appear distended. MSK: Right foot bunion formation slightly swollen over the second and third MTPs. Exquisitely tender to palpation plantar second MTP. Callus formed over second and third metatarsal heads plantar.  X-ray right foot: Loose body versus avulsion flack visible at the second MTP. Foot is otherwise  normal appearing. Awaiting formal radiology review.  No results found for this or any previous visit (from the past 24 hour(s)). Dg Foot Complete Right  10/03/2015  CLINICAL DATA:  Pain, swelling for 2 months from right second toe along top of foot. EXAM: RIGHT FOOT COMPLETE - 3+ VIEW COMPARISON:  None. FINDINGS: Mild degenerative changes and hallux valgus at the first MTP joint. Plantar calcaneal spur. No acute bony abnormality. Specifically, no fracture, subluxation, or dislocation. Soft tissues are intact. IMPRESSION: No acute bony abnormality. Electronically Signed   By: Rolm Baptise M.D.   On: 10/03/2015 12:31    Impression and Recommendations:   50 year old woman with right foot pain: Possibly metatarsalgia versus Freiberg's AVN, versus stress fracture. Plan for metatarsal pads and a little bit of watchful waiting with NSAIDs and omeprazole for GI prophylaxis. If not improving will call and we will arrange an MRI.  This case required medical decision making of moderate complexity.

## 2015-10-03 NOTE — Patient Instructions (Signed)
Thank you for coming in today. Take naproxen for pain.  Use the metatarsal pad.  Take over the counter prilosec with naproxen to protect your stomach.  Call later this week if not better for MRI.

## 2015-10-04 ENCOUNTER — Telehealth: Payer: Self-pay

## 2015-10-04 DIAGNOSIS — M79671 Pain in right foot: Secondary | ICD-10-CM

## 2015-10-04 NOTE — Telephone Encounter (Signed)
Will order MRI.  Follow up next week after MRI ([place]ikely [place]).

## 2015-10-04 NOTE — Progress Notes (Signed)
Quick Note:  Xray normal. ______ 

## 2015-10-04 NOTE — Telephone Encounter (Signed)
Pt wants to know if you want her to stay off work until after the MRI?

## 2015-10-05 ENCOUNTER — Ambulatory Visit: Payer: BLUE CROSS/BLUE SHIELD | Admitting: Family Medicine

## 2015-10-05 ENCOUNTER — Encounter: Payer: Self-pay | Admitting: Family Medicine

## 2015-10-05 NOTE — Telephone Encounter (Signed)
I have written a work note.

## 2015-10-05 NOTE — Telephone Encounter (Signed)
Left message that Dr. Georgina Snell has a work note for her to be out until 6/1, and to advise as if it needs to be faxed to employer, or if she will pick it up.

## 2015-10-11 ENCOUNTER — Telehealth: Payer: Self-pay | Admitting: Family Medicine

## 2015-10-11 ENCOUNTER — Ambulatory Visit (INDEPENDENT_AMBULATORY_CARE_PROVIDER_SITE_OTHER): Payer: BLUE CROSS/BLUE SHIELD

## 2015-10-11 DIAGNOSIS — M25571 Pain in right ankle and joints of right foot: Secondary | ICD-10-CM | POA: Diagnosis not present

## 2015-10-11 MED ORDER — INSULIN DEGLUDEC 100 UNIT/ML ~~LOC~~ SOPN: 14.0000 [IU] | PEN_INJECTOR | Freq: Every day | SUBCUTANEOUS | Status: DC

## 2015-10-11 NOTE — Addendum Note (Signed)
Addended by: Marcial Pacas on: 10/11/2015 04:38 PM   Modules accepted: Orders, Medications

## 2015-10-11 NOTE — Telephone Encounter (Signed)
Pt called clinic today stating she went to pick up her insulin and the Rx was over $400. Pt would like PCP to change her to something cheaper. Pt reports she still has BCBS so unsure why price increased. Will route.

## 2015-10-11 NOTE — Telephone Encounter (Signed)
Quick Note:  No stress fractures are seen on the MRI. This is all joint inflammation. If no better with rest we can do an injection. ______

## 2015-10-11 NOTE — Telephone Encounter (Signed)
Insurance says the following insulins are preferred:  Engineer, agricultural, Levemir, [place].

## 2015-10-11 NOTE — Telephone Encounter (Signed)
Will you please let patient know that out of those options Tyler Aas is the best.  I've printed off a new Rx for this to replace Toujeo. In order to get the lowest price on this medication I'd recommend she go to [email or web address] and download one of the savings cards before going to the pharmacy.

## 2015-10-11 NOTE — Telephone Encounter (Signed)
Can you ask her to do me a favor and ask her insurance company which brands of long acting insulin are on her preferred formulary.  Once I have this list I can make a change to something more affordable.

## 2015-10-11 NOTE — Telephone Encounter (Signed)
Left vm with instructions for pt to call insurance to get the formulary list for long active insulin.

## 2015-10-11 NOTE — Telephone Encounter (Signed)
Called and spoke with Pharmacy ([facility] in [place]), Rx requires PA. Information has been faxed to office for completion. Pt advised.

## 2015-10-12 NOTE — Telephone Encounter (Signed)
Rx faxed and pt notified

## 2015-10-13 ENCOUNTER — Encounter: Payer: Self-pay | Admitting: Family Medicine

## 2015-10-13 ENCOUNTER — Ambulatory Visit (INDEPENDENT_AMBULATORY_CARE_PROVIDER_SITE_OTHER): Payer: BLUE CROSS/BLUE SHIELD | Admitting: Family Medicine

## 2015-10-13 ENCOUNTER — Telehealth: Payer: Self-pay

## 2015-10-13 VITALS — BP 151/90 | HR 67 | Wt 199.0 lb

## 2015-10-13 DIAGNOSIS — M19079 Primary osteoarthritis, unspecified ankle and foot: Secondary | ICD-10-CM | POA: Insufficient documentation

## 2015-10-13 DIAGNOSIS — M19071 Primary osteoarthritis, right ankle and foot: Secondary | ICD-10-CM

## 2015-10-13 MED ORDER — HYDROCODONE-ACETAMINOPHEN 5-325 MG PO TABS: 1.0000 | ORAL_TABLET | Freq: Four times a day (QID) | ORAL | Status: DC | PRN

## 2015-10-13 NOTE — Progress Notes (Signed)
Sara Leon is a 50 y.o. female who presents to [place]: [place] today for follow-up foot pain. Patient's been seen several time now for pain at the second MTP. She recently had an MRI that showed degenerative changes at the second MTP but no stress fracture or signs of infection. She notes the pain is intolerable and interfering with her ability to work. She cannot walk comfortably at all.. And is severe.   Past Medical History  Diagnosis Date  . Anxiety   . GERD (gastroesophageal reflux disease)   . Depression   . 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 Pancreatitis chronic     on resection specimen  . Deep venous thrombosis of upper extremity ([place]) 2011    due to PICC  . Pancreatic insufficiency ([place]) 10/16/2010  . Fatty liver     appears improved on imaging after weight loss  . Diabetes mellitus 10/2008    diet controlled  . Hyperlipidemia   . Hypertension   . Condyloma acuminatum of vulva   . Osteoarthritis of hand     bilat 2nd and 3rd fingers at MCP, PIP  . Wears contact lenses   . Routine gynecological examination     Dr. Elonda Husky, [facility]  . Bladder incontinence     Urology, pending studies 05/2012   Past Surgical History  Procedure Laterality Date  . Pancreaticoduodenectomy  05/2009     pancreatic intrepithelial neoplasia types 1A and 1B (Dr. Eugenia Pancoast)  . Tonsillectomy  1971  . Cholecystectomy  2000  . Dobbhoff feeding tube  07/04/09    [facility]  . Upper gastrointestinal endoscopy  2005 and 2010 - [place], [place]    gastritis 2005 and 2010, retained food 2005, no H. pyloi and duodenal bxs normal  . Upper endoscopic ultrasound w/ fna  05/12/2009    Uncinate process mass - Dr. Jerene Pitch  . Pancreaticoduodenectomy  06/10/09    [facility], Dr. Eugenia Pancoast  . Abdominal hysterectomy  1987    Partial due to heavy  bleeding, still has her ovaries   Social History  Substance Use Topics  . Smoking status: Current Every Day Smoker -- 1.00 packs/day  . Smokeless tobacco: Never Used  . Alcohol Use: No     Comment: started AA 05/2012.  Last drink early January   family history includes Cancer in her maternal grandfather and maternal grandmother; Cancer (age of onset: 57) in her mother; Colon cancer in her maternal grandmother; Heart disease in her father and paternal grandfather; Hyperlipidemia in her father; Hypertension in her father; Other in her father.  ROS as above:  Medications: Current Outpatient Prescriptions  Medication Sig Dispense Refill  . ALPRAZolam (XANAX) 1 MG tablet TAKE 1 TABLET BY MOUTH TWICE A DAY AS NEEDED ANXIETY 60 tablet 1  . AMBULATORY NON FORMULARY MEDICATION Diabetic testing strips: OneTouch Verio: Use to check blood sugar up to three times a day. 50 Units 11  . atenolol (TENORMIN) 50 MG tablet TAKE 1.5 TABLETS (75 MG TOTAL) BY MOUTH DAILY. 135 tablet 1  . gabapentin (NEURONTIN) 300 MG capsule One tab PO qHS for a week, then BID for a week, then TID. May double weekly to a max of 3,600mg /day 180 capsule 3  . Insulin Degludec (TRESIBA FLEXTOUCH) 100 UNIT/ML SOPN Inject 14 Units into the skin daily. 3 pen 11  . Insulin Pen Needle (B-D UF III MINI PEN NEEDLES)  31G X 5 MM MISC Use to inject insulin daily. 50 each 11  . metFORMIN (GLUCOPHAGE) 1000 MG tablet Take 0.5 tablets (500 mg total) by mouth 2 (two) times daily with a meal. One by mouth twice a day for blood sugar control. 60 tablet 3  . mometasone (NASONEX) 50 MCG/ACT nasal spray Two sprays each nostril daily. 17 g 2  . naproxen (NAPROSYN) 500 MG tablet Take 1 tablet (500 mg total) by mouth 2 (two) times daily with a meal. 60 tablet 0  . risperiDONE (RISPERDAL) 1 MG tablet Take 1 tablet (1 mg total) by mouth at bedtime. 90 tablet 1  . venlafaxine XR (EFFEXOR-XR) 150 MG 24 hr capsule Take 1 capsule (150 mg total) by mouth daily  with breakfast. 90 capsule 1   No current facility-administered medications for this visit.   Allergies  Allergen Reactions  . Montelukast Cough  . Quinapril Cough     Exam:  BP 151/90 mmHg  Pulse 67  Wt 199 lb (90.266 kg) Gen: Well NAD Right foot: Normal-appearing. Very tender second MTP. Pain with motion of the toe. No erythema. Pulses capillary refill sensation intact.  Procedure: Real-time Ultrasound Guided Injection of right 2nd MTP  Device: GE Logiq E  Images permanently stored and available for review in the ultrasound unit. Verbal informed consent obtained. Discussed risks and benefits of procedure. Warned about infection bleeding damage to structures skin hypopigmentation and fat atrophy among others. Patient expresses understanding and agreement Time-out conducted.  Noted no overlying erythema, induration, or other signs of local infection.  Skin prepped in a sterile fashion.  Local anesthesia: Topical Ethyl chloride.  With sterile technique and under real time ultrasound guidance: 0.10ml marcaine and 5mg  dexamthasone injected easily.  Completed without difficulty  Pain immediately resolved suggesting accurate placement of the medication.  Advised to call if fevers/chills, erythema, induration, drainage, or persistent bleeding.  Images permanently stored and available for review in the ultrasound unit.  Impression: Technically successful ultrasound guided injection.  MRI right foot dated 10/11/15: CLINICAL DATA: Increasing pain in the second and third toes after dropping a table on foot 1 year ago. No recent injury prior relevant surgery.  EXAM: MRI OF THE RIGHT FOREFOOT WITHOUT CONTRAST  TECHNIQUE: Multiplanar, multisequence MR imaging was performed. No intravenous contrast was administered.  COMPARISON: Radiographs 10/03/2015.  FINDINGS: Study includes the midfoot and forefoot.  Bones: No evidence of acute fracture or dislocation. The  alignment is normal at the Lisfranc joint. Multifocal arthropathic changes are described below.  Joint/cartilage: There are moderately advanced degenerative changes at the first metatarsal phalangeal joint associated with a mild hallux valgus deformity. There are prominent subchondral cysts within the first metatarsal head. The tibial sesamoid is bipartite with associated marrow edema. Degenerative changes are also present at the second metatarsal phalangeal joint with subchondral cyst formation and slight flattening of the second metatarsal head. In correlation with the radiographic appearance, these findings are favored to be degenerative, without definite underlying Freiberg infraction. Degenerative changes are also present in the midfoot with subchondral cysts and intraosseous ganglia in the metatarsal bases and cuneiform bones.  Ligaments: The Lisfranc ligament is intact.  Tendons/muscles: The forefoot muscles and tendons appear unremarkable.  Neurovascular/other soft tissues: Mild dorsal subcutaneous edema.  IMPRESSION: 1. Prominent degenerative changes for age involving the first and second metatarsal phalangeal joints and the Lisfranc joint. 2. No acute osseous findings. 3. The forefoot muscles and tendons appear unremarkable.   Electronically Signed  By: Caryl Comes.D.  On: 10/11/2015 09:17  No results found for this or any previous visit (from the past 24 hour(s)). No results found.    Assessment and Plan: 50 y.o. 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 Her medical improvement following injection. Hopefully this will be enough to control her pain. Also recommend steel turf toe insoles. Recheck in 2 weeks. If not better with the above plan Will refer to podiatry for consideration of joint immobilization.  Discussed warning signs or symptoms. Please see discharge instructions. Patient expresses understanding.

## 2015-10-13 NOTE — Telephone Encounter (Signed)
Will rx norco

## 2015-10-13 NOTE — Telephone Encounter (Signed)
Sara Leon called and states her foot is hurting and would like something for the pain. Please advise.

## 2015-10-13 NOTE — Patient Instructions (Signed)
Thank you for coming in today. Call or go to the ER if you develop a large red swollen joint with extreme pain or oozing puss.  Return in 2 weeks.   Get a Steel Turf Toe insole.  Do a Producer, television/film/video for [employer]

## 2015-10-14 NOTE — Telephone Encounter (Signed)
Patient advised the prescription is up front for her.

## 2015-10-18 ENCOUNTER — Ambulatory Visit (INDEPENDENT_AMBULATORY_CARE_PROVIDER_SITE_OTHER): Payer: BLUE CROSS/BLUE SHIELD | Admitting: Family Medicine

## 2015-10-18 ENCOUNTER — Encounter: Payer: Self-pay | Admitting: Family Medicine

## 2015-10-18 VITALS — BP 149/91 | HR 65 | Wt 197.0 lb

## 2015-10-18 DIAGNOSIS — F418 Other specified anxiety disorders: Secondary | ICD-10-CM

## 2015-10-18 MED ORDER — ATENOLOL 50 MG PO TABS: ORAL_TABLET | ORAL | Status: DC

## 2015-10-18 MED ORDER — ALPRAZOLAM 1 MG PO TABS: ORAL_TABLET | ORAL | Status: DC

## 2015-10-18 MED ORDER — ARIPIPRAZOLE 10 MG PO TABS: 10.0000 mg | ORAL_TABLET | Freq: Every day | ORAL | Status: DC

## 2015-10-18 MED ORDER — VENLAFAXINE HCL ER 75 MG PO CP24: 75.0000 mg | ORAL_CAPSULE | Freq: Every day | ORAL | Status: DC

## 2015-10-18 NOTE — Progress Notes (Signed)
CC: Sara Leon is a 50 y.o. female is here for Depression   Subjective: HPI:  Anxiety and depression have been worsening over the past month. She believes it is due to her job responsibilities being misleading with a drop was proposed to her. 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 Financially the plan is in the resident and has been for years. She believes that she doesn't have the support of her management to help implement new interventions to help with productivity and loss of revenue. She's been unable to find a different position a different company. She Thinks about the Problems at Her Job and It's Interfering with Her Ability to [employer]. Additionally Her Daughter Was Found to Have a something of liver disease and this is weighing on her mind as well. She began taking an additional 75 mg of Effexor 4 days ago but it's not helping at all. She said her anxiety is getting to the best of her as well. She is experiencing fidgetiness and excessive worry. No deltoid harm self or others.   Review Of Systems Outlined In HPI  Past Medical History  Diagnosis Date  . Anxiety   . GERD (gastroesophageal reflux disease)   . Depression   . 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 Pancreatitis chronic     on resection specimen  . Deep venous thrombosis of upper extremity ([place]) 2011    due to PICC  . Pancreatic insufficiency ([place]) 10/16/2010  . Fatty liver     appears improved on imaging after weight loss  . Diabetes mellitus 10/2008    diet controlled  . Hyperlipidemia   . Hypertension   . Condyloma acuminatum of vulva   . Osteoarthritis of hand     bilat 2nd and 3rd fingers at MCP, PIP  . Wears contact lenses   . Routine gynecological examination     Dr. Elonda Husky, [facility]  . Bladder incontinence     Urology, pending studies 05/2012     Past Surgical History  Procedure Laterality Date  . Pancreaticoduodenectomy  05/2009     pancreatic intrepithelial neoplasia types 1A and 1B (Dr. Eugenia Pancoast)  . Tonsillectomy  1971  . Cholecystectomy  2000  . Dobbhoff feeding tube  07/04/09    [facility]  . Upper gastrointestinal endoscopy  2005 and 2010 - [place], [place]    gastritis 2005 and 2010, retained food 2005, no H. pyloi and duodenal bxs normal  . Upper endoscopic ultrasound w/ fna  05/12/2009    Uncinate process mass - Dr. Jerene Pitch  . Pancreaticoduodenectomy  06/10/09    [facility], Dr. Eugenia Pancoast  . Abdominal hysterectomy  1987    Partial due to heavy bleeding, still has her ovaries   Family History  Problem Relation Age of Onset  . Crohn's disease      nephew  . Colon cancer Maternal Grandmother   . Cancer Maternal Grandmother     colon  . Cancer Mother 48    died of melanoma  . Hypertension Father   . Heart disease Father     MI  . Hyperlipidemia Father   . Other Father     died in [place]  . Cancer Maternal Grandfather     colon  . Heart disease Paternal Grandfather     Social History   Social History  .  Marital Status: Married    Spouse Name: N/A  . Number of Children: 1  . Years of Education: N/A   Occupational History  . 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 Smoking status: Current Every Day Smoker -- 1.00 packs/day  . Smokeless tobacco: Never Used  . Alcohol Use: No     Comment: started AA 05/2012.  Last drink early January  . Drug Use: No  . Sexual Activity:    Partners: Male   Other Topics Concern  . 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  Exercises at the [employer]     Objective: BP 149/91 mmHg  Pulse 65  Wt 197 lb (89.359 kg)  Vital signs reviewed. General: Alert and Oriented, No Acute Distress HEENT: Pupils equal, round, reactive to light. Conjunctivae clear.  External  ears unremarkable.  Moist mucous membranes. Lungs: Clear and comfortable work of breathing, speaking in full sentences without accessory muscle use. Cardiac: Regular rate and rhythm.  Neuro: CN II-XII grossly intact, gait normal. Extremities: No peripheral edema.  Strong peripheral pulses.  Mental Status: Mildly depressed and moderately anxious. No agitation. Logical thought process. Skin: Warm and dry. Assessment & Plan: Sara Leon was seen today for depression.  Diagnoses and all orders for this visit:  Depression with anxiety  Other orders -     ALPRAZolam (XANAX) 1 MG tablet; TAKE 1 TABLET BY MOUTH TWICE A DAY AS NEEDED ANXIETY -     atenolol (TENORMIN) 50 MG tablet; TAKE 1.5 TABLETS (75 MG TOTAL) BY MOUTH DAILY. -     venlafaxine XR (EFFEXOR XR) 75 MG 24 hr capsule; Take 1 capsule (75 mg total) by mouth daily with breakfast. -     ARIPiprazole (ABILIFY) 10 MG tablet; Take 1 tablet (10 mg total) by mouth daily.   Uncontrolled anxiety and depression. Continue current dose of Effexor, starting Abilify as well. Call in one week if not improving next step would be increasing Effexor to 300 mg daily.  25 minutes spent face-to-face during visit today of which at least 50% was counseling or coordinating care regarding: 1. Depression with anxiety      Return in about 4 weeks (around 11/15/2015) for mood.

## 2015-10-25 ENCOUNTER — Telehealth: Payer: Self-pay

## 2015-10-25 NOTE — Telephone Encounter (Signed)
Pt was recently put on Abilify.  She reports she does not see a difference in her mood and can't sleep at night. Please advise.

## 2015-10-25 NOTE — Telephone Encounter (Signed)
Dr. Ileene Rubens placed in his note next step was to increase effexor to 300mg  daily. Lets do that and route to provider for further instructions he will be back on Thursday.   Please send over effexor XR 300mg  daily #30 nRF.

## 2015-10-26 ENCOUNTER — Other Ambulatory Visit: Payer: Self-pay

## 2015-10-26 DIAGNOSIS — F418 Other specified anxiety disorders: Secondary | ICD-10-CM

## 2015-10-26 MED ORDER — VENLAFAXINE HCL ER 150 MG PO CP24: 300.0000 mg | ORAL_CAPSULE | Freq: Every day | ORAL | Status: DC

## 2015-10-26 NOTE — Telephone Encounter (Signed)
New rx sent to [facility] pharmacy

## 2015-10-27 ENCOUNTER — Ambulatory Visit (INDEPENDENT_AMBULATORY_CARE_PROVIDER_SITE_OTHER): Payer: BLUE CROSS/BLUE SHIELD | Admitting: Family Medicine

## 2015-10-27 DIAGNOSIS — Z5329 Procedure and treatment not carried out because of patient's decision for other reasons: Secondary | ICD-10-CM

## 2015-10-27 NOTE — Progress Notes (Signed)
No show. F/u Soon

## 2015-11-01 ENCOUNTER — Other Ambulatory Visit: Payer: Self-pay

## 2015-11-01 DIAGNOSIS — E118 Type 2 diabetes mellitus with unspecified complications: Secondary | ICD-10-CM

## 2015-11-01 MED ORDER — METFORMIN HCL 1000 MG PO TABS: 500.0000 mg | ORAL_TABLET | Freq: Two times a day (BID) | ORAL | Status: DC

## 2015-11-02 ENCOUNTER — Other Ambulatory Visit: Payer: Self-pay

## 2015-11-02 DIAGNOSIS — E118 Type 2 diabetes mellitus with unspecified complications: Secondary | ICD-10-CM

## 2015-11-02 MED ORDER — METFORMIN HCL 1000 MG PO TABS: 500.0000 mg | ORAL_TABLET | Freq: Two times a day (BID) | ORAL | Status: DC

## 2015-11-16 ENCOUNTER — Ambulatory Visit: Payer: BLUE CROSS/BLUE SHIELD | Admitting: Family Medicine

## 2016-01-09 ENCOUNTER — Other Ambulatory Visit: Payer: Self-pay

## 2016-01-09 MED ORDER — ALPRAZOLAM 1 MG PO TABS: 1.0000 mg | ORAL_TABLET | Freq: Two times a day (BID) | ORAL | 0 refills | Status: DC | PRN

## 2016-01-09 MED ORDER — ARIPIPRAZOLE 10 MG PO TABS: 10.0000 mg | ORAL_TABLET | Freq: Every day | ORAL | 0 refills | Status: DC

## 2016-01-24 ENCOUNTER — Other Ambulatory Visit: Payer: Self-pay

## 2016-01-24 DIAGNOSIS — F418 Other specified anxiety disorders: Secondary | ICD-10-CM

## 2016-01-24 MED ORDER — VENLAFAXINE HCL ER 150 MG PO CP24: 300.0000 mg | ORAL_CAPSULE | Freq: Every day | ORAL | 0 refills | Status: DC

## 2016-02-06 ENCOUNTER — Other Ambulatory Visit: Payer: Self-pay | Admitting: Family Medicine

## 2016-02-22 ENCOUNTER — Other Ambulatory Visit: Payer: Self-pay | Admitting: Family Medicine

## 2016-02-22 DIAGNOSIS — F418 Other specified anxiety disorders: Secondary | ICD-10-CM

## 2016-02-23 ENCOUNTER — Ambulatory Visit (INDEPENDENT_AMBULATORY_CARE_PROVIDER_SITE_OTHER): Payer: BLUE CROSS/BLUE SHIELD | Admitting: Medical

## 2016-02-23 ENCOUNTER — Telehealth: Payer: Self-pay

## 2016-02-23 ENCOUNTER — Encounter: Payer: Self-pay | Admitting: Medical

## 2016-02-23 VITALS — BP 114/82 | HR 97 | Ht 65.0 in | Wt 189.5 lb

## 2016-02-23 DIAGNOSIS — E118 Type 2 diabetes mellitus with unspecified complications: Secondary | ICD-10-CM | POA: Diagnosis not present

## 2016-02-23 DIAGNOSIS — I10 Essential (primary) hypertension: Secondary | ICD-10-CM | POA: Diagnosis not present

## 2016-02-23 DIAGNOSIS — M7741 Metatarsalgia, right foot: Secondary | ICD-10-CM | POA: Diagnosis not present

## 2016-02-23 DIAGNOSIS — Z23 Encounter for immunization: Secondary | ICD-10-CM

## 2016-02-23 DIAGNOSIS — E785 Hyperlipidemia, unspecified: Secondary | ICD-10-CM

## 2016-02-23 DIAGNOSIS — Z79899 Other long term (current) drug therapy: Secondary | ICD-10-CM | POA: Insufficient documentation

## 2016-02-23 DIAGNOSIS — M79671 Pain in right foot: Secondary | ICD-10-CM

## 2016-02-23 DIAGNOSIS — F418 Other specified anxiety disorders: Secondary | ICD-10-CM

## 2016-02-23 DIAGNOSIS — K861 Other chronic pancreatitis: Secondary | ICD-10-CM | POA: Diagnosis not present

## 2016-02-23 DIAGNOSIS — M5416 Radiculopathy, lumbar region: Secondary | ICD-10-CM

## 2016-02-23 DIAGNOSIS — Z794 Long term (current) use of insulin: Secondary | ICD-10-CM

## 2016-02-23 LAB — COMPREHENSIVE METABOLIC PANEL
ALK PHOS: 99 U/L (ref 33–130)
ALT: 21 U/L (ref 6–29)
AST: 19 U/L (ref 10–35)
Albumin: 4.1 g/dL (ref 3.6–5.1)
BILIRUBIN TOTAL: 0.8 mg/dL (ref 0.2–1.2)
BUN: 9 mg/dL (ref 7–25)
CO2: 22 mmol/L (ref 20–31)
CREATININE: 0.62 mg/dL (ref 0.50–1.05)
Calcium: 9 mg/dL (ref 8.6–10.4)
Chloride: 105 mmol/L (ref 98–110)
GLUCOSE: 92 mg/dL (ref 65–99)
Potassium: 4.3 mmol/L (ref 3.5–5.3)
SODIUM: 137 mmol/L (ref 135–146)
Total Protein: 7 g/dL (ref 6.1–8.1)

## 2016-02-23 LAB — CBC
HCT: 41.7 % (ref 35.0–45.0)
Hemoglobin: 14 g/dL (ref 11.7–15.5)
MCH: 30.3 pg (ref 27.0–33.0)
MCHC: 33.6 g/dL (ref 32.0–36.0)
MCV: 90.3 fL (ref 80.0–100.0)
MPV: 10 fL (ref 7.5–12.5)
PLATELETS: 308 10*3/uL (ref 140–400)
RBC: 4.62 MIL/uL (ref 3.80–5.10)
RDW: 13.6 % (ref 11.0–15.0)
WBC: 9.3 10*3/uL (ref 4.0–10.5)

## 2016-02-23 LAB — LIPID PANEL
CHOLESTEROL: 93 mg/dL — AB (ref 125–200)
HDL: 35 mg/dL — ABNORMAL LOW (ref >=46)
LDL Cholesterol: 33 mg/dL (ref <130)
Total CHOL/HDL Ratio: 2.7 Ratio (ref <=5.0)
Triglycerides: 126 mg/dL (ref <150)
VLDL: 25 mg/dL (ref <30)

## 2016-02-23 NOTE — Progress Notes (Signed)
Subjective: Chief Complaint  Patient presents with  . Establish Care   Here to re-establish care.  Last visit here 2014.   Medical team: No other doctors other than the doctor she was seeing in [place].  Can't recall name of doctor she was seeing in [place].  Quit job in [place] after 9 years, moved to [place].  Had to give up that job due pain with walking.   Is still living in [place].  Was seeing doctor in [place] most recently.   Last doctor was trying to get her in with psychiatry in [place], but couldn't get appt time worked out.  Not seeing a counselor.  Working back at the job she had prior in [place].   Living in [place], [place].   Husband getting ready to retire, family is in [place].  Has a metatarsal bone broke in her foot which limits her walking.  Diabetes - taking 18 units QHS long acting Tresiba.   Was on different insurance but had to change insulins.  Has more trouble keeping sugars under control with this medication.   Diet could be better. Walks some for exercise.  Checks sugars daily.   Since last visit here she notes being diagnosed with 2 ruptured discs and PTSD.  Had [place] 05/2015 after coughing spell flared up her sciatic nerve.  Hasn't had a lot of problems since then.  No recent issues.  Mood, hx/o anxiety - doing on with sleep and mood for now.     Wants flu shot today.   Last labs - maybe March or April of this year.   Last HgbA1C was 7.8% she thinks.   Past Medical History:  Diagnosis Date  . Anxiety   . Bladder incontinence    Urology, pending studies 05/2012  . Condyloma acuminatum of vulva   . Deep venous thrombosis of upper extremity ([place]) 2011   due to PICC  . Depression   . Diabetes mellitus 10/2008   diet controlled  . Fatty liver    appears improved on imaging after weight loss  . GERD (gastroesophageal reflux disease)   . Hyperlipidemia   . Hypertension   . Osteoarthritis of hand    bilat 2nd and 3rd fingers at MCP, PIP  .  Pancreatic insufficiency 10/16/2010  . 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 Pancreatitis chronic    on resection specimen  . Routine gynecological examination    Dr. Elonda Husky, [facility] Hoff  . Wears contact lenses    Current Outpatient Prescriptions on File Prior to Visit  Medication Sig Dispense Refill  . ALPRAZolam (XANAX) 1 MG tablet Take 1 tablet (1 mg total) by mouth 2 (two) times daily as needed for anxiety. NEED FOLLOW UP APPOINTMENT FOR MORE REFILLS 60 tablet 0  . AMBULATORY NON FORMULARY MEDICATION Diabetic testing strips: OneTouch Verio: Use to check blood sugar up to three times a day. 50 Units 11  . ARIPiprazole (ABILIFY) 10 MG tablet Take 1 tablet (10 mg total) by mouth daily. NEED FOLLOW UP APPOINTMENT FOR MORE REFILLS 30 tablet 0  . atenolol (TENORMIN) 50 MG tablet TAKE 1.5 TABLETS (75 MG TOTAL) BY MOUTH DAILY. 135 tablet 1  . Insulin Degludec (TRESIBA FLEXTOUCH) 100 UNIT/ML SOPN Inject 14 Units into the skin daily. 3 pen 11  . Insulin Pen Needle (B-D UF III MINI PEN NEEDLES) 31G X 5 MM MISC Use to inject insulin daily. 50 each 11  . metFORMIN (GLUCOPHAGE) 1000 MG tablet Take 0.5  tablets (500 mg total) by mouth 2 (two) times daily with a meal. One by mouth twice a day for blood sugar control. 90 tablet 0  . risperiDONE (RISPERDAL) 1 MG tablet Take 1 tablet (1 mg total) by mouth at bedtime. NEED FOLLOW UP VISIT FOR MORE REFILLS 30 tablet 0  . venlafaxine XR (EFFEXOR-XR) 150 MG 24 hr capsule Take 2 capsules (300 mg total) by mouth daily with breakfast. NEED FOLLOW UP VISIT FOR MORE REFILLS 30 capsule 0   No current facility-administered medications on file prior to visit.    ROS as in subjective   Objective: BP 114/82   Pulse 97   Ht 5\' 5"  (1.651 m)   Wt 189 lb 8 oz (86 kg)   SpO2 (!) 55%   BMI 31.53 kg/m   Wt Readings from Last 3 Encounters:  02/23/16 189 lb 8 oz (86 kg)  10/18/15 197 lb (89.4 kg)  10/13/15 199 lb (90.3 kg)    BP Readings from Last 3 Encounters:  02/23/16 114/82  10/18/15 (!) 149/91  10/13/15 (!) 151/90   Gen: wd, wn, nad Oral cavity: MMM, no lesions Neck: supple, no lymphadenopathy, no thyromegaly, no masses Heart: RRR, normal S1, S2, no murmurs Lungs: CTA bilaterally, no wheezes, rhonchi, or rales Pulses: 2+ symmetric, upper and lower extremities, normal cap refill Ext: no edema    Assessment: Encounter Diagnoses  Name Primary?  . Type 2 diabetes mellitus with complication, with long-term current use of insulin ([place]) Yes  . Essential hypertension   . Chronic pancreatitis, unspecified pancreatitis type ([place])   . Metatarsalgia of right foot   . Right foot pain   . Depression with anxiety   . Hyperlipidemia, unspecified hyperlipidemia type   . Lumbar radiculopathy, acute   . High risk medication use   . Need for prophylactic vaccination and inoculation against influenza      Plan: Glad she is doing well of late.   discussed her interim health care.  Will request records from prior doctor in [place], Dr. Baird Kay at [facility].  I reviewed other prior visits from Dr. Nicoletta Ba from last 2 years, last labs form early 2017 in chart.   discussed that I may or may not take back over all of her medications pending chart review.   We called her pharmacy and pulled [place] controlled substances.  No unusual or worrisome findings.   Counseled on the influenza virus vaccine.  Vaccine information sheet given.  Influenza vaccine given after consent obtained.  Routine labs today  Kaislee was seen today for establish care.  Diagnoses and all orders for this visit:  Type 2 diabetes mellitus with complication, with long-term current use of insulin ([place]) -     Comprehensive metabolic panel -     CBC -     Lipid panel -     Hemoglobin A1c  Essential hypertension -     Lipid panel  Chronic pancreatitis, unspecified pancreatitis type ([facility])  Metatarsalgia of right  foot  Right foot pain  Depression with anxiety  Hyperlipidemia, unspecified hyperlipidemia type -     Lipid panel  Lumbar radiculopathy, acute  High risk medication use  Need for prophylactic vaccination and inoculation against influenza  Encounter for immunization -     Flu Vaccine QUAD 36+ mos IM

## 2016-02-23 NOTE — Telephone Encounter (Signed)
Records placed in your folder for review from [facility]. Sara Leon

## 2016-02-24 ENCOUNTER — Other Ambulatory Visit: Payer: Self-pay | Admitting: Medical

## 2016-02-24 DIAGNOSIS — F418 Other specified anxiety disorders: Secondary | ICD-10-CM

## 2016-02-24 DIAGNOSIS — E118 Type 2 diabetes mellitus with unspecified complications: Secondary | ICD-10-CM

## 2016-02-24 LAB — HEMOGLOBIN A1C
HEMOGLOBIN A1C: 7.1 % — AB (ref <5.7)
MEAN PLASMA GLUCOSE: 157 mg/dL

## 2016-02-24 MED ORDER — INSULIN DEGLUDEC 100 UNIT/ML ~~LOC~~ SOPN: 14.0000 [IU] | PEN_INJECTOR | Freq: Every day | SUBCUTANEOUS | 3 refills | Status: DC

## 2016-02-24 MED ORDER — ARIPIPRAZOLE 10 MG PO TABS: 10.0000 mg | ORAL_TABLET | Freq: Every day | ORAL | 0 refills | Status: DC

## 2016-02-24 MED ORDER — METFORMIN HCL 1000 MG PO TABS: 500.0000 mg | ORAL_TABLET | Freq: Two times a day (BID) | ORAL | 1 refills | Status: DC

## 2016-02-24 MED ORDER — PRAZOSIN HCL 2 MG PO CAPS: 2.0000 mg | ORAL_CAPSULE | Freq: Every day | ORAL | 1 refills | Status: DC

## 2016-02-24 MED ORDER — VITAMIN D (ERGOCALCIFEROL) 1.25 MG (50000 UNIT) PO CAPS: 50000.0000 [IU] | ORAL_CAPSULE | ORAL | 3 refills | Status: DC

## 2016-02-24 MED ORDER — ATENOLOL 50 MG PO TABS: ORAL_TABLET | ORAL | 1 refills | Status: DC

## 2016-02-24 MED ORDER — ATORVASTATIN CALCIUM 40 MG PO TABS: 40.0000 mg | ORAL_TABLET | Freq: Every day | ORAL | 1 refills | Status: DC

## 2016-02-24 MED ORDER — VENLAFAXINE HCL ER 150 MG PO CP24: 300.0000 mg | ORAL_CAPSULE | Freq: Every day | ORAL | 0 refills | Status: DC

## 2016-02-24 MED ORDER — RISPERIDONE 1 MG PO TABS: 1.0000 mg | ORAL_TABLET | Freq: Every day | ORAL | 0 refills | Status: DC

## 2016-02-27 ENCOUNTER — Other Ambulatory Visit: Payer: Self-pay

## 2016-02-27 MED ORDER — INSULIN DEGLUDEC 100 UNIT/ML ~~LOC~~ SOPN: 14.0000 [IU] | PEN_INJECTOR | Freq: Every day | SUBCUTANEOUS | 3 refills | Status: DC

## 2016-03-01 ENCOUNTER — Other Ambulatory Visit: Payer: Self-pay | Admitting: Osteopathic Medicine

## 2016-03-01 ENCOUNTER — Ambulatory Visit (INDEPENDENT_AMBULATORY_CARE_PROVIDER_SITE_OTHER): Payer: BLUE CROSS/BLUE SHIELD | Admitting: Osteopathic Medicine

## 2016-03-01 ENCOUNTER — Encounter: Payer: Self-pay | Admitting: Osteopathic Medicine

## 2016-03-01 VITALS — BP 117/86 | HR 55 | Ht 65.0 in | Wt 192.0 lb

## 2016-03-01 DIAGNOSIS — Z79899 Other long term (current) drug therapy: Secondary | ICD-10-CM

## 2016-03-01 DIAGNOSIS — Z0289 Encounter for other administrative examinations: Secondary | ICD-10-CM | POA: Diagnosis not present

## 2016-03-01 DIAGNOSIS — Z8659 Personal history of other mental and behavioral disorders: Secondary | ICD-10-CM

## 2016-03-01 DIAGNOSIS — F418 Other specified anxiety disorders: Secondary | ICD-10-CM | POA: Diagnosis not present

## 2016-03-01 MED ORDER — ATENOLOL 50 MG PO TABS: ORAL_TABLET | ORAL | 1 refills | Status: DC

## 2016-03-01 MED ORDER — CLONAZEPAM 1 MG PO TABS: 1.0000 mg | ORAL_TABLET | Freq: Two times a day (BID) | ORAL | 0 refills | Status: DC | PRN

## 2016-03-01 NOTE — Patient Instructions (Addendum)
With our long-term plan of getting you off of the alprazolam/on the better medication regimen to control your symptoms, I written a prescription today for clonazepam, which is a longer acting benzodiazepine compared to alprazolam, and should be easier to eventually transition off this medicine. This will be a 1 mg dose at 2 times per day. When you are due for a refill, please call the office, we will plan to have the next prescription at a slightly lower dose. If we are able to get you in to see psychiatry before then, their recommendations may change this management. Please let us know ASAP if you are having any questions, concerns, or problems with the medication. Please let us know if she has not heard about setting up an appointment with psychiatry by the middle of next week.

## 2016-03-01 NOTE — Progress Notes (Signed)
HPI: Sara Leon is a 50 y.o. female  who presents to [facility] today, 03/01/16,  for chief complaint of:  Chief Complaint  Patient presents with  . Establish Care    Patient presents to establish care. Previous patient of Dr. Ileene Rubens, who was managing all psychiatric medications. Patient had been on Effexor, Risperdal, Xanax for 20 years total after her diagnosis of PTSD, stress, and anxiety as well as depression. Recently Dr. Ileene Rubens started her on Abilify as well, she has also got a prescription for prazosin for nightmares. Since Dr. Ileene Rubens left, she has tended to establish care with another provider in the area however they declined to refill the alprazolam. Patient has been without this medication she says for about a day and has noticed significantly increased panic problems.     Past medical, surgical, social and family history reviewed: Past Medical History:  Diagnosis Date  . Anxiety   . Bladder incontinence    Urology, pending studies 05/2012  . Condyloma acuminatum of vulva   . Deep venous thrombosis of upper extremity (Post) 2011   due to PICC  . Depression   . Diabetes mellitus 10/2008   diet controlled  . Fatty liver    appears improved on imaging after weight loss  . GERD (gastroesophageal reflux disease)   . Hyperlipidemia   . Hypertension   . Osteoarthritis of hand    bilat 2nd and 3rd fingers at MCP, PIP  . Pancreatic insufficiency 10/16/2010  . 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 Pancreatitis chronic    on resection specimen  . Routine gynecological examination    Dr. Elonda Husky, [facility]  . Wears contact lenses    Past Surgical History:  Procedure Laterality Date  . ABDOMINAL HYSTERECTOMY  1987   Partial due to heavy bleeding, still has her ovaries  . CHOLECYSTECTOMY  2000  . Dobbhoff feeding tube  07/04/09   [facility]  . 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 Eugenia Pancoast)  . PANCREATICODUODENECTOMY  06/10/09   [facility], Dr. Eugenia Pancoast  . TONSILLECTOMY  1971  . UPPER ENDOSCOPIC ULTRASOUND W/ FNA  05/12/2009   Uncinate process mass - Dr. Jerene Pitch  . UPPER GASTROINTESTINAL ENDOSCOPY  2005 and 2010 - [place], [place]   gastritis 2005 and 2010, retained food 2005, no H. pyloi and duodenal bxs normal   Social History  Substance Use Topics  . Smoking status: Current Every Day Smoker    Packs/day: 1.00  . Smokeless tobacco: Never Used  . Alcohol use No     Comment: started AA 05/2012.  Last drink early January   Family History  Problem Relation Age of Onset  . Cancer Mother 52    died of melanoma  . Hypertension Father   . Heart disease Father     MI  . Hyperlipidemia Father   . Other Father     died in [place]  . Heart disease Paternal Grandfather   . Crohn's disease      nephew  . Colon cancer Maternal Grandmother   . Cancer Maternal Grandmother     colon  . Cancer Maternal Grandfather     colon     Current medication list and allergy/intolerance information reviewed:   Current Outpatient Prescriptions  Medication Sig Dispense Refill  . ALPRAZolam (XANAX) 1 MG tablet Take 1 tablet (1 mg total) by mouth 2 (two) times  daily as needed for anxiety. NEED FOLLOW UP APPOINTMENT FOR MORE REFILLS 60 tablet 0  . AMBULATORY NON FORMULARY MEDICATION Diabetic testing strips: OneTouch Verio: Use to check blood sugar up to three times a day. 50 Units 11  . ARIPiprazole (ABILIFY) 10 MG tablet Take 1 tablet (10 mg total) by mouth daily. 90 tablet 0  . atenolol (TENORMIN) 50 MG tablet TAKE 1.5 TABLETS (75 MG TOTAL) BY MOUTH DAILY. 135 tablet 1  . atorvastatin (LIPITOR) 40 MG tablet Take 1 tablet (40 mg total) by mouth daily. 90 tablet 1  . insulin degludec (TRESIBA FLEXTOUCH) 100 UNIT/ML SOPN FlexTouch Pen Inject 0.14 mLs (14 Units total) into the skin daily. 3 pen 3  . Insulin Pen Needle (B-D UF III MINI PEN  NEEDLES) 31G X 5 MM MISC Use to inject insulin daily. 50 each 11  . metFORMIN (GLUCOPHAGE) 1000 MG tablet Take 0.5 tablets (500 mg total) by mouth 2 (two) times daily with a meal. One by mouth twice a day for blood sugar control. 90 tablet 1  . prazosin (MINIPRESS) 2 MG capsule Take 1 capsule (2 mg total) by mouth at bedtime. 90 capsule 1  . risperiDONE (RISPERDAL) 1 MG tablet Take 1 tablet (1 mg total) by mouth at bedtime. 90 tablet 0  . venlafaxine XR (EFFEXOR-XR) 150 MG 24 hr capsule Take 2 capsules (300 mg total) by mouth daily with breakfast. 180 capsule 0  . Vitamin D, Ergocalciferol, (DRISDOL) 50000 units CAPS capsule Take 1 capsule (50,000 Units total) by mouth every 7 (seven) days. 12 capsule 3   No current facility-administered medications for this visit.    Allergies  Allergen Reactions  . Montelukast Cough  . Quinapril Cough      Review of Systems:  Constitutional:  No  fever, no chills, No recent illness  Cardiac: No  chest pain, No  pressure  Respiratory:  No  shortness of breath.   Neurologic: No  weakness, No  dizziness  Psychiatric: +concerns with depression, +concerns with anxiety, + sleep problems, +mood problems  Exam:  BP 117/86   Pulse (!) 55   Ht 5\' 5"  (1.651 m)   Wt 192 lb (87.1 kg)   BMI 31.95 kg/m   Constitutional: VS see above. General Appearance: alert, well-developed, well-nourished, NAD  Ears, Nose, Mouth, Throat: MMM  Neck: No masses, trachea midline. No thyroid enlargement. No tenderness/mass appreciated. No lymphadenopathy  Respiratory: Normal respiratory effort. no wheeze, no rhonchi, no rales  Cardiovascular: S1/S2 normal, no murmur, no rub/gallop auscultated. RRR.   Musculoskeletal: Gait normal.   Neurological: Normal balance/coordination. No tremor.    Skin: warm, dry, intact. No rash/ulcer.  Psychiatric: Normal judgment/insight. Normal mood and affect. Oriented x3. No SI/HI. No thought disorder, normal speech.     Recent  Results (from the past 2160 hour(s))  Comprehensive metabolic panel     Status: None   Collection Time: 02/23/16 10:26 AM  Result Value Ref Range   Sodium 137 135 - 146 mmol/L   Potassium 4.3 3.5 - 5.3 mmol/L   Chloride 105 98 - 110 mmol/L   CO2 22 20 - 31 mmol/L   Glucose, Bld 92 65 - 99 mg/dL   BUN 9 7 - 25 mg/dL   Creat 0.62 0.50 - 1.05 mg/dL    Comment:   For patients > or = 52 years of age: The upper reference limit for Creatinine is approximately 13% higher for people identified as African-American.      Total Bilirubin  0.8 0.2 - 1.2 mg/dL   Alkaline Phosphatase 99 33 - 130 U/L   AST 19 10 - 35 U/L   ALT 21 6 - 29 U/L   Total Protein 7.0 6.1 - 8.1 g/dL   Albumin 4.1 3.6 - 5.1 g/dL   Calcium 9.0 8.6 - 10.4 mg/dL  CBC     Status: None   Collection Time: 02/23/16 10:26 AM  Result Value Ref Range   WBC 9.3 4.0 - 10.5 K/uL   RBC 4.62 3.80 - 5.10 MIL/uL   Hemoglobin 14.0 11.7 - 15.5 g/dL   HCT 41.7 35.0 - 45.0 %   MCV 90.3 80.0 - 100.0 fL   MCH 30.3 27.0 - 33.0 pg   MCHC 33.6 32.0 - 36.0 g/dL   RDW 13.6 11.0 - 15.0 %   Platelets 308 140 - 400 K/uL   MPV 10.0 7.5 - 12.5 fL  Lipid panel     Status: Abnormal   Collection Time: 02/23/16 10:26 AM  Result Value Ref Range   Cholesterol 93 (L) 125 - 200 mg/dL   Triglycerides 126 <150 mg/dL   HDL 35 (L) >=46 mg/dL   Total CHOL/HDL Ratio 2.7 <=5.0 Ratio   VLDL 25 <30 mg/dL   LDL Cholesterol 33 <130 mg/dL    Comment:   Total Cholesterol/HDL Ratio:CHD Risk                        Coronary Heart Disease Risk Table                                        Men       Women          1/2 Average Risk              3.4        3.3              Average Risk              5.0        4.4           2X Average Risk              9.6        7.1           3X Average Risk             23.4       11.0 Use the calculated Patient Ratio above and the CHD Risk table  to determine the patient's CHD Risk.   Hemoglobin A1c     Status: Abnormal    Collection Time: 02/23/16 10:26 AM  Result Value Ref Range   Hgb A1c MFr Bld 7.1 (H) <5.7 %    Comment:   For someone without known diabetes, a hemoglobin A1c value of 6.5% or greater indicates that they may have diabetes and this should be confirmed with a follow-up test.   For someone with known diabetes, a value <7% indicates that their diabetes is well controlled and a value greater than or equal to 7% indicates suboptimal control. A1c targets should be individualized based on duration of diabetes, age, comorbid conditions, and other considerations.   Currently, no consensus exists for use of hemoglobin A1c for diagnosis of diabetes for children.      Mean Plasma Glucose 157 mg/dL  Recent labs and recent records reviewed from other provider. They refilled other medications but declined to continue the alprazolam.   ASSESSMENT/PLAN:   Patient is amenable to coming off of the alprazolam. We'll transition to clonazepam at this point with eventual plan to taper.   Would like to get psychiatry involved, given complicated and factors such as PTSD and other mood issues, would like their assistance with medication management and possibly coming up with a better regimen for her overall which can prevent panic in the first place without benzodiazepine use if possible.   Urine drug screen was ordered for initiation of controlled substance contract. Patient expresses some questions regarding what will happen if this is positive. Depending on substances which she may may not be taking, this may affect controlled substances prescribed by this office. If she does not get UDS done today in good faith, I will certainly not continue the medication long-term but will allow for tapering to avoid withdrawal  Patient lives in [place], travels a lot, gets medications filled at multiple different pharmacies. For initiation of this contract, we are specifically going to work through [place] on [employer] in [place] so that we can monitor prescription drug database for controlled substances, patient is aware of this stipulation and has signed controlled substance contract today.  Depression with anxiety - Plan: clonazePAM (KLONOPIN) 1 MG tablet, Drug Screen, Urine, Ambulatory referral to Psychiatry  History of posttraumatic stress disorder (PTSD) - Plan: clonazePAM (KLONOPIN) 1 MG tablet, Drug Screen, Urine, Ambulatory referral to Psychiatry  Benzodiazepine use agreement exists - Plan: clonazePAM (KLONOPIN) 1 MG tablet, Drug Screen, Urine, Ambulatory referral to Psychiatry      Patient Instructions  With our long-term plan of getting you off of the alprazolam/on the better medication regimen to control your symptoms, I written a prescription today for clonazepam, which is a longer acting benzodiazepine compared to alprazolam, and should be easier to eventually transition off this medicine. This will be a 1 mg dose at 2 times per day. When you are due for a refill, please call the office, we will plan to have the next prescription at a slightly lower dose. If we are able to get you in to see psychiatry before then, their recommendations may change this management. Please let us know ASAP if you are having any questions, concerns, or problems with the medication. Please let us know if she has not heard about setting up an appointment with psychiatry by the middle of next week.    Visit summary with medication list and pertinent instructions was printed for patient to review. All questions at time of visit were answered - patient instructed to contact office with any additional concerns. ER/RTC precautions were reviewed with the patient. Follow-up plan: Return in about 3 months (around 06/01/2016) for DM2 management. If unable to get seen by psychiatry, follow up with Dr. Sheppard Coil in 4 weeks. .  Note: Total time spent 40 minutes, greater than 50% of the visit was spent face-to-face counseling  and coordinating care for the following: The primary encounter diagnosis was Depression with anxiety. Diagnoses of History of posttraumatic stress disorder (PTSD) and Benzodiazepine use agreement exists were also pertinent to this visit.Marland Kitchen

## 2016-03-07 ENCOUNTER — Encounter: Payer: Self-pay | Admitting: Medical

## 2016-03-09 LAB — DRUG ABUSE PANEL 10-50, U
AMPHETAMINES (1000 ng/mL SCRN): NEGATIVE
BARBITURATES: NEGATIVE
BENZODIAZEPINES: POSITIVE — AB
COCAINE METABOLITES: NEGATIVE
MARIJUANA MET (50 NG/ML SCRN): POSITIVE — AB
METHADONE: NEGATIVE
METHAQUALONE: NEGATIVE
OPIATES: NEGATIVE
PHENCYCLIDINE: NEGATIVE
PROPOXYPHENE: NEGATIVE

## 2016-03-20 ENCOUNTER — Ambulatory Visit (HOSPITAL_COMMUNITY): Payer: BLUE CROSS/BLUE SHIELD | Admitting: Psychiatry

## 2016-03-29 ENCOUNTER — Other Ambulatory Visit: Payer: Self-pay | Admitting: Osteopathic Medicine

## 2016-03-29 ENCOUNTER — Telehealth: Payer: Self-pay | Admitting: Osteopathic Medicine

## 2016-03-29 DIAGNOSIS — Z8659 Personal history of other mental and behavioral disorders: Secondary | ICD-10-CM

## 2016-03-29 DIAGNOSIS — F418 Other specified anxiety disorders: Secondary | ICD-10-CM

## 2016-03-29 DIAGNOSIS — Z79899 Other long term (current) drug therapy: Secondary | ICD-10-CM

## 2016-03-29 MED ORDER — ATENOLOL 100 MG PO TABS: 50.0000 mg | ORAL_TABLET | Freq: Every day | ORAL | 3 refills | Status: DC

## 2016-03-29 MED ORDER — ATENOLOL 25 MG PO TABS: 25.0000 mg | ORAL_TABLET | Freq: Every day | ORAL | 3 refills | Status: DC

## 2016-03-29 NOTE — Telephone Encounter (Signed)
Received fax from [facility]. Atenolol 50mg  tab on backorder. Can send Rx for 25mg  (take 2 tabs) or 100mg  (take 0.5 tab). Will route to PCP

## 2016-03-29 NOTE — Telephone Encounter (Signed)
I think she was taking 50 mg at 1-1/2 tablets for 75 mg daily, her blood pressure was good enough that I think we can do 100 mg half a tablet for 50 mg daily if the patient is okay with this, or we can try a different pharmacy depending on her preference.

## 2016-03-29 NOTE — Telephone Encounter (Signed)
Spoke with Pt. She is taking 75mg  Atenolol daily. Per Pt preference, will send new Rx for 100mg  tabs (take 0.5) and 25mg  tabs (take 1). No further questions.

## 2016-03-30 NOTE — Telephone Encounter (Signed)
Patient request refill for Klonopin . Please advise. Rhonda Cunningham,CMA

## 2016-03-30 NOTE — Telephone Encounter (Signed)
OK to refill but will need followup for any refills after this. Plan was to get her into psychiatry, from last note: Follow-up plan: Return in about 3 months (around 06/01/2016) for DM2 management. If unable to get seen by psychiatry, follow up with Dr. Sheppard Coil in 4 weeks.

## 2016-03-30 NOTE — Telephone Encounter (Signed)
Spoke to patient gave her results as noted below. Sara Leon,CMA  

## 2016-04-11 ENCOUNTER — Ambulatory Visit (INDEPENDENT_AMBULATORY_CARE_PROVIDER_SITE_OTHER): Payer: BLUE CROSS/BLUE SHIELD | Admitting: Psychiatry

## 2016-04-11 ENCOUNTER — Encounter (HOSPITAL_COMMUNITY): Payer: Self-pay | Admitting: Psychiatry

## 2016-04-11 VITALS — BP 110/84 | HR 90 | Ht 65.0 in | Wt 187.0 lb

## 2016-04-11 DIAGNOSIS — F411 Generalized anxiety disorder: Secondary | ICD-10-CM

## 2016-04-11 DIAGNOSIS — F331 Major depressive disorder, recurrent, moderate: Secondary | ICD-10-CM

## 2016-04-11 DIAGNOSIS — Z9889 Other specified postprocedural states: Secondary | ICD-10-CM

## 2016-04-11 DIAGNOSIS — F418 Other specified anxiety disorders: Secondary | ICD-10-CM

## 2016-04-11 DIAGNOSIS — Z888 Allergy status to other drugs, medicaments and biological substances status: Secondary | ICD-10-CM

## 2016-04-11 DIAGNOSIS — F4310 Post-traumatic stress disorder, unspecified: Secondary | ICD-10-CM

## 2016-04-11 DIAGNOSIS — Z79899 Other long term (current) drug therapy: Secondary | ICD-10-CM

## 2016-04-11 DIAGNOSIS — Z794 Long term (current) use of insulin: Secondary | ICD-10-CM

## 2016-04-11 DIAGNOSIS — Z8249 Family history of ischemic heart disease and other diseases of the circulatory system: Secondary | ICD-10-CM

## 2016-04-11 DIAGNOSIS — Z800 Family history of malignant neoplasm of digestive organs: Secondary | ICD-10-CM

## 2016-04-11 DIAGNOSIS — F17210 Nicotine dependence, cigarettes, uncomplicated: Secondary | ICD-10-CM

## 2016-04-11 MED ORDER — BUSPIRONE HCL 7.5 MG PO TABS: 7.5000 mg | ORAL_TABLET | Freq: Two times a day (BID) | ORAL | 0 refills | Status: DC | PRN
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
## 2016-04-23 ENCOUNTER — Telehealth: Payer: Self-pay | Admitting: *Deleted

## 2016-04-23 ENCOUNTER — Other Ambulatory Visit: Payer: Self-pay | Admitting: *Deleted

## 2016-04-23 DIAGNOSIS — E118 Type 2 diabetes mellitus with unspecified complications: Secondary | ICD-10-CM

## 2016-04-23 MED ORDER — INSULIN PEN NEEDLE 31G X 5 MM MISC: 11 refills | Status: DC

## 2016-04-23 NOTE — Telephone Encounter (Signed)
If he is the last person she saw for anxiety/depression issues, then yes.

## 2016-04-23 NOTE — Telephone Encounter (Signed)
Received refill request for gen effexor. I see that she is seeng Dr.Aktar. Would you like for this request to go to him?

## 2016-04-23 NOTE — Telephone Encounter (Signed)
Faxed request back to pharm letting them know [facility] needs to manage this

## 2016-04-24 ENCOUNTER — Telehealth: Payer: Self-pay | Admitting: *Deleted

## 2016-04-24 ENCOUNTER — Other Ambulatory Visit (HOSPITAL_COMMUNITY): Payer: Self-pay | Admitting: *Deleted

## 2016-04-24 DIAGNOSIS — F418 Other specified anxiety disorders: Secondary | ICD-10-CM

## 2016-04-24 MED ORDER — VENLAFAXINE HCL ER 150 MG PO CP24: 300.0000 mg | ORAL_CAPSULE | Freq: Every day | ORAL | 0 refills | Status: DC

## 2016-04-24 NOTE — Telephone Encounter (Signed)
Received call from pt requesting a refill for Effexor. There is a prescription in the system written on 10/13 for a 90 day supply sent to a [facility] in [place], [place]. Pt states she is currently using [place], [place].   Estate manager/land agent, spoke with [place]. Pt has not picked up prescription. Please review and advise. Thank you.

## 2016-04-24 NOTE — Telephone Encounter (Signed)
Received refill request for metformin. The instructions on the prescription have two different sets of directions and the directions I have from the pharmacy are also different. Please send a new prescription with the directions that  you would like to be on the prescription. This is a request from the [facility] in [place], [place]

## 2016-04-24 NOTE — Telephone Encounter (Signed)
It looks like this was originally written by another prescriber not in our practice. The usual dose for metformin is 1000 mg twice per day, whenever the patient is currently taking we can refill - please call and confirm it with the patient. When last I saw the patient our visit was dedicated mainly to psychiatric follow-up. Would recommend that she have a visit with me sometime after January 19 at which point she will be due for repeat A1c anyway.

## 2016-04-24 NOTE — Telephone Encounter (Signed)
Per Dr. De Nurse, may send refill to [facility] for Effexor 150mg , #60. Please informed pt only 1 month supply will be sent to pharmacy. Any changes to medications will be discussed at the next visit. Pt f/u apt is schedule on 12/22. Called and informed pt of refill status. Pt verbalizes understanding.

## 2016-05-01 ENCOUNTER — Other Ambulatory Visit (HOSPITAL_COMMUNITY): Payer: Self-pay | Admitting: Psychiatry

## 2016-05-02 ENCOUNTER — Ambulatory Visit: Payer: Self-pay | Admitting: Sports Medicine

## 2016-05-02 NOTE — Telephone Encounter (Signed)
Received fax from [facility] requesting a refill for Buspar. Per Dr. De Nurse, refill request is denied. Pt has request refill for Buspar too early. Medication was filled on 11/29. Pt f/u apt os schedule on 12/22. lvm informing pt of refill status.

## 2016-05-03 ENCOUNTER — Ambulatory Visit (INDEPENDENT_AMBULATORY_CARE_PROVIDER_SITE_OTHER): Payer: BLUE CROSS/BLUE SHIELD | Admitting: Osteopathic Medicine

## 2016-05-03 ENCOUNTER — Encounter: Payer: Self-pay | Admitting: Osteopathic Medicine

## 2016-05-03 VITALS — BP 124/74 | HR 67 | Temp 98.8°F | Ht 65.0 in | Wt 193.0 lb

## 2016-05-03 DIAGNOSIS — R51 Headache: Secondary | ICD-10-CM

## 2016-05-03 DIAGNOSIS — M25551 Pain in right hip: Secondary | ICD-10-CM

## 2016-05-03 DIAGNOSIS — J029 Acute pharyngitis, unspecified: Secondary | ICD-10-CM

## 2016-05-03 DIAGNOSIS — M25552 Pain in left hip: Secondary | ICD-10-CM

## 2016-05-03 DIAGNOSIS — R519 Headache, unspecified: Secondary | ICD-10-CM

## 2016-05-03 LAB — POCT RAPID STREP A (OFFICE): Rapid Strep A Screen: NEGATIVE

## 2016-05-03 MED ORDER — GUAIFENESIN-CODEINE 100-10 MG/5ML PO SYRP: 5.0000 mL | ORAL_SOLUTION | Freq: Three times a day (TID) | ORAL | 0 refills | Status: DC | PRN

## 2016-05-03 MED ORDER — IPRATROPIUM BROMIDE 0.03 % NA SOLN: 2.0000 | Freq: Three times a day (TID) | NASAL | 0 refills | Status: DC

## 2016-05-03 MED ORDER — ATENOLOL 25 MG PO TABS: 50.0000 mg | ORAL_TABLET | Freq: Every day | ORAL | 3 refills | Status: DC

## 2016-05-03 MED ORDER — AMOXICILLIN-POT CLAVULANATE 875-125 MG PO TABS: 1.0000 | ORAL_TABLET | Freq: Two times a day (BID) | ORAL | 0 refills | Status: DC

## 2016-05-03 MED ORDER — METHYLPREDNISOLONE 4 MG PO TBPK: ORAL_TABLET | ORAL | 0 refills | Status: DC

## 2016-05-03 MED ORDER — LIDOCAINE VISCOUS HCL 2 % MT SOLN: 10.0000 mL | OROMUCOSAL | 0 refills | Status: DC | PRN

## 2016-05-03 NOTE — Patient Instructions (Signed)

## 2016-05-03 NOTE — Progress Notes (Signed)
HPI: Sara Leon is a 50 y.o. female who presents to [facility] 05/03/16 for chief complaint of:  Chief Complaint  Patient presents with  . Sore Throat  . Headache    Acute Illness: . Location: neck/sore throat   . Quality: hoarseness, runny nose, occasional cough . Assoc signs/symptoms: see ROS . Duration: 4 days . Modifying factors: has tried the following OTC/Rx medications: none  Hip pain bilateral . Location: bilateral . Quality: soreness, stiffness, problems walking . Duration: years . Assoc signs/symptoms: arthritis other joints    Past medical, social and family history reviewed. Current medications and allergies reviewed.     Review of Systems:  Constitutional: no fever, some chills  HEENT: Yes  headache, Yes  sore throat, Yes  swollen glands  Cardiovascular: No chest pain  Respiratory:No  cough, No  shortness of breath  Gastrointestinal: No  nausea, Novomiting,  No  diarrhea  Musculoskeletal:   No  myalgia/arthralgia  Skin/Integument:  No  rash   Exam:  BP 124/74   Pulse 67   Temp 98.8 F (37.1 C)   Ht 5\' 5"  (1.651 m)   Wt 193 lb (87.5 kg)   BMI 32.12 kg/m   Constitutional: VSS, see above. General Appearance: alert, well-developed, well-nourished, NAD  Eyes: Normal lids and conjunctive, non-icteric sclera, PERRLA  Ears, Nose, Mouth, Throat: Normal external inspection ears/nares/mouth/lips/gums, normal TM, MMM; posterior pharynx without erythema, without exudate, nasal mucosa normal  Neck: No masses, trachea midline. normal lymph nodes  Respiratory: Normal respiratory effort. No  wheeze/rhonchi/rales  Cardiovascular: S1/S2 normal, no murmur/rub/gallop auscultated. RRR.  Results for orders placed or performed in visit on 05/03/16 (from the past 72 hour(s))  POCT rapid strep A     Status: None   Collection Time: 05/03/16  1:30 PM  Result Value Ref Range   Rapid Strep A Screen Negative Negative    MODIFIED CENTOR CRITERIA (ponts if "yes"): Tonsillar exudate (1): no Tender Ant Cervical LN (1): yes Absence of cough (1): mild cough, +/- Fever (1): no Age  62-14 (1): no 15-45 (0): no >/= 45 (-1): yes SCORE: 0-1 TREATMENT: -1,0,1 = SUPPORTIVE CARE 2 - 3 = TEST, TX (+)SWAB, CX (-)SWAB 4 - 5 = TX    ASSESSMENT/PLAN:  Sore throat - Plan: POCT rapid strep A, methylPREDNISolone (MEDROL DOSEPAK) 4 MG TBPK tablet  Nonintractable headache, unspecified chronicity pattern, unspecified headache type - Plan: POCT rapid strep A  Hip pain, bilateral - We'll get x-ray, chronic problem, recommended follow-up with Dr. Georgina Snell or Dr. Darene Lamer - Plan: XR HIPS BILAT W OR W/O PELVIS 3-4 VIEWS  Viral pharyngitis     Patient Instructions  Note: the following list assumes no pregnancy, normal liver & kidney function and no other drug interactions. Dr. Sheppard Coil has highlighted medications which are safe for you to use, but these may not be appropriate for everyone. Always ask a pharmacist or qualified medical provider if there are any questions!    Aches/Pains, Fever Acetaminophen (Tylenol) 500 mg tablets - take max 2 tablets (1000 mg) every 6 hours (4 times per day)  Ibuprofen (Motrin) 200 mg tablets - take max 4 tablets (800 mg) every 6 hours  Sinus Congestion Prescription Atrovent Cromolyn Nasal Spray (NasalCrom) 1 spray each nostril 3-4 times per day, max 6 imes per day Nasal Saline if desired Oxymetolazone (Afrin, others) sparing use due to rebound congestion Phenylephrine (Sudafed) 10 mg tablets every 4 hours (or the 12-hour formulation) Diphenhydramine (Benadryl)  25 mg tablets - take max 2 tablets every 4 hours  Cough & Sore Throat Prescription cough pills or syrups Dextromethorphan (Robitussin, others) - cough suppressant Guaifenesin (Robitussin, Mucinex, others) - expectorant (helps cough up mucus) (Dextromethorphan and Guaifenesin also come in a combination tablet) Lozenges w/  Benzocaine + Menthol (Cepacol) Honey - as much as you want! Teas which "coat the throat" - look for ingredients Elm Bark, Licorice Root, Marshmallow Root  Other Zinc Lozenges within 24 hours of symptoms onset - mixed evidence this shortens the duration of the common cold Don't waste your money on Vitamin C or Echinacea       Visit summary was printed for the patient with medications and pertinent instructions for patient to review. ER/RTC precautions reviewed. All questions answered. Return in about 6 weeks (around 06/14/2016) for diabetes follow-up, sooner if needed.

## 2016-05-04 ENCOUNTER — Encounter: Payer: Self-pay | Admitting: Osteopathic Medicine

## 2016-05-04 ENCOUNTER — Ambulatory Visit (INDEPENDENT_AMBULATORY_CARE_PROVIDER_SITE_OTHER): Payer: BLUE CROSS/BLUE SHIELD | Admitting: Sports Medicine

## 2016-05-04 ENCOUNTER — Encounter: Payer: Self-pay | Admitting: Sports Medicine

## 2016-05-04 ENCOUNTER — Ambulatory Visit (INDEPENDENT_AMBULATORY_CARE_PROVIDER_SITE_OTHER): Payer: BLUE CROSS/BLUE SHIELD | Admitting: Psychiatry

## 2016-05-04 ENCOUNTER — Encounter (HOSPITAL_COMMUNITY): Payer: Self-pay | Admitting: Psychiatry

## 2016-05-04 ENCOUNTER — Ambulatory Visit (INDEPENDENT_AMBULATORY_CARE_PROVIDER_SITE_OTHER): Payer: BLUE CROSS/BLUE SHIELD

## 2016-05-04 VITALS — BP 136/90 | HR 72 | Resp 16 | Ht 65.0 in | Wt 192.8 lb

## 2016-05-04 DIAGNOSIS — Z888 Allergy status to other drugs, medicaments and biological substances status: Secondary | ICD-10-CM

## 2016-05-04 DIAGNOSIS — M7062 Trochanteric bursitis, left hip: Secondary | ICD-10-CM

## 2016-05-04 DIAGNOSIS — M25551 Pain in right hip: Secondary | ICD-10-CM | POA: Diagnosis not present

## 2016-05-04 DIAGNOSIS — F418 Other specified anxiety disorders: Secondary | ICD-10-CM

## 2016-05-04 DIAGNOSIS — F411 Generalized anxiety disorder: Secondary | ICD-10-CM | POA: Diagnosis not present

## 2016-05-04 DIAGNOSIS — Z9889 Other specified postprocedural states: Secondary | ICD-10-CM

## 2016-05-04 DIAGNOSIS — M7061 Trochanteric bursitis, right hip: Secondary | ICD-10-CM | POA: Diagnosis not present

## 2016-05-04 DIAGNOSIS — Z808 Family history of malignant neoplasm of other organs or systems: Secondary | ICD-10-CM

## 2016-05-04 DIAGNOSIS — Z800 Family history of malignant neoplasm of digestive organs: Secondary | ICD-10-CM

## 2016-05-04 DIAGNOSIS — F331 Major depressive disorder, recurrent, moderate: Secondary | ICD-10-CM

## 2016-05-04 DIAGNOSIS — F4310 Post-traumatic stress disorder, unspecified: Secondary | ICD-10-CM | POA: Diagnosis not present

## 2016-05-04 DIAGNOSIS — F17210 Nicotine dependence, cigarettes, uncomplicated: Secondary | ICD-10-CM

## 2016-05-04 DIAGNOSIS — M25552 Pain in left hip: Secondary | ICD-10-CM

## 2016-05-04 DIAGNOSIS — Z79899 Other long term (current) drug therapy: Secondary | ICD-10-CM

## 2016-05-04 DIAGNOSIS — Z8249 Family history of ischemic heart disease and other diseases of the circulatory system: Secondary | ICD-10-CM

## 2016-05-04 MED ORDER — PRAZOSIN HCL 1 MG PO CAPS: 1.0000 mg | ORAL_CAPSULE | Freq: Every day | ORAL | 0 refills | Status: DC

## 2016-05-04 MED ORDER — RISPERIDONE 1 MG PO TABS: 1.0000 mg | ORAL_TABLET | Freq: Every day | ORAL | 0 refills | Status: DC

## 2016-05-04 MED ORDER — BUSPIRONE HCL 7.5 MG PO TABS: 7.5000 mg | ORAL_TABLET | Freq: Two times a day (BID) | ORAL | 0 refills | Status: DC | PRN

## 2016-05-04 MED ORDER — MELOXICAM 15 MG PO TABS: ORAL_TABLET | ORAL | 3 refills | Status: DC

## 2016-05-04 MED ORDER — VENLAFAXINE HCL ER 150 MG PO CP24: 300.0000 mg | ORAL_CAPSULE | Freq: Every day | ORAL | 1 refills | Status: DC

## 2016-05-04 NOTE — Assessment & Plan Note (Signed)
Starting from scratch, we will treat conservatively with meloxicam, rehabilitation exercises. X-rays did show degenerative changes in both hips but her pain is not referable to the joint today.  Return in one month, injection if no better.
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
## 2016-05-04 NOTE — Progress Notes (Signed)
   Subjective:    I'm seeing this patient as a consultation for:  Dr. Emeterio Reeve  CC: Bilateral hip pain  HPI: For several months this pleasant 50 year old female has had pain that she localizes on the lateral aspect of both hips, worse with getting and standing up after a seated position for a while, but also has pain laying on the lateral side. Pain is localized without radiation.  Past medical history:  Negative.  See flowsheet/record as well for more information.  Surgical history: Negative.  See flowsheet/record as well for more information.  Family history: Negative.  See flowsheet/record as well for more information.  Social history: Negative.  See flowsheet/record as well for more information.  Allergies, and medications have been entered into the medical record, reviewed, and no changes needed.   Review of Systems: No headache, visual changes, nausea, vomiting, diarrhea, constipation, dizziness, abdominal pain, skin rash, fevers, chills, night sweats, weight loss, swollen lymph nodes, body aches, joint swelling, muscle aches, chest pain, shortness of breath, mood changes, visual or auditory hallucinations.   Objective:   General: Well Developed, well nourished, and in no acute distress.  Neuro/Psych: Alert and oriented x3, extra-ocular muscles intact, able to move all 4 extremities, sensation grossly intact. Skin: Warm and dry, no rashes noted.  Respiratory: Not using accessory muscles, speaking in full sentences, trachea midline.  Cardiovascular: Pulses palpable, no extremity edema. Abdomen: Does not appear distended. Bilateral Hip: ROM IR: 60 Deg, no pain with internal rotation, ER: 60 Deg, Flexion: 120 Deg, Extension: 100 Deg, Abduction: 45 Deg, Adduction: 45 Deg Strength IR: 5/5, ER: 5/5, Flexion: 5/5, Extension: 5/5, Abduction: 5/5, Adduction: 5/5 Pelvic alignment unremarkable to inspection and palpation. Standing hip rotation and gait without trendelenburg /  unsteadiness. Left and right greater trochanters with tenderness to palpation. No tenderness over piriformis. No SI joint tenderness and normal minimal SI movement.  Impression and Recommendations:   This case required medical decision making of moderate complexity.  Greater trochanteric bursitis of both hips Starting from scratch, we will treat conservatively with meloxicam, rehabilitation exercises. X-rays did show degenerative changes in both hips but her pain is not referable to the joint today.  Return in one month, injection if no better.

## 2016-05-11 ENCOUNTER — Other Ambulatory Visit: Payer: Self-pay

## 2016-05-11 MED ORDER — GUAIFENESIN-CODEINE 100-10 MG/5ML PO SYRP: 5.0000 mL | ORAL_SOLUTION | Freq: Three times a day (TID) | ORAL | 0 refills | Status: DC | PRN

## 2016-05-16 ENCOUNTER — Telehealth: Payer: Self-pay

## 2016-05-16 DIAGNOSIS — J029 Acute pharyngitis, unspecified: Secondary | ICD-10-CM

## 2016-05-16 MED ORDER — METHYLPREDNISOLONE 4 MG PO TBPK: ORAL_TABLET | ORAL | 0 refills | Status: DC

## 2016-05-16 NOTE — Telephone Encounter (Signed)
No need to call pt, message given .

## 2016-05-16 NOTE — Telephone Encounter (Signed)
Patient called back again wanting to know if she need an office visit for the raspy voice or if something can be called in to her pharmacy. Sara Leon,CMA

## 2016-05-16 NOTE — Telephone Encounter (Signed)
Patient called adv that she was seen on 05/03/16 and still not better (voice still gone) Pt would like to know if she can get more antibiotics called in today. Thanks

## 2016-05-16 NOTE — Telephone Encounter (Signed)
She should not need additional antibiotics, the hoarseness should get better on its own however we can try another round of steroids and after that if she is not doing any better I would send to ear nose and throat specialist. Steroids were sent in to her pharmacy. If no better, let us know

## 2016-05-23 ENCOUNTER — Other Ambulatory Visit (HOSPITAL_COMMUNITY): Payer: Self-pay | Admitting: Psychiatry

## 2016-06-01 ENCOUNTER — Ambulatory Visit: Payer: BLUE CROSS/BLUE SHIELD | Admitting: Osteopathic Medicine

## 2016-06-07 ENCOUNTER — Other Ambulatory Visit: Payer: Self-pay | Admitting: Osteopathic Medicine

## 2016-06-18 NOTE — Telephone Encounter (Signed)
Medication refill- received fax from [facility] requesting a refill for Buspar. Per dr. De Nurse, refill is authorize for Buspar 7.5mg , #45. Refill was sent to pharmacy. Called and informed pt of refill statis. Pt verbalizes understanding. Pt's next apt is schedule on 06/29/16.

## 2016-06-19 ENCOUNTER — Other Ambulatory Visit (HOSPITAL_COMMUNITY): Payer: Self-pay | Admitting: Psychiatry

## 2016-06-19 DIAGNOSIS — F418 Other specified anxiety disorders: Secondary | ICD-10-CM

## 2016-06-21 MED ORDER — BUSPIRONE HCL 7.5 MG PO TABS: 7.5000 mg | ORAL_TABLET | Freq: Two times a day (BID) | ORAL | 0 refills | Status: DC | PRN

## 2016-06-21 NOTE — Telephone Encounter (Signed)
Medication refill- received fax from [facility] requesting a refill for Buspar and Effexor. Per Dr. De Nurse, refill is authorize for Buspar 7.5mg , #45 and Effexor 150mg , #60. Refills  were sent to pharmacy. Called and informed pt of refill status. Pt verbalizes understanding. Pt's next apt is schedule on 06/29/16.

## 2016-06-29 ENCOUNTER — Ambulatory Visit (HOSPITAL_COMMUNITY): Payer: Self-pay | Admitting: Psychiatry

## 2016-07-03 ENCOUNTER — Ambulatory Visit (INDEPENDENT_AMBULATORY_CARE_PROVIDER_SITE_OTHER): Payer: BLUE CROSS/BLUE SHIELD | Admitting: Psychiatry

## 2016-07-03 ENCOUNTER — Encounter (HOSPITAL_COMMUNITY): Payer: Self-pay | Admitting: Psychiatry

## 2016-07-03 VITALS — BP 124/72 | HR 68 | Resp 16 | Ht 65.0 in | Wt 188.0 lb

## 2016-07-03 DIAGNOSIS — M19042 Primary osteoarthritis, left hand: Secondary | ICD-10-CM

## 2016-07-03 DIAGNOSIS — Z808 Family history of malignant neoplasm of other organs or systems: Secondary | ICD-10-CM

## 2016-07-03 DIAGNOSIS — I10 Essential (primary) hypertension: Secondary | ICD-10-CM

## 2016-07-03 DIAGNOSIS — F331 Major depressive disorder, recurrent, moderate: Secondary | ICD-10-CM | POA: Diagnosis not present

## 2016-07-03 DIAGNOSIS — Z794 Long term (current) use of insulin: Secondary | ICD-10-CM

## 2016-07-03 DIAGNOSIS — Z8659 Personal history of other mental and behavioral disorders: Secondary | ICD-10-CM

## 2016-07-03 DIAGNOSIS — Z9049 Acquired absence of other specified parts of digestive tract: Secondary | ICD-10-CM

## 2016-07-03 DIAGNOSIS — F17210 Nicotine dependence, cigarettes, uncomplicated: Secondary | ICD-10-CM

## 2016-07-03 DIAGNOSIS — Z0289 Encounter for other administrative examinations: Secondary | ICD-10-CM

## 2016-07-03 DIAGNOSIS — Z79899 Other long term (current) drug therapy: Secondary | ICD-10-CM

## 2016-07-03 DIAGNOSIS — M19041 Primary osteoarthritis, right hand: Secondary | ICD-10-CM

## 2016-07-03 DIAGNOSIS — F418 Other specified anxiety disorders: Secondary | ICD-10-CM | POA: Diagnosis not present

## 2016-07-03 DIAGNOSIS — Z8249 Family history of ischemic heart disease and other diseases of the circulatory system: Secondary | ICD-10-CM

## 2016-07-03 DIAGNOSIS — Z888 Allergy status to other drugs, medicaments and biological substances status: Secondary | ICD-10-CM

## 2016-07-03 DIAGNOSIS — Z800 Family history of malignant neoplasm of digestive organs: Secondary | ICD-10-CM

## 2016-07-03 DIAGNOSIS — F4310 Post-traumatic stress disorder, unspecified: Secondary | ICD-10-CM

## 2016-07-03 DIAGNOSIS — E119 Type 2 diabetes mellitus without complications: Secondary | ICD-10-CM

## 2016-07-03 DIAGNOSIS — Z90710 Acquired absence of both cervix and uterus: Secondary | ICD-10-CM

## 2016-07-03 DIAGNOSIS — Z8349 Family history of other endocrine, nutritional and metabolic diseases: Secondary | ICD-10-CM

## 2016-07-03 DIAGNOSIS — Z9889 Other specified postprocedural states: Secondary | ICD-10-CM

## 2016-07-03 MED ORDER — CLONAZEPAM 0.5 MG PO TABS: 0.5000 mg | ORAL_TABLET | Freq: Every day | ORAL | 1 refills | Status: DC

## 2016-07-03 MED ORDER — PRAZOSIN HCL 1 MG PO CAPS
1.0000 mg | ORAL_CAPSULE | Freq: Every day | ORAL | 0 refills | Status: DC
Start: 2016-07-03 — End: 2016-09-14

## 2016-07-03 MED ORDER — VENLAFAXINE HCL ER 150 MG PO CP24: ORAL_CAPSULE | ORAL | 2 refills | Status: DC

## 2016-07-03 MED ORDER — RISPERIDONE 1 MG PO TABS: 1.0000 mg | ORAL_TABLET | Freq: Every day | ORAL | 0 refills | Status: DC

## 2016-07-03 MED ORDER — BUSPIRONE HCL 7.5 MG PO TABS: 7.5000 mg | ORAL_TABLET | Freq: Every day | ORAL | 1 refills | Status: DC | PRN
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
## 2016-08-01 ENCOUNTER — Other Ambulatory Visit (HOSPITAL_COMMUNITY): Payer: Self-pay | Admitting: Psychiatry

## 2016-08-03 ENCOUNTER — Encounter: Payer: Self-pay | Admitting: Osteopathic Medicine

## 2016-08-03 ENCOUNTER — Ambulatory Visit (INDEPENDENT_AMBULATORY_CARE_PROVIDER_SITE_OTHER): Payer: BLUE CROSS/BLUE SHIELD | Admitting: Osteopathic Medicine

## 2016-08-03 VITALS — BP 129/71 | HR 62 | Wt 188.0 lb

## 2016-08-03 DIAGNOSIS — E118 Type 2 diabetes mellitus with unspecified complications: Secondary | ICD-10-CM | POA: Diagnosis not present

## 2016-08-03 DIAGNOSIS — Z794 Long term (current) use of insulin: Secondary | ICD-10-CM

## 2016-08-03 LAB — POCT GLYCOSYLATED HEMOGLOBIN (HGB A1C): HEMOGLOBIN A1C: 7.1

## 2016-08-03 NOTE — Addendum Note (Signed)
Addended by: Doree Albee on: 08/03/2016 08:34 AM   Modules accepted: Orders

## 2016-08-03 NOTE — Progress Notes (Signed)
HPI: Sara Leon is a 51 y.o. female  who presents to [facility] today, 08/03/16,  for chief complaint of:  Chief Complaint  Patient presents with  . Diabetes   Diabetes: A1c stable since 5 months ago. Patient on 14 units daily of long-acting insulin and 500 twice a day of metformin. Has been making effort as far as diet/exercise and has lost a bit of weight since last visit.   Past medical history, surgical history, social history and family history reviewed.  Patient Active Problem List   Diagnosis Date Noted  . Greater trochanteric bursitis of both hips 05/04/2016  . High risk medication use 02/23/2016  . DJD (degenerative joint disease), ankle and foot 10/13/2015  . Right foot pain 10/03/2015  . Metatarsalgia of right foot 10/03/2015  . Lumbar radiculopathy, acute 05/30/2015  . Acute bronchitis 05/23/2015  . Solitary pulmonary nodule 03/31/2014  . S/P repair of ventral hernia 01/21/2014  . Type 2 diabetes mellitus ([place]) 08/07/2013  . Essential hypertension   . Hyperlipidemia 05/02/2011  . Depression with anxiety 05/02/2011  . Chronic pancreatitis ([place]) 10/16/2010    Class: Question of  . Pancreatic insufficiency 10/16/2010    Current medication list and allergy/intolerance information reviewed.   Current Outpatient Prescriptions on File Prior to Visit  Medication Sig Dispense Refill  . AMBULATORY NON FORMULARY MEDICATION Diabetic testing strips: OneTouch Verio: Use to check blood sugar up to three times a day. 50 Units 11  . atenolol (TENORMIN) 25 MG tablet Take 2 tablets (50 mg total) by mouth daily. 60 tablet 3  . busPIRone (BUSPAR) 7.5 MG tablet Take 1 tablet (7.5 mg total) by mouth daily as needed. 30 tablet 1  . clonazePAM (KLONOPIN) 0.5 MG tablet Take 1 tablet (0.5 mg total) by mouth at bedtime. 30 tablet 1  . insulin degludec (TRESIBA FLEXTOUCH) 100 UNIT/ML SOPN FlexTouch Pen Inject 0.14 mLs (14 Units total) into the skin  daily. 3 pen 3  . Insulin Pen Needle (B-D UF III MINI PEN NEEDLES) 31G X 5 MM MISC Use to inject insulin daily. 50 each 11  . Lidocaine HCl 2 % SOLN Use as directed 10-15 mLs in the mouth or throat every 3 (three) hours as needed (mouth/throat pain). 100 mL 0  . metFORMIN (GLUCOPHAGE) 1000 MG tablet Take 0.5 tablets (500 mg total) by mouth 2 (two) times daily with a meal. One by mouth twice a day for blood sugar control. 90 tablet 1  . prazosin (MINIPRESS) 1 MG capsule Take 1 capsule (1 mg total) by mouth at bedtime. 90 capsule 0  . risperiDONE (RISPERDAL) 1 MG tablet Take 1 tablet (1 mg total) by mouth at bedtime. 90 tablet 0  . venlafaxine XR (EFFEXOR-XR) 150 MG 24 hr capsule TAKE 2 CAPSULES BY MOUTH EVERY DAY WITH BREAKFAST 60 capsule 2  . [DISCONTINUED] ALPRAZolam (XANAX) 1 MG tablet Take 1 tablet (1 mg total) by mouth 2 (two) times daily as needed for anxiety. NEED FOLLOW UP APPOINTMENT FOR MORE REFILLS 60 tablet 0  . [DISCONTINUED] ARIPiprazole (ABILIFY) 10 MG tablet Take 1 tablet (10 mg total) by mouth daily. 90 tablet 0   No current facility-administered medications on file prior to visit.    Allergies  Allergen Reactions  . Montelukast Cough  . Quinapril Cough     A1C = 7.1% today   Review of Systems:  Constitutional: No recent illness  HEENT: No  headache, no vision change  Cardiac: No  chest  pain, No  pressure, No palpitations  Respiratory:  No  shortness of breath. No  Cough  Psychiatric: No  concerns with depression, No  concerns with anxiety  Exam:  BP 129/71   Pulse 62   Wt 188 lb (85.3 kg)   BMI 31.28 kg/m   Constitutional: VS see above. General Appearance: alert, well-developed, well-nourished, NAD  Eyes: Normal lids and conjunctive, non-icteric sclera  Ears, Nose, Mouth, Throat: MMM, Normal external inspection ears/nares/mouth/lips/gums.  Neck: No masses, trachea midline.   Respiratory: Normal respiratory effort. no wheeze, no rhonchi, no  rales  Cardiovascular: S1/S2 normal, no murmur, no rub/gallop auscultated. RRR.   Musculoskeletal: Gait normal. Symmetric and independent movement of all extremities  Neurological: Normal balance/coordination. No tremor.  Skin: warm, dry, intact.   Psychiatric: Normal judgment/insight. Normal mood and affect. Oriented x3.     ASSESSMENT/PLAN:   Patient would like to trial intensive lifestyle changes prior to increasing dosage of any medications. I believe this is reasonable and we can follow-up on A1c in another 3 months.  Plan for annual physical at next visit with brief review of diabetes care   Type 2 diabetes mellitus with complication, with long-term current use of insulin ([place])    Follow-up plan: Return in about 3 months (around 11/03/2016) for [place].  Visit summary with medication list and pertinent instructions was printed for patient to review, alert [place] if any changes needed. All questions at time of visit were answered - patient instructed to contact office with any additional concerns. ER/RTC precautions were reviewed with the patient and understanding verbalized.

## 2016-08-08 NOTE — Telephone Encounter (Signed)
Received fax from [facility] requesting a refill for Buspar. Per Dr. De Nurse, refill is authorize for Buspar 7.5mg , #45. Rx was sent to pharmacy. Pt's next apt is schedule on 09/13/16. Called and informed pt of refill status. Pt verbalizes understanding.

## 2016-08-21 ENCOUNTER — Other Ambulatory Visit: Payer: Self-pay

## 2016-08-21 DIAGNOSIS — E118 Type 2 diabetes mellitus with unspecified complications: Secondary | ICD-10-CM

## 2016-08-21 MED ORDER — METFORMIN HCL 1000 MG PO TABS: 500.0000 mg | ORAL_TABLET | Freq: Two times a day (BID) | ORAL | 1 refills | Status: DC

## 2016-08-23 ENCOUNTER — Other Ambulatory Visit: Payer: Self-pay | Admitting: Osteopathic Medicine

## 2016-08-23 DIAGNOSIS — E118 Type 2 diabetes mellitus with unspecified complications: Secondary | ICD-10-CM

## 2016-08-23 MED ORDER — METFORMIN HCL 1000 MG PO TABS: 500.0000 mg | ORAL_TABLET | Freq: Two times a day (BID) | ORAL | 1 refills | Status: DC

## 2016-08-23 NOTE — Progress Notes (Signed)
Corrected metformin instructions to fax

## 2016-08-31 ENCOUNTER — Ambulatory Visit: Payer: BLUE CROSS/BLUE SHIELD | Admitting: Sports Medicine

## 2016-09-03 ENCOUNTER — Other Ambulatory Visit (HOSPITAL_COMMUNITY): Payer: Self-pay | Admitting: Psychiatry

## 2016-09-04 NOTE — Telephone Encounter (Signed)
Medication refill- received fax from [facility] requesting a refill for Buspar. Per Dr. De Nurse, refill is authorize for Buspar 7.5mg , #45. Refill was sent to pharmacy. Called and informed pt of refill statis. Pt verbalizes understanding. Pt's next apt is schedule on 09/13/16.

## 2016-09-08 ENCOUNTER — Other Ambulatory Visit (HOSPITAL_COMMUNITY): Payer: Self-pay | Admitting: Psychiatry

## 2016-09-10 MED ORDER — CLONAZEPAM 0.5 MG PO TABS: 0.5000 mg | ORAL_TABLET | Freq: Every day | ORAL | 0 refills | Status: DC

## 2016-09-10 NOTE — Telephone Encounter (Signed)
Received fax from [facility] requesting a refill for Klonopin. Per De Nurse, refill is authorize for Klonopin 0.5mg , #30. Verbal order was given to [facility] (pharmacist). Pt's next apt is schedule on 09/13/16. Called and informed pt of refill status. Pt verbalizes understanding.

## 2016-09-13 ENCOUNTER — Ambulatory Visit (HOSPITAL_COMMUNITY): Payer: Self-pay | Admitting: Psychiatry

## 2016-09-14 ENCOUNTER — Encounter (HOSPITAL_COMMUNITY): Payer: Self-pay | Admitting: Psychiatry

## 2016-09-14 ENCOUNTER — Ambulatory Visit (INDEPENDENT_AMBULATORY_CARE_PROVIDER_SITE_OTHER): Payer: BLUE CROSS/BLUE SHIELD | Admitting: Psychiatry

## 2016-09-14 DIAGNOSIS — F4310 Post-traumatic stress disorder, unspecified: Secondary | ICD-10-CM

## 2016-09-14 DIAGNOSIS — F331 Major depressive disorder, recurrent, moderate: Secondary | ICD-10-CM

## 2016-09-14 DIAGNOSIS — F1390 Sedative, hypnotic, or anxiolytic use, unspecified, uncomplicated: Secondary | ICD-10-CM

## 2016-09-14 DIAGNOSIS — F418 Other specified anxiety disorders: Secondary | ICD-10-CM

## 2016-09-14 DIAGNOSIS — Z79899 Other long term (current) drug therapy: Secondary | ICD-10-CM

## 2016-09-14 DIAGNOSIS — F17210 Nicotine dependence, cigarettes, uncomplicated: Secondary | ICD-10-CM

## 2016-09-14 DIAGNOSIS — Z818 Family history of other mental and behavioral disorders: Secondary | ICD-10-CM

## 2016-09-14 DIAGNOSIS — Z0289 Encounter for other administrative examinations: Secondary | ICD-10-CM

## 2016-09-14 DIAGNOSIS — F411 Generalized anxiety disorder: Secondary | ICD-10-CM | POA: Diagnosis not present

## 2016-09-14 MED ORDER — VENLAFAXINE HCL ER 150 MG PO CP24: ORAL_CAPSULE | ORAL | 3 refills | Status: DC

## 2016-09-14 MED ORDER — PRAZOSIN HCL 1 MG PO CAPS: 1.0000 mg | ORAL_CAPSULE | Freq: Every day | ORAL | 0 refills | Status: DC

## 2016-09-14 MED ORDER — CLONAZEPAM 0.5 MG PO TABS: 0.5000 mg | ORAL_TABLET | Freq: Every day | ORAL | 3 refills | Status: DC

## 2016-09-14 MED ORDER — RISPERIDONE 1 MG PO TABS
1.0000 mg | ORAL_TABLET | Freq: Every day | ORAL | 0 refills | Status: DC
Start: 2016-09-14 — End: 2017-01-11

## 2016-09-14 MED ORDER — BUSPIRONE HCL 7.5 MG PO TABS: 7.5000 mg | ORAL_TABLET | Freq: Two times a day (BID) | ORAL | 2 refills | Status: DC | PRN
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
## 2016-09-21 IMAGING — MR MR LUMBAR SPINE W/O CM
4 of 5 series · 24 of 48 positions shown · non-contrast
Comparison: CT Abdomen and Pelvis 04/22/2009.

CLINICAL DATA: 49-year-old female with severe lumbar back pain
radiating to both lower extremities with numbness tingling and
burning. No known injury. Initial encounter.

EXAM:
MRI LUMBAR SPINE WITHOUT CONTRAST
TECHNIQUE: Multiplanar, multisequence MR imaging of the lumbar spine was
performed. No intravenous contrast was administered.

[Series 2: T2 · sagittal · 4.0mm · 0.81mm/px · 6 of 15 slices shown (1 of 2)]
[im 1/15]
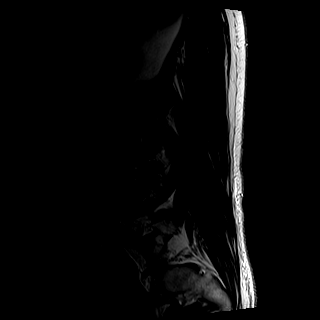
[im 3/15]
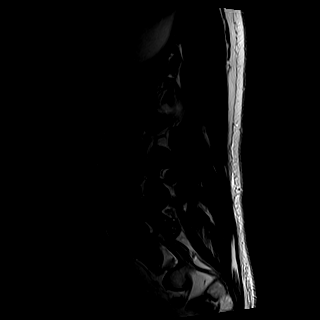
[im 6/15]
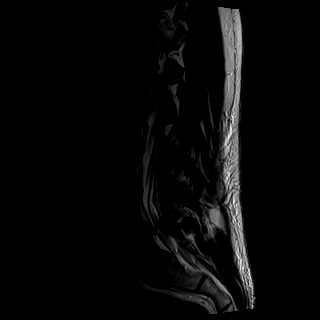
[im 9/15]
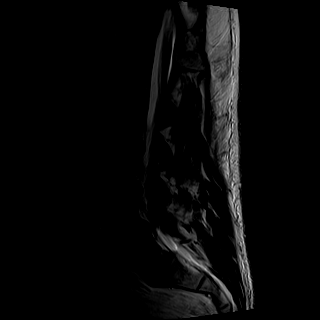
[im 12/15]
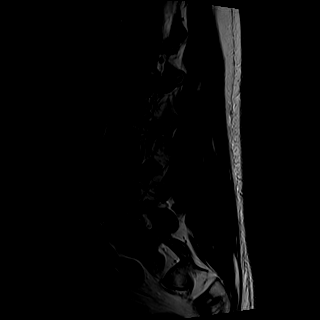
[im 15/15]
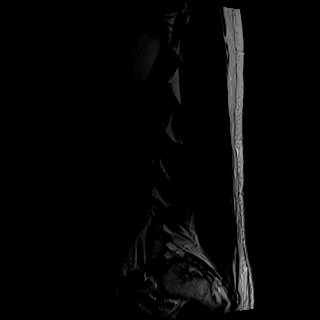

[Series 3: T1 · sagittal · 4.0mm · 0.41mm/px · 6 of 15 slices shown (1 of 2)]
[im 1/15]
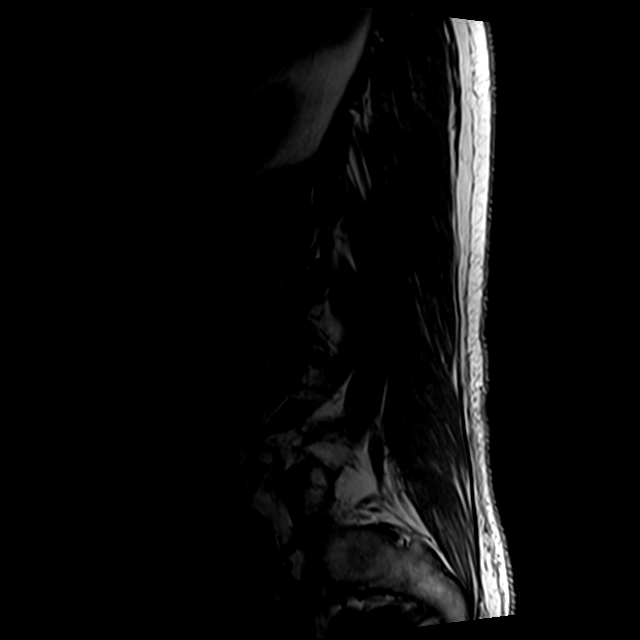
[im 3/15]
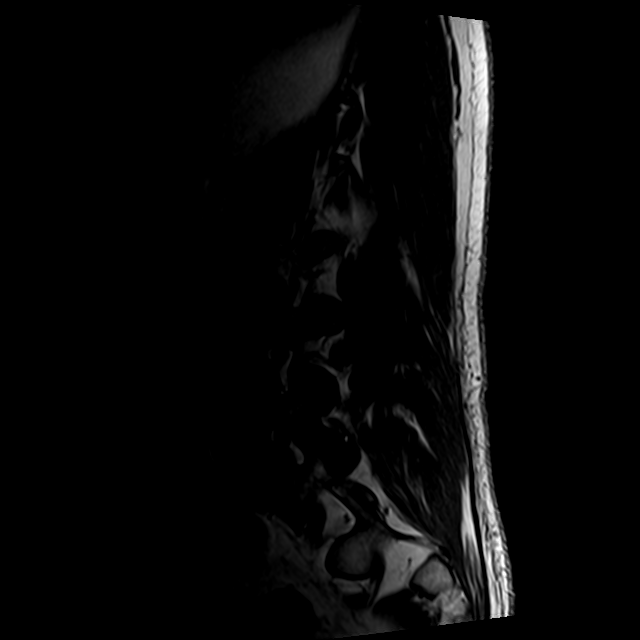
[im 6/15]
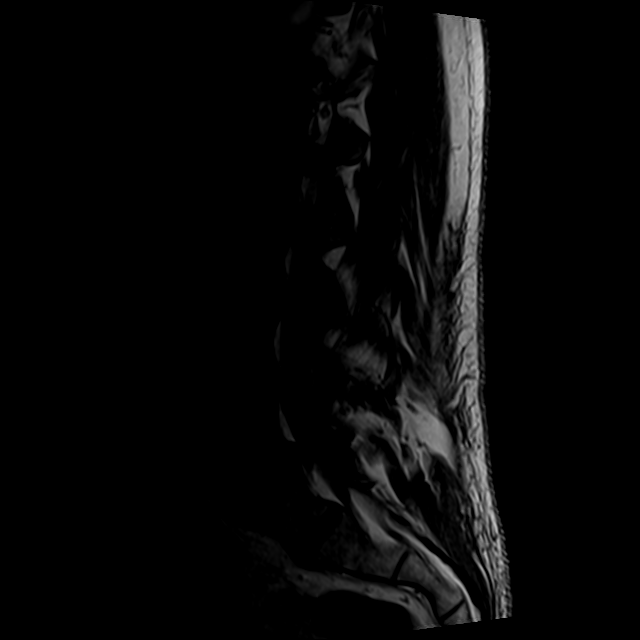
[im 9/15]
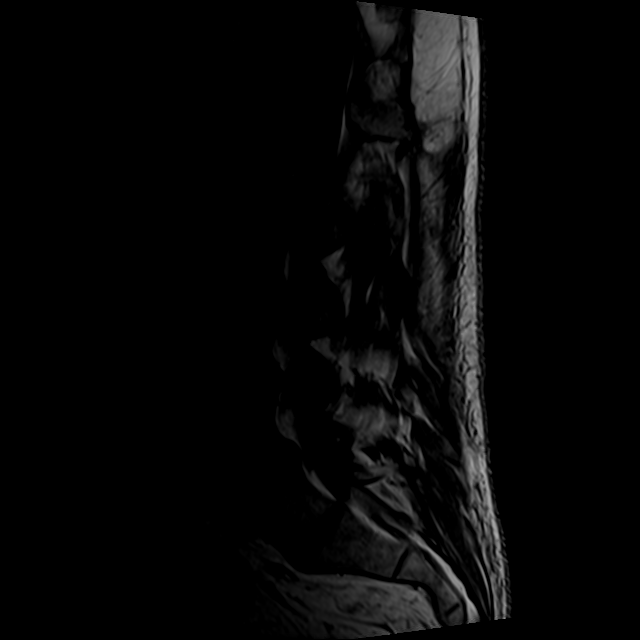
[im 12/15]
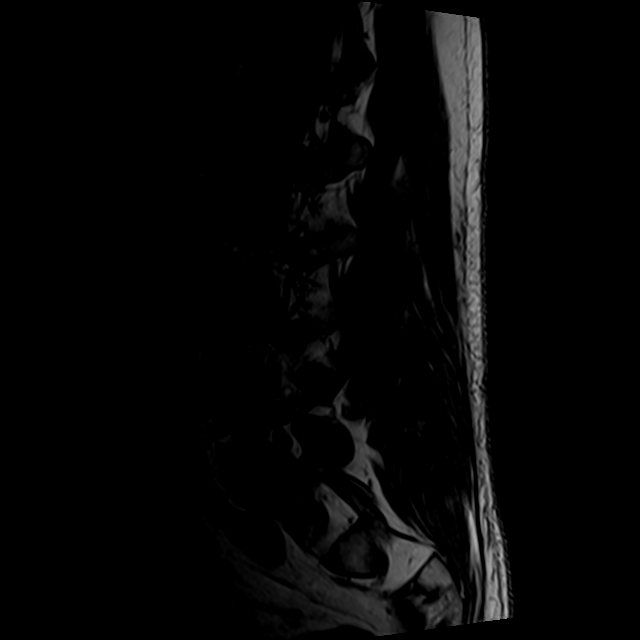
[im 15/15]
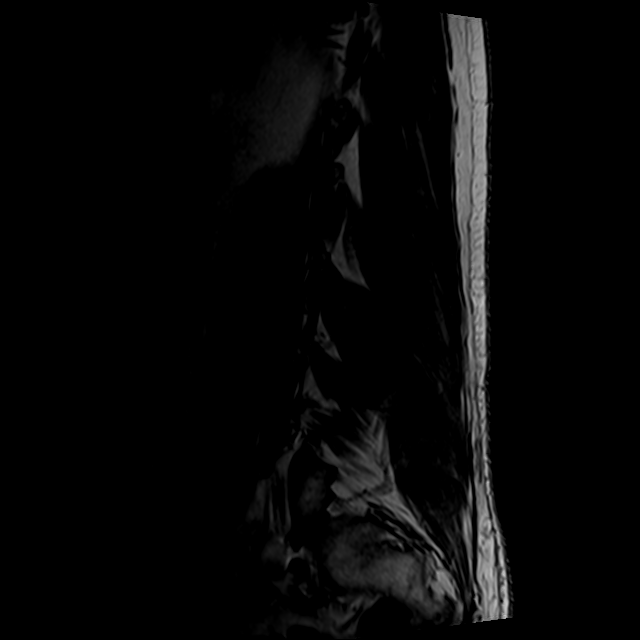

[Series 5: T2 · axial · 4.0mm · 0.78mm/px · z∈[-205,+23]mm · 9 of 39 slices shown (2 of 2)]
[im 1/39]
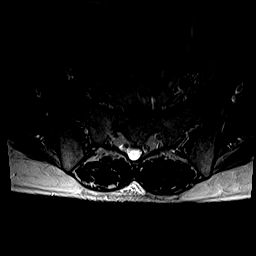
[im 6/39]
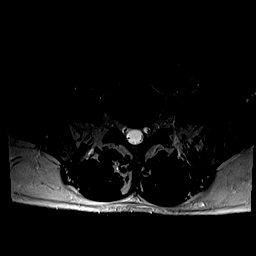
[im 11/39]
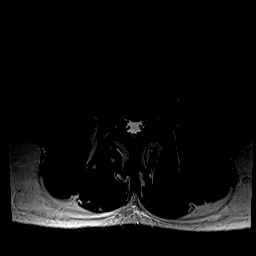
[im 17/39]
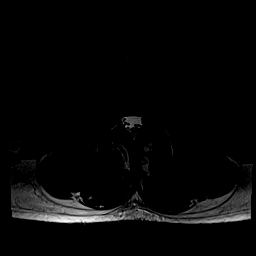
[im 20/39]
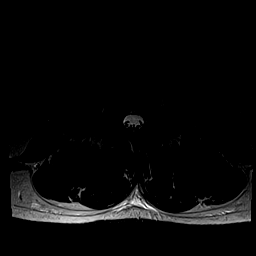
[im 22/39]
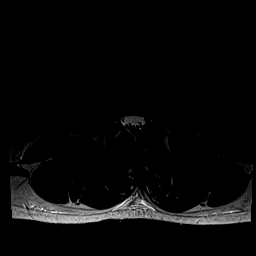
[im 28/39]
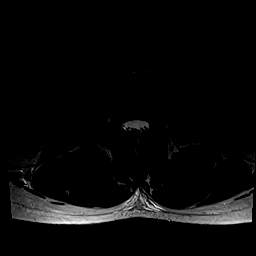
[im 33/39]
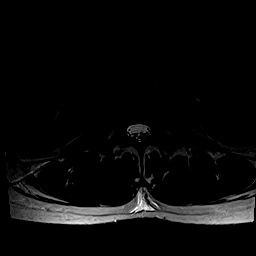
[im 39/39]
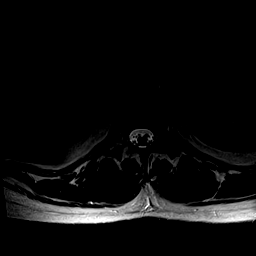

[Series 6: T1 · axial · 4.0mm · 0.31mm/px · z∈[-182,-7]mm · 3 of 39 slices shown (2 of 2)]
[im 6/39]
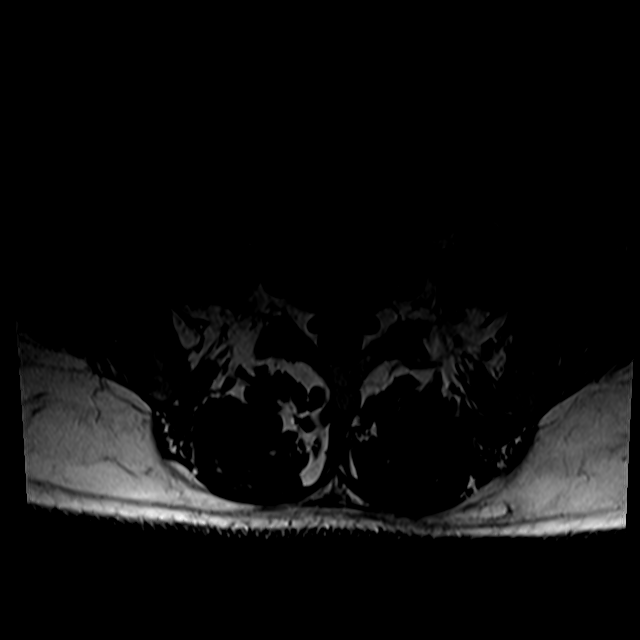
[im 20/39]
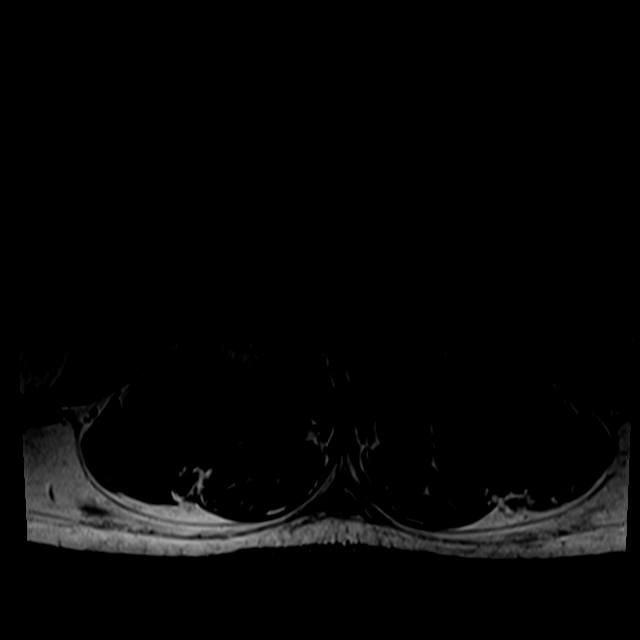
[im 33/39]
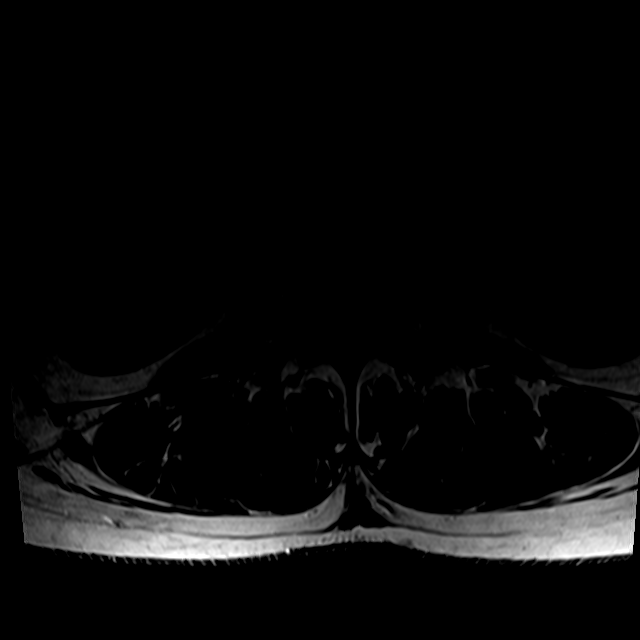

[24 of 48 positions shown; findings below may reference images not displayed]

FINDINGS: Transitional lumbosacral anatomy demonstrated on the comparison with
partially sacralized L5 level. Bilateral L5-S1 assimilation joints.
Hypoplastic ribs at T12 and full size ribs at T11 by this numbering
system. Stable vertebral height and alignment since 8919. No marrow
edema or evidence of acute osseous abnormality.

Visualized lower thoracic spinal cord is normal with conus medularis
at L1.

Visualized abdominal viscera and paraspinal soft tissues are within
normal limits. There is mild dependent subcutaneous edema.

T11-T12:  Negative.

T12-L1:  Negative.

L1-L2:  Negative.

L2-L3: Mild disc desiccation and circumferential disc bulge. Mild
facet hypertrophy. No significant stenosis.

L3-L4: Mild disc desiccation and circumferential disc bulge. Mild to
moderate facet hypertrophy. Trace facet joint fluid. No spinal or
lateral recess stenosis. Borderline to mild left L3 foraminal
stenosis.

L4-L5: Disc desiccation with mild disc bulge. Superimposed
broad-based central disc extrusion best seen on series 2, image 7
and series 5, image 29. Chronic moderate to severe facet hypertrophy
greater on the right. Disc material abutting the descending right L5
nerve roots in the lateral recess (series 5, image 30). No spinal or
convincing left lateral recess stenosis. No foraminal stenosis.

L5-S1: Transitional anatomy. Small normal disc. Mildly sclerotic
bilateral assimilation joints. No stenosis.
IMPRESSION: 1. Transitional lumbosacral anatomy. Correlation with radiographs is
recommended prior to any operative intervention.
2. Disc degeneration maximal at L4-L5 where a small disc herniation
appears to most affect the descending right L5 nerve roots in the
lateral recess.
3. Moderate to severe facet degeneration at L3-L4 and L4-L5. Up to
mild multifactorial neural foraminal stenosis.

## 2016-09-28 ENCOUNTER — Other Ambulatory Visit (HOSPITAL_COMMUNITY): Payer: Self-pay | Admitting: Psychiatry

## 2016-10-03 NOTE — Telephone Encounter (Signed)
Received fax from [facility] requesting a refill for Buspar. Per Dr. De Nurse, refill request is denied. Refill was sent electronically to [facility] on 09/14/16 w/ 2 refills. Nothing further is need at this time.

## 2016-10-04 ENCOUNTER — Other Ambulatory Visit: Payer: Self-pay | Admitting: Osteopathic Medicine

## 2016-10-19 ENCOUNTER — Other Ambulatory Visit (HOSPITAL_COMMUNITY): Payer: Self-pay | Admitting: Psychiatry

## 2016-10-22 NOTE — Telephone Encounter (Signed)
Received fax from [facility] requesting a refill for Buspar. Per Dr. De Nurse, refill request is denied. Refill for was sent to pharmacy on 09/14/16 w/ 2 refills. Patient's next apt is schedule on 11/09/16.  Nothing further is need at this time.

## 2016-11-09 ENCOUNTER — Ambulatory Visit (INDEPENDENT_AMBULATORY_CARE_PROVIDER_SITE_OTHER): Payer: BLUE CROSS/BLUE SHIELD | Admitting: Osteopathic Medicine

## 2016-11-09 ENCOUNTER — Ambulatory Visit (INDEPENDENT_AMBULATORY_CARE_PROVIDER_SITE_OTHER): Payer: BLUE CROSS/BLUE SHIELD

## 2016-11-09 ENCOUNTER — Other Ambulatory Visit: Payer: Self-pay

## 2016-11-09 ENCOUNTER — Encounter: Payer: Self-pay | Admitting: Osteopathic Medicine

## 2016-11-09 VITALS — BP 114/73 | HR 59 | Ht 65.0 in | Wt 184.7 lb

## 2016-11-09 DIAGNOSIS — R1013 Epigastric pain: Secondary | ICD-10-CM | POA: Diagnosis not present

## 2016-11-09 DIAGNOSIS — I10 Essential (primary) hypertension: Secondary | ICD-10-CM

## 2016-11-09 DIAGNOSIS — E118 Type 2 diabetes mellitus with unspecified complications: Secondary | ICD-10-CM

## 2016-11-09 DIAGNOSIS — R112 Nausea with vomiting, unspecified: Secondary | ICD-10-CM

## 2016-11-09 DIAGNOSIS — Z1211 Encounter for screening for malignant neoplasm of colon: Secondary | ICD-10-CM

## 2016-11-09 DIAGNOSIS — R197 Diarrhea, unspecified: Secondary | ICD-10-CM

## 2016-11-09 DIAGNOSIS — Z0000 Encounter for general adult medical examination without abnormal findings: Secondary | ICD-10-CM | POA: Diagnosis not present

## 2016-11-09 DIAGNOSIS — K760 Fatty (change of) liver, not elsewhere classified: Secondary | ICD-10-CM | POA: Diagnosis not present

## 2016-11-09 DIAGNOSIS — Z1239 Encounter for other screening for malignant neoplasm of breast: Secondary | ICD-10-CM

## 2016-11-09 DIAGNOSIS — K861 Other chronic pancreatitis: Secondary | ICD-10-CM

## 2016-11-09 DIAGNOSIS — Z794 Long term (current) use of insulin: Secondary | ICD-10-CM | POA: Diagnosis not present

## 2016-11-09 DIAGNOSIS — Z1231 Encounter for screening mammogram for malignant neoplasm of breast: Secondary | ICD-10-CM

## 2016-11-09 LAB — CBC WITH DIFFERENTIAL/PLATELET
BASOS PCT: 1 %
Basophils Absolute: 96 cells/uL (ref 0–200)
EOS ABS: 576 {cells}/uL — AB (ref 15–500)
Eosinophils Relative: 6 %
HCT: 46.3 % — ABNORMAL HIGH (ref 35.0–45.0)
Hemoglobin: 15.5 g/dL (ref 11.7–15.5)
LYMPHS PCT: 30 %
Lymphs Abs: 2880 cells/uL (ref 850–3900)
MCH: 31 pg (ref 27.0–33.0)
MCHC: 33.5 g/dL (ref 32.0–36.0)
MCV: 92.6 fL (ref 80.0–100.0)
MPV: 11.4 fL (ref 7.5–12.5)
Monocytes Absolute: 672 cells/uL (ref 200–950)
Monocytes Relative: 7 %
Neutro Abs: 5376 cells/uL (ref 1500–7800)
Neutrophils Relative %: 56 %
PLATELETS: 245 10*3/uL (ref 140–400)
RBC: 5 MIL/uL (ref 3.80–5.10)
RDW: 14.5 % (ref 11.0–15.0)
WBC: 9.6 10*3/uL (ref 3.8–10.8)

## 2016-11-09 LAB — AMYLASE: Amylase: 22 U/L (ref 21–101)

## 2016-11-09 LAB — POCT UA - MICROALBUMIN
CREATININE, POC: 100 mg/dL
Microalbumin Ur, POC: 10 mg/L

## 2016-11-09 LAB — COMPLETE METABOLIC PANEL WITH GFR
ALT: 42 U/L — AB (ref 6–29)
AST: 37 U/L — ABNORMAL HIGH (ref 10–35)
Albumin: 4.1 g/dL (ref 3.6–5.1)
Alkaline Phosphatase: 102 U/L (ref 33–130)
BUN: 7 mg/dL (ref 7–25)
CHLORIDE: 104 mmol/L (ref 98–110)
CO2: 24 mmol/L (ref 20–31)
CREATININE: 0.77 mg/dL (ref 0.50–1.05)
Calcium: 9.2 mg/dL (ref 8.6–10.4)
GFR, Est Non African American: 89 mL/min (ref >=60)
Glucose, Bld: 126 mg/dL — ABNORMAL HIGH (ref 65–99)
POTASSIUM: 4.6 mmol/L (ref 3.5–5.3)
Sodium: 135 mmol/L (ref 135–146)
Total Bilirubin: 0.5 mg/dL (ref 0.2–1.2)
Total Protein: 7.2 g/dL (ref 6.1–8.1)

## 2016-11-09 LAB — LIPASE: LIPASE: 23 U/L (ref 7–60)

## 2016-11-09 LAB — TSH: TSH: 1.6 m[IU]/L

## 2016-11-09 LAB — GAMMA GT: GGT: 107 U/L — AB (ref 3–70)

## 2016-11-09 LAB — POCT GLYCOSYLATED HEMOGLOBIN (HGB A1C): Hemoglobin A1C: 6.4

## 2016-11-09 MED ORDER — IOPAMIDOL (ISOVUE-300) INJECTION 61%
100.0000 mL | Freq: Once | INTRAVENOUS | Status: AC | PRN
  Administered 2016-11-09: 100 mL via INTRAVENOUS

## 2016-11-09 MED ORDER — PANTOPRAZOLE SODIUM 40 MG PO TBEC: 40.0000 mg | DELAYED_RELEASE_TABLET | Freq: Two times a day (BID) | ORAL | 0 refills | Status: DC

## 2016-11-09 MED ORDER — DIPHENOXYLATE-ATROPINE 2.5-0.025 MG PO TABS: 1.0000 | ORAL_TABLET | Freq: Four times a day (QID) | ORAL | 3 refills | Status: DC | PRN

## 2016-11-09 MED ORDER — ONDANSETRON 4 MG PO TBDP: 8.0000 mg | ORAL_TABLET | Freq: Three times a day (TID) | ORAL | 2 refills | Status: DC | PRN

## 2016-11-09 NOTE — Addendum Note (Signed)
Addended by: Maryla Morrow on: 11/09/2016 04:29 PM   Modules accepted: Orders

## 2016-11-09 NOTE — Patient Instructions (Signed)
I suspect we might have a pancreatitis on our hands - let's get a CT scan and some labs to figure out if that's the case or if something else is going on.   If your pain worsens, if you start to pass black/bloody stool or if you throw up anything that looks like blood or coffee grounds or any other concerns, please seek emergency care!  In the meantime, I've sent some medicine for nausea and diarrhea, and see below for diet instructions. Clear liquid until feeling better then slowly progress to full liquid then bland diets.   If labs/scan are concerning for severe disease, we may need to send you to the hospital - we will be in touch as soon as I know more from the results.    Clear Liquid Diet, Adult A clear liquid diet is a diet that includes only liquids that you can see through. You may need to follow a clear liquid diet if:  You develop a medical condition right before or after you have surgery.  You were not able to eat food for a long period of time.  You had a condition that gave you diarrhea.  You are going to have an exam, such as a colonoscopy, in which instruments will be put into your body to look at parts of your digestive system.  You are going to have bowel surgery.  The usual goals of this diet are:  To rest the stomach and digestive system as much as possible.  To keep you hydrated.  To make sure you get some calories for energy.  To help you return to normal digestion.  Most people need to follow this diet for only a short period of time. What do I need to know about this diet?  A clear liquid is a liquid that you can see through when you hold it up to a light.  A clear liquid diet does not provide all the nutrients that you need. It is important to choose a variety of the liquids that are allowed on this diet. That way, you will get as many nutrients as possible.  If you are not sure whether you can have certain items, ask your health care provider. What  can I have?  Water and flavored water.  Fruit juices that do not have pulp, such as cranberry juice and apple juice.  Tea and coffee without milk or cream.  Clear bouillon or broth.  Broth-based soups that have been strained.  Flavored gelatins.  Honey.  Sugar water.  Frozen ice or frozen ice pops that do not contain milk, yogurt, fruit pieces, or fruit pulp.  Clear sodas.  Clear sports drinks. The items listed above may not be a complete list of recommended liquids. Contact your dietitian for more options. What can I not have?  Juices that have pulp.  Milk.  Cream or cream-based soups.  Yogurt. The items listed above may not be a complete list of liquids to avoid. Contact your dietitian for more information. Summary  A clear liquid diet is a diet that includes only liquids that you can see through.  The goal of this diet is to help you recover by resting your digestive system, keeping you hydrated, and providing nutrients.  Make sure to avoid liquids with milk, cream, or pulp while on this diet. This information is not intended to replace advice given to you by your health care provider. Make sure you discuss any questions you have with your health  care provider. Document Released: 04/30/2005 Document Revised: 12/13/2015 Document Reviewed: 03/27/2013 Elsevier Interactive Patient Education  2018 [employer].   Full Liquid Diet A full liquid diet may be used:  To help you transition from a clear liquid diet to a soft diet.  When your body is healing and can only tolerate foods that are easy to digest.  Before or after certain a procedure, test, or surgery (such as stomach or intestinal surgeries).  If you have trouble swallowing or chewing.  A full liquid diet includes fluids and foods that are liquid or will become liquid at room temperature. The full liquid diet gives you the proteins, fluids, salts, and minerals that you need for energy. If you continue  this diet for more than 72 hours, talk to your health care provider about how many calories you need to consume. If you continue the diet for more than 5 days, talk to your health care provider about taking a multivitamin or a nutritional supplement. What do I need to know about a full liquid diet?  You may have any liquid.  You may have any food that becomes a liquid at room temperature. The food is considered a liquid if it can be poured off a spoon at room temperature.  Drink one serving of citrus or vitamin C-enriched fruit juice daily. What foods can I eat? Grains Any grain food that can be pureed in soup (such as crackers, pasta, and rice). Hot cereal (such as farina or oatmeal) that has been blended. Talk to your health care provider or dietitian about these foods. Vegetables Pulp-free tomato or vegetable juice. Vegetables pureed in soup. Fruits Fruit juice, including nectars and juices with pulp. Meats and Other Protein Sources Eggs in custard, eggnog mix, and eggs used in ice cream or pudding. Strained meats, like in baby food, may be allowed. Consult your health care provider. Dairy Milk and milk-based beverages, including milk shakes and instant breakfast mixes. Smooth yogurt. Pureed cottage cheese. Avoid these foods if they are not well tolerated. Beverages All beverages, including liquid nutritional supplements. Ask your health care provider if you can have carbonated beverages. They may not be well tolerated. Condiments Iodized salt, pepper, spices, and flavorings. Cocoa powder. Vinegar, ketchup, yellow mustard, smooth sauces (such as hollandaise, cheese sauce, or white sauce), and soy sauce. Sweets and Desserts Custard, smooth pudding. Flavored gelatin. Tapioca, junket. Plain ice cream, sherbet, fruit ices. Frozen ice pops, frozen fudge pops, pudding pops, and other frozen bars with cream. Syrups, including chocolate syrup. Sugar, honey, jelly. Fats and Oils Margarine,  butter, cream, sour cream, and oils. Other Broth and cream soups. Strained, broth-based soups. The items listed above may not be a complete list of recommended foods or beverages. Contact your dietitian for more options. What foods can I not eat? Grains All breads. Grains are not allowed unless they are pureed into soup. Vegetables Vegetables are not allowed unless they are juiced, or cooked and pureed into soup. Fruits Fruits are not allowed unless they are juiced. Meats and Other Protein Sources Any meat or fish. Cooked or raw eggs. Nut butters. Dairy Cheese. Condiments Stone ground mustards. Fats and Oils Fats that are coarse or chunky. Sweets and Desserts Ice cream or other frozen desserts that have any solids in them or on top, such as nuts, chocolate chips, and pieces of cookies. Cakes. Cookies. Candy. Others Soups with chunks or pieces in them. The items listed above may not be a complete list of foods and  beverages to avoid. Contact your dietitian for more information. This information is not intended to replace advice given to you by your health care provider. Make sure you discuss any questions you have with your health care provider. Document Released: 04/30/2005 Document Revised: 10/06/2015 Document Reviewed: 03/05/2013 Elsevier Interactive Patient Education  2017 [place] Diet A bland diet consists of foods that do not have a lot of fat or fiber. Foods without fat or fiber are easier for the body to digest. They are also less likely to irritate your mouth, throat, stomach, and other parts of your gastrointestinal tract. A bland diet is sometimes called a BRAT diet. What is my plan? Your health care provider or dietitian may recommend specific changes to your diet to prevent and treat your symptoms, such as:  Eating small meals often.  Cooking food until it is soft enough to chew easily.  Chewing your food well.  Drinking fluids slowly.  Not eating  foods that are very spicy, sour, or fatty.  Not eating citrus fruits, such as oranges and grapefruit.  What do I need to know about this diet?  Eat a variety of foods from the bland diet food list.  Do not follow a bland diet longer than you have to.  Ask your health care provider whether you should take vitamins. What foods can I eat? Grains  Hot cereals, such as cream of wheat. Bread, crackers, or tortillas made from refined white flour. Rice. Vegetables Canned or cooked vegetables. Mashed or boiled potatoes. Fruits Bananas. Applesauce. Other types of cooked or canned fruit with the skin and seeds removed, such as canned peaches or pears. Meats and Other Protein Sources Scrambled eggs. Creamy peanut butter or other nut butters. Lean, well-cooked meats, such as chicken or fish. Tofu. Soups or broths. Dairy Low-fat dairy products, such as milk, cottage cheese, or yogurt. Beverages Water. Herbal tea. Apple juice. Sweets and Desserts Pudding. Custard. Fruit gelatin. Ice cream. Fats and Oils Mild salad dressings. Canola or olive oil. The items listed above may not be a complete list of allowed foods or beverages. Contact your dietitian for more options. What foods are not recommended? Foods and ingredients that are often not recommended include:  Spicy foods, such as hot sauce or salsa.  Fried foods.  Sour foods, such as pickled or fermented foods.  Raw vegetables or fruits, especially citrus or berries.  Caffeinated drinks.  Alcohol.  Strongly flavored seasonings or condiments.  The items listed above may not be a complete list of foods and beverages that are not allowed. Contact your dietitian for more information. This information is not intended to replace advice given to you by your health care provider. Make sure you discuss any questions you have with your health care provider. Document Released: 08/22/2015 Document Revised: 10/06/2015 Document Reviewed:  05/12/2014 Elsevier Interactive Patient Education  2018 [employer].

## 2016-11-09 NOTE — Progress Notes (Addendum)
HPI: Sara Leon is a 51 y.o. female  who presents to [place] today, 11/09/16,  for chief complaint of:  Chief Complaint  Patient presents with  . Annual Exam  . Diabetes  . Abdominal Pain     Patient initially here for preventive care visit, did not mention to the nurse that she has been experiencing abdominal pain . Context: History of pancreatitis and pancreatic biopsy, this feels similar. . Location: Epigastric, nonradiating . Quality: Cramping pain . Severity: To be getting worse over the past 2 weeks or so . Duration: 3 weeks of nausea, 2 weeks of abdominal pain . Timing: worse little while after eating . Assoc signs/symptoms: Nausea and vomiting. Loose stool several times per day.    Past medical history, surgical history, social history and family history reviewed.  Patient Active Problem List   Diagnosis Date Noted  . Greater trochanteric bursitis of both hips 05/04/2016  . High risk medication use 02/23/2016  . DJD (degenerative joint disease), ankle and foot 10/13/2015  . Right foot pain 10/03/2015  . Metatarsalgia of right foot 10/03/2015  . Lumbar radiculopathy, acute 05/30/2015  . Acute bronchitis 05/23/2015  . Solitary pulmonary nodule 03/31/2014  . S/P repair of ventral hernia 01/21/2014  . Type 2 diabetes mellitus ([place]) 08/07/2013  . Essential hypertension   . Hyperlipidemia 05/02/2011  . Depression with anxiety 05/02/2011  . Chronic pancreatitis ([place]) 10/16/2010    Class: Question of  . Pancreatic insufficiency 10/16/2010    Current medication list and allergy/intolerance information reviewed.   Current Outpatient Prescriptions on File Prior to Visit  Medication Sig Dispense Refill  . AMBULATORY NON FORMULARY MEDICATION Diabetic testing strips: OneTouch Verio: Use to check blood sugar up to three times a day. 50 Units 11  . atenolol (TENORMIN) 25 MG tablet TAKE 2 TABLETS BY MOUTH EVERY DAY *12/23* 60 tablet 3   . busPIRone (BUSPAR) 7.5 MG tablet Take 1 tablet (7.5 mg total) by mouth 2 (two) times daily as needed. Can take one a day instead of bid 30 tablet 2  . clonazePAM (KLONOPIN) 0.5 MG tablet Take 1 tablet (0.5 mg total) by mouth at bedtime. 30 tablet 3  . insulin degludec (TRESIBA FLEXTOUCH) 100 UNIT/ML SOPN FlexTouch Pen Inject 0.14 mLs (14 Units total) into the skin daily. 3 pen 3  . Insulin Pen Needle (B-D UF III MINI PEN NEEDLES) 31G X 5 MM MISC Use to inject insulin daily. 50 each 11  . metFORMIN (GLUCOPHAGE) 1000 MG tablet Take 0.5 tablets (500 mg total) by mouth 2 (two) times daily with a meal. 90 tablet 1  . prazosin (MINIPRESS) 1 MG capsule Take 1 capsule (1 mg total) by mouth at bedtime. 90 capsule 0  . risperiDONE (RISPERDAL) 1 MG tablet Take 1 tablet (1 mg total) by mouth at bedtime. 90 tablet 0  . venlafaxine XR (EFFEXOR-XR) 150 MG 24 hr capsule TAKE 2 CAPSULES BY MOUTH EVERY DAY WITH BREAKFAST 60 capsule 3  . [DISCONTINUED] ALPRAZolam (XANAX) 1 MG tablet Take 1 tablet (1 mg total) by mouth 2 (two) times daily as needed for anxiety. NEED FOLLOW UP APPOINTMENT FOR MORE REFILLS 60 tablet 0  . [DISCONTINUED] ARIPiprazole (ABILIFY) 10 MG tablet Take 1 tablet (10 mg total) by mouth daily. 90 tablet 0   No current facility-administered medications on file prior to visit.    Allergies  Allergen Reactions  . Montelukast Cough  . Quinapril Cough      Review  of Systems:  Constitutional: +recent illness, no fever/chills  HEENT: No  headache, no vision change  Cardiac: No  chest pain, No  pressure, No palpitations  Respiratory:  No  shortness of breath. No  Cough  Gastrointestinal: +abdominal pain, +change on bowel habits - see history of present illness  Musculoskeletal: No new myalgia/arthralgia  Skin: No  Rash  Neurologic: No  weakness, No  Dizziness   Exam:  BP 114/73   Pulse (!) 59   Ht 5' 5"  (1.517 m)   Wt 184 lb 11.2 oz (83.8 kg)   SpO2 98%   BMI 30.74 kg/m    Constitutional: VS see above. General Appearance: alert, well-developed, well-nourished, NAD  Eyes: Normal lids and conjunctive, non-icteric sclera  Ears, Nose, Mouth, Throat: MMM, Normal external inspection ears/nares/mouth/lips/gums.  Neck: No masses, trachea midline.   Respiratory: Normal respiratory effort. no wheeze, no rhonchi, no rales  Cardiovascular: S1/S2 normal, no murmur, no rub/gallop auscultated. RRR.   Abdominal: Tenderness to palpation epigastric region, no rebound/guarding, no distention. Relatively hyperactive bowel sounds all 4 quadrants  Musculoskeletal: Gait normal. Symmetric and independent movement of all extremities  Neurological: Normal balance/coordination. No tremor.  Skin: warm, dry, intact.   Psychiatric: Normal judgment/insight. Normal mood and affect. Oriented x3.    Recent Results (from the past 2160 hour(s))  POCT UA - Microalbumin     Status: None   Collection Time: 11/09/16  8:53 AM  Result Value Ref Range   Microalbumin Ur, POC 10 mg/L   Creatinine, POC 100 mg/dL   Albumin/Creatinine Ratio, Urine, POC <30   POCT glycosylated hemoglobin (Hb A1C)     Status: None   Collection Time: 11/09/16  8:55 AM  Result Value Ref Range   Hemoglobin A1C 6.4   CBC with Differential/Platelet     Status: Abnormal   Collection Time: 11/09/16  9:14 AM  Result Value Ref Range   WBC 9.6 3.8 - 10.8 K/uL   RBC 5.00 3.80 - 5.10 MIL/uL   Hemoglobin 15.5 11.7 - 15.5 g/dL   HCT 46.3 (H) 35.0 - 45.0 %   MCV 92.6 80.0 - 100.0 fL   MCH 31.0 27.0 - 33.0 pg   MCHC 33.5 32.0 - 36.0 g/dL   RDW 14.5 11.0 - 15.0 %   Platelets 245 140 - 400 K/uL   MPV 11.4 7.5 - 12.5 fL   Neutro Abs 5,376 1,500 - 7,800 cells/uL   Lymphs Abs 2,880 850 - 3,900 cells/uL   Monocytes Absolute 672 200 - 950 cells/uL   Eosinophils Absolute 576 (H) 15 - 500 cells/uL   Basophils Absolute 96 0 - 200 cells/uL   Neutrophils Relative % 56 %   Lymphocytes Relative 30 %   Monocytes Relative 7  %   Eosinophils Relative 6 %   Basophils Relative 1 %   Smear Review Criteria for review not met   COMPLETE METABOLIC PANEL WITH GFR     Status: Abnormal   Collection Time: 11/09/16  9:14 AM  Result Value Ref Range   Sodium 135 135 - 146 mmol/L   Potassium 4.6 3.5 - 5.3 mmol/L   Chloride 104 98 - 110 mmol/L   CO2 24 20 - 31 mmol/L   Glucose, Bld 126 (H) 65 - 99 mg/dL   BUN 7 7 - 25 mg/dL   Creat 0.77 0.50 - 1.05 mg/dL    Comment:   For patients > or = 53 years of age: The upper reference limit for Creatinine is  approximately 13% higher for people identified as African-American.      Total Bilirubin 0.5 0.2 - 1.2 mg/dL   Alkaline Phosphatase 102 33 - 130 U/L   AST 37 (H) 10 - 35 U/L   ALT 42 (H) 6 - 29 U/L   Total Protein 7.2 6.1 - 8.1 g/dL   Albumin 4.1 3.6 - 5.1 g/dL   Calcium 9.2 8.6 - 10.4 mg/dL   GFR, Est African American >89 >=60 mL/min   GFR, Est Non African American 89 >=60 mL/min  TSH     Status: None   Collection Time: 11/09/16  9:14 AM  Result Value Ref Range   TSH 1.60 mIU/L    Comment:   Reference Range   > or = 20 Years  0.40-4.50   Pregnancy Range First trimester  0.26-2.66 Second trimester 0.55-2.73 Third trimester  0.43-2.91     Lipase     Status: None   Collection Time: 11/09/16  9:14 AM  Result Value Ref Range   Lipase 23 7 - 60 U/L  Amylase     Status: None   Collection Time: 11/09/16  9:14 AM  Result Value Ref Range   Amylase 22 21 - 101 U/L  Gamma GT     Status: Abnormal   Collection Time: 11/09/16  9:14 AM  Result Value Ref Range   GGT 107 (H) 3 - 70 U/L     ASSESSMENT/PLAN:   Chronic pancreatitis, unspecified pancreatitis type (HCC) - Plan: CBC with Differential/Platelet, COMPLETE METABOLIC PANEL WITH GFR, Lipase, CT Abdomen Pelvis W Contrast, CANCELED: CT Abdomen Pelvis W Contrast  Epigastric abdominal pain - Plan: CBC with Differential/Platelet, COMPLETE METABOLIC PANEL WITH GFR, TSH, Lipase, Amylase, Gamma GT, CT Abdomen Pelvis  W Contrast  Diarrhea, unspecified type - Plan: CBC with Differential/Platelet, COMPLETE METABOLIC PANEL WITH GFR, CT Abdomen Pelvis W Contrast, diphenoxylate-atropine (LOMOTIL) 2.5-0.025 MG tablet  Type 2 diabetes mellitus with complication, with long-term current use of insulin (HCC) - Plan: POCT UA - Microalbumin, POCT glycosylated hemoglobin (Hb A1C)  Essential hypertension  Colon cancer screening - Plan: Ambulatory referral to Gastroenterology  Breast cancer screening - Plan: MM DIGITAL SCREENING BILATERAL  Non-intractable vomiting with nausea, unspecified vomiting type - Plan: ondansetron (ZOFRAN-ODT) 4 MG disintegrating tablet    Patient Instructions  I suspect we might have a pancreatitis on our hands - let's get a CT scan and some labs to figure out if that's the case or if something else is going on.   If your pain worsens, if you start to pass black/bloody stool or if you throw up anything that looks like blood or coffee grounds or any other concerns, please seek emergency care!  In the meantime, I've sent some medicine for nausea and diarrhea, and see below for diet instructions. Clear liquid until feeling better then slowly progress to full liquid then bland diets.   If labs/scan are concerning for severe disease, we may need to send you to the hospital - we will be in touch as soon as I know more from the results.    Follow-up plan: Return for RECHECK DEPENDING ON RESULTS AND SYMPTOMS .  Visit summary with medication list and pertinent instructions was printed for patient to review, alert [place] if any changes needed. All questions at time of visit were answered - patient instructed to contact office with any additional concerns. ER/RTC precautions were reviewed with the patient and understanding verbalized.   Note: Total time spent 40 minutes, greater than  50% of the visit was spent face-to-face counseling and coordinating care for the following: The primary encounter diagnosis  was Chronic pancreatitis, unspecified pancreatitis type ([place]). Diagnoses of Epigastric abdominal pain, Diarrhea, unspecified type, Type 2 diabetes mellitus with complication, with long-term current use of insulin ([place]), Essential hypertension, Colon cancer screening, Breast cancer screening, and Non-intractable vomiting with nausea, unspecified vomiting type were also pertinent to this visit..    Addendum: 11/09/16 4:23 PM   Ct Abdomen Pelvis W Contrast  Result Date: 11/09/2016 CLINICAL DATA:  cramping and abd pain x 3 weeks, pt states both have gotten progressively worse over past three weeks, pt has tried several things without relief, diarrhea, whipple, chole., hysterectomy, pancre.*comment was truncated*^139m ISOVUE-300 IOPAMIDOL (ISOVUE-300) INJECTION 61% EXAM: CT ABDOMEN AND PELVIS WITH CONTRAST TECHNIQUE: Multidetector CT imaging of the abdomen and pelvis was performed using the standard protocol following bolus administration of intravenous contrast. CONTRAST:  1046mISOVUE-300 IOPAMIDOL (ISOVUE-300) INJECTION 61% COMPARISON:  CT 04/22/2009 FINDINGS: Lower chest: Lung bases are clear. Hepatobiliary: Diffuse low-attenuation liver consistent with hepatic steatosis. No focal lesion. No duct dilatation. Postcholecystectomy. Pancreas: Partial pancreatectomy. No lesion the body and tail the pancreas. No duct dilatation. Spleen: Normal spleen Adrenals/urinary tract: Adrenal glands and kidneys are normal. The ureters and bladder normal. Stomach/Bowel: Stomach, small bowel, appendix, and cecum are normal. The colon and rectosigmoid colon are normal. Vascular/Lymphatic: Abdominal aorta is normal caliber with atherosclerotic calcification. There is no retroperitoneal or periportal lymphadenopathy. No pelvic lymphadenopathy. No periportal lymphadenopathy. No retroperitoneal adenopathy. No pelvic lymphadenopathy. Reproductive: Post hysterectomy. Other: No peritoneal nodularity.  No free fluid Musculoskeletal: No  aggressive osseous lesion. IMPRESSION: 1. No acute abdominal or pelvic findings. 2. Hepatic steatosis. 3. Postsurgical change consistent with partial pancreatectomy ([place].) Remaining pancreas normal. 4. No acute findings in the bowel. 5. Normal appendix. 6. Post hysterectomy Electronically Signed   By: StSuzy Bouchard.D.   On: 11/09/2016 15:59   Spoke to patient and informed of results: No pancreatitis based on labs or imaging. At this point, I'm more suspicious of something like a gastric/duodenal ulcer or gastritis, though infectious gastritis causing diarrhea for 2 weeks seems unusual. We'll go ahead and get stool studies and tests for H. pylori, patient was provided with collection materials from the lab, orders are in, H. pylori breath test pending as well, initiated PPI and will refer to GI for likely endoscopy/colonoscopy.  Updated assessment plan:  Chronic pancreatitis, unspecified pancreatitis type ([place]- Plan: CBC with Differential/Platelet, COMPLETE METABOLIC PANEL WITH GFR, Lipase, CT Abdomen Pelvis W Contrast, CANCELED: CT Abdomen Pelvis W Contrast  Epigastric abdominal pain - Plan: CBC with Differential/Platelet, COMPLETE METABOLIC PANEL WITH GFR, TSH, Lipase, Amylase, Gamma GT, CT Abdomen Pelvis W Contrast  Diarrhea, unspecified type - Plan: CBC with Differential/Platelet, COMPLETE METABOLIC PANEL WITH GFR, CT Abdomen Pelvis W Contrast, diphenoxylate-atropine (LOMOTIL) 2.5-0.025 MG tablet, Fecal occult blood, imunochemical, Fecal fat, qualtitative, Stool, WBC/Lactoferrin, Stool Culture, Ova and parasite examination  Non-intractable vomiting with nausea, unspecified vomiting type - Plan: ondansetron (ZOFRAN-ODT) 4 MG disintegrating tablet

## 2016-11-12 ENCOUNTER — Other Ambulatory Visit: Payer: Self-pay

## 2016-11-12 LAB — H. PYLORI BREATH TEST: H. PYLORI BREATH TEST: DETECTED — AB

## 2016-11-12 MED ORDER — ATENOLOL 25 MG PO TABS: ORAL_TABLET | ORAL | 0 refills | Status: DC

## 2016-11-13 MED ORDER — AMOXICILLIN 500 MG PO TABS
1000.0000 mg | ORAL_TABLET | Freq: Two times a day (BID) | ORAL | 0 refills | Status: DC
Start: 2016-11-13 — End: 2017-01-11

## 2016-11-13 MED ORDER — CLARITHROMYCIN 500 MG PO TABS: 500.0000 mg | ORAL_TABLET | Freq: Two times a day (BID) | ORAL | 0 refills | Status: DC

## 2016-11-13 NOTE — Addendum Note (Signed)
Addended by: Maryla Morrow on: 11/13/2016 07:48 AM   Modules accepted: Orders

## 2016-11-13 NOTE — Progress Notes (Signed)
Antibiotics sent to pharmacy, with still recommend she keep follow-up with GI

## 2016-11-15 ENCOUNTER — Ambulatory Visit (INDEPENDENT_AMBULATORY_CARE_PROVIDER_SITE_OTHER): Payer: BLUE CROSS/BLUE SHIELD | Admitting: Sports Medicine

## 2016-11-15 ENCOUNTER — Ambulatory Visit (INDEPENDENT_AMBULATORY_CARE_PROVIDER_SITE_OTHER): Payer: BLUE CROSS/BLUE SHIELD

## 2016-11-15 ENCOUNTER — Encounter: Payer: Self-pay | Admitting: Sports Medicine

## 2016-11-15 DIAGNOSIS — W19XXXA Unspecified fall, initial encounter: Secondary | ICD-10-CM

## 2016-11-15 DIAGNOSIS — M25562 Pain in left knee: Secondary | ICD-10-CM | POA: Diagnosis not present

## 2016-11-15 DIAGNOSIS — M25552 Pain in left hip: Secondary | ICD-10-CM

## 2016-11-15 DIAGNOSIS — M5136 Other intervertebral disc degeneration, lumbar region: Secondary | ICD-10-CM | POA: Diagnosis not present

## 2016-11-15 DIAGNOSIS — M25572 Pain in left ankle and joints of left foot: Secondary | ICD-10-CM | POA: Diagnosis not present

## 2016-11-15 DIAGNOSIS — S9305XA Dislocation of left ankle joint, initial encounter: Secondary | ICD-10-CM | POA: Diagnosis not present

## 2016-11-15 DIAGNOSIS — M5489 Other dorsalgia: Secondary | ICD-10-CM | POA: Diagnosis not present

## 2016-11-15 DIAGNOSIS — M25561 Pain in right knee: Secondary | ICD-10-CM

## 2016-11-15 MED ORDER — KETOROLAC TROMETHAMINE 30 MG/ML IJ SOLN
30.0000 mg | Freq: Once | INTRAMUSCULAR | Status: AC
  Administered 2016-11-15: 30 mg via INTRAVENOUS

## 2016-11-15 MED ORDER — HYDROCODONE-ACETAMINOPHEN 5-325 MG PO TABS: 1.0000 | ORAL_TABLET | Freq: Three times a day (TID) | ORAL | 0 refills | Status: DC | PRN

## 2016-11-15 MED ORDER — PREDNISONE 50 MG PO TABS: ORAL_TABLET | ORAL | 0 refills | Status: DC

## 2016-11-15 NOTE — Assessment & Plan Note (Signed)
[address] off a porch, pain in the back, left hip, knee consistent with torn meniscus, and lateral ankle consistent with a sprain. We are going to x-ray all of the structures and I'm going to give her 5 days of prednisone, 30 mg of Toradol intramuscular here in the office, Vicodin. She'll return to see me in one week and we can reevaluate.

## 2016-11-15 NOTE — Progress Notes (Signed)
Subjective:    I'm seeing this patient as a consultation for:  Dr. Emeterio Reeve  CC: Follow-up  HPI: Yesterday this 51 year old female was accident we knocked off a porch, she fell on her left side, she has baseline degenerative disc disease but developed a increase in her low back pain, she also has pain in her left lateral hip, left posterior lateral knee, and left lateral ankle. Moderate, persistent.  Past medical history:  Negative.  See flowsheet/record as well for more information.  Surgical history: Negative.  See flowsheet/record as well for more information.  Family history: Negative.  See flowsheet/record as well for more information.  Social history: Negative.  See flowsheet/record as well for more information.  Allergies, and medications have been entered into the medical record, reviewed, and no changes needed.   Review of Systems: No headache, visual changes, nausea, vomiting, diarrhea, constipation, dizziness, abdominal pain, skin rash, fevers, chills, night sweats, weight loss, swollen lymph nodes, body aches, joint swelling, muscle aches, chest pain, shortness of breath, mood changes, visual or auditory hallucinations.   Objective:   General: Well Developed, well nourished, and in no acute distress.  Neuro/Psych: Alert and oriented x3, extra-ocular muscles intact, able to move all 4 extremities, sensation grossly intact. Skin: Warm and dry, no rashes noted.  Respiratory: Not using accessory muscles, speaking in full sentences, trachea midline.  Cardiovascular: Pulses palpable, no extremity edema. Abdomen: Does not appear distended. Back Exam:  Inspection: Unremarkable  Motion: Flexion 45 deg, Extension 45 deg, Side Bending to 45 deg bilaterally,  Rotation to 45 deg bilaterally  SLR laying: Negative  XSLR laying: Negative  Palpable tenderness: None. FABER: negative. Sensory change: Gross sensation intact to all lumbar and sacral dermatomes.  Reflexes: 2+ at  both patellar tendons, 2+ at achilles tendons, Babinski's downgoing.  Strength at foot  Plantar-flexion: 5/5 Dorsi-flexion: 5/5 Eversion: 5/5 Inversion: 5/5  Leg strength  Quad: 5/5 Hamstring: 5/5 Hip flexor: 5/5 Hip abductors: 5/5  Gait unremarkable. Left Hip: ROM IR: 60 Deg, ER: 60 Deg, Flexion: 120 Deg, Extension: 100 Deg, Abduction: 45 Deg, Adduction: 45 Deg Strength IR: 5/5, ER: 5/5, Flexion: 5/5, Extension: 5/5, Abduction: 5/5, Adduction: 5/5 Pelvic alignment unremarkable to inspection and palpation. Standing hip rotation and gait without trendelenburg / unsteadiness. Greater trochanter with tenderness to palpation. No tenderness over piriformis. No SI joint tenderness and normal minimal SI movement. Left Knee: Visibly swollen with tenderness at the posterior medial joint line ROM normal in flexion and extension and lower leg rotation. Ligaments with solid consistent endpoints including ACL, PCL, LCL, MCL. Negative Mcmurray's and provocative meniscal tests. Non painful patellar compression. Patellar and quadriceps tendons unremarkable. Hamstring and quadriceps strength is normal. Left Ankle: No visible erythema or swelling. Range of motion is full in all directions. Strength is 5/5 in all directions. Stable lateral and medial ligaments; squeeze test and kleiger test unremarkable; Talar dome nontender; only minimal tenderness over the ATFL No pain at base of 5th MT; No tenderness over cuboid; No tenderness over N spot or navicular prominence No tenderness on posterior aspects of lateral and medial malleolus No sign of peroneal tendon subluxations; Negative tarsal tunnel tinel's Able to walk 4 steps.  Toradol milligrams intramuscular given.  Impression and Recommendations:   This case required medical decision making of moderate complexity.  Fall [place] off a porch, pain in the back, left hip, knee consistent with torn meniscus, and lateral ankle consistent with a  sprain. We are going to x-ray all of  the structures and I'm going to give her 5 days of prednisone, 30 mg of Toradol intramuscular here in the office, Vicodin. She'll return to see me in one week and we can reevaluate.

## 2016-11-21 ENCOUNTER — Ambulatory Visit: Payer: Self-pay | Admitting: Sports Medicine

## 2016-11-21 DIAGNOSIS — Z0189 Encounter for other specified special examinations: Secondary | ICD-10-CM

## 2016-11-23 ENCOUNTER — Other Ambulatory Visit: Payer: Self-pay | Admitting: Medical

## 2016-11-28 ENCOUNTER — Other Ambulatory Visit: Payer: Self-pay | Admitting: *Deleted

## 2016-11-28 ENCOUNTER — Other Ambulatory Visit: Payer: Self-pay | Admitting: Osteopathic Medicine

## 2016-11-28 MED ORDER — PANTOPRAZOLE SODIUM 40 MG PO TBEC: 40.0000 mg | DELAYED_RELEASE_TABLET | Freq: Two times a day (BID) | ORAL | 4 refills | Status: AC

## 2016-12-06 ENCOUNTER — Other Ambulatory Visit (HOSPITAL_COMMUNITY): Payer: Self-pay | Admitting: Psychiatry

## 2016-12-07 NOTE — Telephone Encounter (Signed)
Medication refill- received fax from [facility] requesting a 90 day order for Minipress. Per Dr. De Nurse, refill request is denied. Pt picked up 90 day prescription on 10/30/16. Next refill is due on 01/30/17. Pt's next apt is schedule on 01/18/17. Nothing further is need at this time.

## 2016-12-17 ENCOUNTER — Other Ambulatory Visit (HOSPITAL_COMMUNITY): Payer: Self-pay | Admitting: *Deleted

## 2016-12-17 ENCOUNTER — Other Ambulatory Visit (HOSPITAL_COMMUNITY): Payer: Self-pay | Admitting: Psychiatry

## 2016-12-17 MED ORDER — PRAZOSIN HCL 1 MG PO CAPS: 1.0000 mg | ORAL_CAPSULE | Freq: Every day | ORAL | 0 refills | Status: DC

## 2016-12-17 NOTE — Telephone Encounter (Signed)
Medication refill- received fax from [facility] requesting a refill for Prazosin. Per Dr. De Nurse, refill is authorize for Prazosin 1mg , #90. rx was sent to pharmacy. Pt's next apt is schedule on 01/18/17. Lvm informing of refill status.

## 2016-12-18 NOTE — Telephone Encounter (Signed)
Received fax from [facility] requesting a refill for Buspar. Per Dr. De Nurse, refill is authorize for Buspar 7.5mg , #60. Rx was sent to pharmacy. Pt's next apt is schedule on 01/18/17. Lvm informing pt of refill status.

## 2017-01-11 ENCOUNTER — Ambulatory Visit (INDEPENDENT_AMBULATORY_CARE_PROVIDER_SITE_OTHER): Payer: BLUE CROSS/BLUE SHIELD | Admitting: Osteopathic Medicine

## 2017-01-11 ENCOUNTER — Encounter: Payer: Self-pay | Admitting: Osteopathic Medicine

## 2017-01-11 VITALS — BP 156/90 | HR 85 | Wt 179.0 lb

## 2017-01-11 DIAGNOSIS — I10 Essential (primary) hypertension: Secondary | ICD-10-CM | POA: Diagnosis not present

## 2017-01-11 DIAGNOSIS — E119 Type 2 diabetes mellitus without complications: Secondary | ICD-10-CM

## 2017-01-11 DIAGNOSIS — F418 Other specified anxiety disorders: Secondary | ICD-10-CM | POA: Diagnosis not present

## 2017-01-11 LAB — POCT GLYCOSYLATED HEMOGLOBIN (HGB A1C): Hemoglobin A1C: 9

## 2017-01-11 MED ORDER — INSULIN DEGLUDEC 100 UNIT/ML ~~LOC~~ SOPN: 14.0000 [IU] | PEN_INJECTOR | Freq: Every day | SUBCUTANEOUS | 12 refills | Status: AC

## 2017-01-11 MED ORDER — PRAZOSIN HCL 1 MG PO CAPS: 1.0000 mg | ORAL_CAPSULE | Freq: Every day | ORAL | 0 refills | Status: DC

## 2017-01-11 MED ORDER — VENLAFAXINE HCL ER 150 MG PO CP24: ORAL_CAPSULE | ORAL | 0 refills | Status: DC

## 2017-01-11 MED ORDER — RISPERIDONE 1 MG PO TABS: 1.0000 mg | ORAL_TABLET | Freq: Every day | ORAL | 0 refills | Status: DC

## 2017-01-11 MED ORDER — CLONAZEPAM 0.5 MG PO TABS: 0.5000 mg | ORAL_TABLET | Freq: Every day | ORAL | 0 refills | Status: DC

## 2017-01-11 NOTE — Progress Notes (Signed)
HPI: Sara Leon is a 51 y.o. female  who presents to [facility] today, 01/11/17,  for chief complaint of:  Chief Complaint  Patient presents with  . Depression    Significant increased stress  . Diabetes    Stated pharmacy refused to refill Tyler Aas   Depression/stress: Patient has taken over the duties of her old loss at work in addition to her previous job since that person left. She has noticed some significant increased stress and panicking at work. She does not feel supported by management of human resources and requests some time off work to get herself situated.  Diabetes: A1c elevated today. She has been off of her insulin, states that pharmacy said that a refill request was declined. On record review from the medication list, it looks like it was being prescribed by different physician, I don't know that the pharmacy new to contact our office for continuation of refills.   Past medical, surgical, social and family history reviewed: Patient Active Problem List   Diagnosis Date Noted  . Fall 11/15/2016  . Greater trochanteric bursitis of both hips 05/04/2016  . High risk medication use 02/23/2016  . DJD (degenerative joint disease), ankle and foot 10/13/2015  . Right foot pain 10/03/2015  . Metatarsalgia of right foot 10/03/2015  . Lumbar radiculopathy, acute 05/30/2015  . Acute bronchitis 05/23/2015  . Solitary pulmonary nodule 03/31/2014  . S/P repair of ventral hernia 01/21/2014  . Type 2 diabetes mellitus ([place]) 08/07/2013  . Essential hypertension   . Hyperlipidemia 05/02/2011  . Depression with anxiety 05/02/2011  . Chronic pancreatitis ([place]) 10/16/2010    Class: Question of  . Pancreatic insufficiency 10/16/2010   Past Surgical History:  Procedure Laterality Date  . ABDOMINAL HYSTERECTOMY  1987   Partial due to heavy bleeding, still has her ovaries  . CHOLECYSTECTOMY  2000  . Dobbhoff feeding tube  07/04/09   [facility]   . 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 Eugenia Pancoast)  . PANCREATICODUODENECTOMY  06/10/09   [facility], Dr. Eugenia Pancoast  . TONSILLECTOMY  1971  . UPPER ENDOSCOPIC ULTRASOUND W/ FNA  05/12/2009   Uncinate process mass - Dr. Jerene Pitch  . UPPER GASTROINTESTINAL ENDOSCOPY  2005 and 2010 - [place], [place]   gastritis 2005 and 2010, retained food 2005, no H. pyloi and duodenal bxs normal   Social History  Substance Use Topics  . Smoking status: Current Every Day Smoker    Packs/day: 1.00  . Smokeless tobacco: Never Used  . Alcohol use No     Comment: started AA 05/2012.  Last drink early January   Family History  Problem Relation Age of Onset  . Cancer Mother 24       died of melanoma  . Hypertension Father   . Heart disease Father        MI  . Hyperlipidemia Father   . Other Father        died in [place]  . Heart disease Paternal Grandfather   . Crohn's disease Unknown        nephew  . Colon cancer Maternal Grandmother   . Cancer Maternal Grandmother        colon  . Cancer Maternal Grandfather        colon     Current medication list and allergy/intolerance information reviewed:   Current Outpatient Prescriptions  Medication Sig Dispense Refill  . AMBULATORY NON FORMULARY MEDICATION Diabetic  testing strips: OneTouch Verio: Use to check blood sugar up to three times a day. 50 Units 11  . atenolol (TENORMIN) 25 MG tablet TAKE 2 TABLETS BY MOUTH EVERY DAY *12/23* 180 tablet 0  . busPIRone (BUSPAR) 7.5 MG tablet TAKE ONE TABLET BY MOUTH 1-2 TIMES A DAY 30 tablet 2  . clonazePAM (KLONOPIN) 0.5 MG tablet Take 1 tablet (0.5 mg total) by mouth at bedtime. 30 tablet 3  . diphenoxylate-atropine (LOMOTIL) 2.5-0.025 MG tablet Take 1 tablet by mouth 4 (four) times daily as needed for diarrhea or loose stools. 30 tablet 3  . HYDROcodone-acetaminophen (NORCO/VICODIN) 5-325 MG tablet Take 1 tablet by mouth every 8 (eight) hours as needed for  moderate pain. 20 tablet 0  . insulin degludec (TRESIBA FLEXTOUCH) 100 UNIT/ML SOPN FlexTouch Pen Inject 0.14 mLs (14 Units total) into the skin daily. 3 pen 3  . Insulin Pen Needle (B-D UF III MINI PEN NEEDLES) 31G X 5 MM MISC Use to inject insulin daily. 50 each 11  . metFORMIN (GLUCOPHAGE) 1000 MG tablet Take 0.5 tablets (500 mg total) by mouth 2 (two) times daily with a meal. 90 tablet 1  . ondansetron (ZOFRAN-ODT) 4 MG disintegrating tablet Take 2 tablets (8 mg total) by mouth every 8 (eight) hours as needed for nausea or vomiting. 20 tablet 2  . pantoprazole (PROTONIX) 40 MG tablet Take 1 tablet (40 mg total) by mouth 2 (two) times daily before a meal. For 2 weeks, then take daily unless told otherwise by Dr. 60 tablet 4  . prazosin (MINIPRESS) 1 MG capsule Take 1 capsule (1 mg total) by mouth at bedtime. 90 capsule 0  . risperiDONE (RISPERDAL) 1 MG tablet Take 1 tablet (1 mg total) by mouth at bedtime. 90 tablet 0  . venlafaxine XR (EFFEXOR-XR) 150 MG 24 hr capsule TAKE 2 CAPSULES BY MOUTH EVERY DAY WITH BREAKFAST 60 capsule 3   No current facility-administered medications for this visit.    Allergies  Allergen Reactions  . Montelukast Cough  . Quinapril Cough      Review of Systems:  Constitutional:  No  fever, no chills, No recent illness  HEENT: No  headache, no vision change  Cardiac: No  chest pain, No  pressure, No palpitations  Respiratory:  No  shortness of breath.   Endocrine: No polyuria/polydipsia/polyphagia   Neurologic: No  weakness, No  dizziness  Psychiatric: +concerns with depression, +concerns with anxiety, +sleep problems  Exam:  BP (!) 156/90   Pulse 85   Wt 179 lb (81.2 kg)   BMI 29.79 kg/m   Constitutional: VS see above. General Appearance: alert, well-developed, well-nourished, NAD  Eyes: Normal lids and conjunctive, non-icteric sclera  Ears, Nose, Mouth, Throat: MMM, Normal external inspection ears/nares/mouth/lips/gums.   Neck: No masses,  trachea midline  Respiratory: Normal respiratory effort. no wheeze, no rhonchi, no rales  Cardiovascular: S1/S2 normal, no murmur, no rub/gallop auscultated. RRR. No lower extremity edema.   Musculoskeletal: Gait normal.   Neurological: Normal balance/coordination. No tremor. Skin: warm, dry, intact.   Psychiatric: Normal judgment/insight. Anxious mood and affect. Oriented x3.    Results for orders placed or performed in visit on 01/11/17 (from the past 72 hour(s))  POCT HgB A1C     Status: None   Collection Time: 01/11/17  8:56 AM  Result Value Ref Range   Hemoglobin A1C 9.0     Depression screen [facility] 2/9 01/11/2017 11/09/2016  Decreased Interest 1 1  Down, Depressed, Hopeless 1 1  PHQ - 2 Score 2 2  Altered sleeping 0 -  Tired, decreased energy 0 -  Change in appetite 0 -  Feeling bad or failure about yourself  0 -  Trouble concentrating 0 -  Moving slowly or fidgety/restless 0 -  Suicidal thoughts 0 -  PHQ-9 Score 2 -  Difficult doing work/chores Not difficult at all -     ASSESSMENT/PLAN:   Diabetes mellitus without complication (HCC) - Restarted insulin, follow-up in 3 months, A1c likely due to not being on the medication - Plan: POCT HgB A1C, insulin degludec (TRESIBA FLEXTOUCH) 100 UNIT/ML SOPN FlexTouch Pen  Depression with anxiety - Patient has upcoming appointment with psych, refilled short-term prescriptions until she can see them. Work note written, FMLA to me or psych if need - Plan: clonazePAM (KLONOPIN) 0.5 MG tablet, prazosin (MINIPRESS) 1 MG capsule, risperiDONE (RISPERDAL) 1 MG tablet, venlafaxine XR (EFFEXOR-XR) 150 MG 24 hr capsule  Essential hypertension - Blood pressure a bit high on last visit 2, we'll have patient follow-up to recheck this hopefully will improve with decreased stress, if not - will change meds    Patient Instructions  Plan:   Short-term refills to hold you over until your appointment with Dr. De Nurse  Work note for few days out -  speak with your HR department about paperwork required for longer absence and send me and Dr. De Nurse copies of this paperwork.   Refilled Insulin, let's follow-up on your sugars in 3 months, sooner if needed!      Visit summary with medication list and pertinent instructions was printed for patient to review. All questions at time of visit were answered - patient instructed to contact office with any additional concerns. ER/RTC precautions were reviewed with the patient. Follow-up plan: Return in about 3 months (around 04/12/2017) for recheck diabetes, sooner if needed. BP recheck nurse visit when in building for psych appt next week.  Note: Total time spent 25 minutes, greater than 50% of the visit was spent face-to-face counseling and coordinating care for the following: The primary encounter diagnosis was Diabetes mellitus without complication ([place]). Diagnoses of Depression with anxiety and Essential hypertension were also pertinent to this visit.Marland Kitchen

## 2017-01-11 NOTE — Patient Instructions (Addendum)
Plan:   Short-term refills to hold you over until your appointment with Dr. De Nurse  Work note for few days out - speak with your HR department about paperwork required for longer absence and send me and Dr. De Nurse copies of this paperwork.   Refilled Insulin, let's follow-up on your sugars in 3 months, sooner if needed!

## 2017-01-16 ENCOUNTER — Ambulatory Visit: Payer: BLUE CROSS/BLUE SHIELD

## 2017-01-16 ENCOUNTER — Encounter (HOSPITAL_COMMUNITY): Payer: Self-pay | Admitting: Psychiatry

## 2017-01-16 ENCOUNTER — Ambulatory Visit (INDEPENDENT_AMBULATORY_CARE_PROVIDER_SITE_OTHER): Payer: BLUE CROSS/BLUE SHIELD | Admitting: Psychiatry

## 2017-01-16 VITALS — BP 160/96 | HR 64 | Resp 18 | Ht 65.0 in | Wt 177.0 lb

## 2017-01-16 DIAGNOSIS — F515 Nightmare disorder: Secondary | ICD-10-CM | POA: Diagnosis not present

## 2017-01-16 DIAGNOSIS — Z818 Family history of other mental and behavioral disorders: Secondary | ICD-10-CM | POA: Diagnosis not present

## 2017-01-16 DIAGNOSIS — F331 Major depressive disorder, recurrent, moderate: Secondary | ICD-10-CM | POA: Diagnosis not present

## 2017-01-16 DIAGNOSIS — F1390 Sedative, hypnotic, or anxiolytic use, unspecified, uncomplicated: Secondary | ICD-10-CM | POA: Diagnosis not present

## 2017-01-16 DIAGNOSIS — F17200 Nicotine dependence, unspecified, uncomplicated: Secondary | ICD-10-CM

## 2017-01-16 DIAGNOSIS — Z562 Threat of job loss: Secondary | ICD-10-CM

## 2017-01-16 DIAGNOSIS — F418 Other specified anxiety disorders: Secondary | ICD-10-CM

## 2017-01-16 DIAGNOSIS — F4310 Post-traumatic stress disorder, unspecified: Secondary | ICD-10-CM | POA: Diagnosis not present

## 2017-01-16 MED ORDER — VENLAFAXINE HCL ER 150 MG PO CP24: ORAL_CAPSULE | ORAL | 0 refills | Status: DC

## 2017-01-16 MED ORDER — RISPERIDONE 1 MG PO TABS: 1.0000 mg | ORAL_TABLET | Freq: Every day | ORAL | 0 refills | Status: DC

## 2017-01-16 MED ORDER — PRAZOSIN HCL 1 MG PO CAPS: 1.0000 mg | ORAL_CAPSULE | Freq: Every day | ORAL | 0 refills | Status: DC
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
## 2017-01-18 ENCOUNTER — Telehealth: Payer: Self-pay

## 2017-01-18 ENCOUNTER — Other Ambulatory Visit: Payer: Self-pay | Admitting: Osteopathic Medicine

## 2017-01-18 ENCOUNTER — Ambulatory Visit (HOSPITAL_COMMUNITY): Payer: Self-pay | Admitting: Psychiatry

## 2017-01-18 DIAGNOSIS — Z1231 Encounter for screening mammogram for malignant neoplasm of breast: Secondary | ICD-10-CM

## 2017-01-18 MED ORDER — HYDROCHLOROTHIAZIDE 12.5 MG PO TABS: 12.5000 mg | ORAL_TABLET | Freq: Every day | ORAL | 3 refills | Status: DC

## 2017-01-18 NOTE — Telephone Encounter (Signed)
We had discussed her coming in to the office for BP recheck - it has been high on multiple occasions but she has been reluctant to start medications.   Call patient: I think we can go ahead and send something in for her and have her come see me in 2 weeks to recheck BP on new medications. I sent Rx to her pharmacy on file

## 2017-01-18 NOTE — Telephone Encounter (Signed)
Pt stated that she saw you last Friday for increased BP.  She saw Dr. Nicole Cella yesterday, and it was 158/100, and today it is 147/93.  She is not feeling well and wants to know if she needs to be on medication or if she needs a follow up with you?  Please advise.

## 2017-01-18 NOTE — Telephone Encounter (Signed)
I notified patient of medication sent and to follow up in 2 weeks.  She asked if she should continue the atenolol?  Please advise.

## 2017-01-18 NOTE — Telephone Encounter (Signed)
Yes

## 2017-01-18 NOTE — Telephone Encounter (Signed)
Left message on patient vm advising of this. Rhonda Cunningham,CMA

## 2017-01-21 ENCOUNTER — Telehealth (HOSPITAL_COMMUNITY): Payer: Self-pay | Admitting: *Deleted

## 2017-01-21 ENCOUNTER — Ambulatory Visit
Admission: RE | Admit: 2017-01-21 | Discharge: 2017-01-21 | Disposition: A | Discharge status: Home / Self Care | Payer: BLUE CROSS/BLUE SHIELD | Source: Ambulatory Visit | Attending: Osteopathic Medicine | Admitting: Osteopathic Medicine

## 2017-01-21 NOTE — Telephone Encounter (Signed)
Called and informed pt FMLA paperwork is ready for pickup. Pt will pickup paperwork on 01/23/17. Nothing further is need at this time.

## 2017-01-30 ENCOUNTER — Ambulatory Visit (INDEPENDENT_AMBULATORY_CARE_PROVIDER_SITE_OTHER): Payer: BLUE CROSS/BLUE SHIELD | Admitting: Licensed Clinical Social Worker

## 2017-01-30 DIAGNOSIS — F331 Major depressive disorder, recurrent, moderate: Secondary | ICD-10-CM | POA: Diagnosis not present

## 2017-01-30 DIAGNOSIS — F411 Generalized anxiety disorder: Secondary | ICD-10-CM

## 2017-01-30 DIAGNOSIS — F4310 Post-traumatic stress disorder, unspecified: Secondary | ICD-10-CM | POA: Diagnosis not present

## 2017-01-31 ENCOUNTER — Encounter (HOSPITAL_COMMUNITY): Payer: Self-pay | Admitting: Licensed Clinical Social Worker
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
## 2017-02-04 ENCOUNTER — Ambulatory Visit (INDEPENDENT_AMBULATORY_CARE_PROVIDER_SITE_OTHER): Payer: BLUE CROSS/BLUE SHIELD | Admitting: Licensed Clinical Social Worker

## 2017-02-04 ENCOUNTER — Ambulatory Visit (HOSPITAL_COMMUNITY): Payer: Self-pay | Admitting: Licensed Clinical Social Worker

## 2017-02-04 DIAGNOSIS — F331 Major depressive disorder, recurrent, moderate: Secondary | ICD-10-CM | POA: Diagnosis not present

## 2017-02-04 DIAGNOSIS — F4310 Post-traumatic stress disorder, unspecified: Secondary | ICD-10-CM | POA: Diagnosis not present

## 2017-02-04 DIAGNOSIS — F411 Generalized anxiety disorder: Secondary | ICD-10-CM

## 2017-02-04 NOTE — Progress Notes (Signed)
   THERAPIST PROGRESS NOTE  Session Time: 1:00pm-1:58pm  Participation Level: Active  Behavioral Response: CasualAlertAnxious  Type of Therapy: Individual Therapy  Treatment Goals addressed: Improve self confidence and reduce anxiety  Interventions: Treatment planning, psycho-ed about anxiety, relaxation training    Suicidal/Homicidal: Denied both  Therapist Interventions: Collaborated with patient to develop her treatment plan.   Briefly described interventions she can expect as she participates in therapy. Reviewed the concept of fight or flight and explained how it is a response that is activated whenever you think there could be a potential threat.  Noted this happens whether the threat is real or not.  Reviewed bodily changes that tend to occur in fight or flight.  Emphasized that panic symptoms are not dangerous.   Taught patient an exercise called the 4-7-8 Breath.  Had her watch a video of someone demonstrating the technique.  Recommended practicing the exercise twice a day as well as at times when she notices that her anxiety level has increased.      Summary:  Decided to focus on improving self confidence.  Talked about how she used to think positively about herself but the circumstances with work have caused her to doubt herself. Indicated she thought some of the interventions therapist described could be helpful. Expressed interest in using the breathing exercise.  Noted that there have been times when she has listened to meditations on YouTube and found them to be relaxing.  Reported she plans to practice this breathing exercise early in the morning and again before going to bed.    Plan: May focus on teaching mindfulness at next session.  Diagnosis: Generalized Anxiety Disorder                         Major Depressive Disorder, recurrent, moderate                         PTSD    Sara Leon 02/04/2017

## 2017-02-06 ENCOUNTER — Ambulatory Visit (INDEPENDENT_AMBULATORY_CARE_PROVIDER_SITE_OTHER): Payer: BLUE CROSS/BLUE SHIELD | Admitting: Osteopathic Medicine

## 2017-02-06 ENCOUNTER — Encounter (HOSPITAL_COMMUNITY): Payer: Self-pay | Admitting: Psychiatry

## 2017-02-06 ENCOUNTER — Encounter: Payer: Self-pay | Admitting: Osteopathic Medicine

## 2017-02-06 ENCOUNTER — Ambulatory Visit (INDEPENDENT_AMBULATORY_CARE_PROVIDER_SITE_OTHER): Payer: BLUE CROSS/BLUE SHIELD | Admitting: Psychiatry

## 2017-02-06 VITALS — BP 122/77 | HR 76 | Ht 65.0 in | Wt 172.0 lb

## 2017-02-06 VITALS — BP 152/88 | HR 75 | Resp 16 | Ht 65.0 in | Wt 171.0 lb

## 2017-02-06 DIAGNOSIS — F331 Major depressive disorder, recurrent, moderate: Secondary | ICD-10-CM

## 2017-02-06 DIAGNOSIS — F410 Panic disorder [episodic paroxysmal anxiety] without agoraphobia: Secondary | ICD-10-CM

## 2017-02-06 DIAGNOSIS — F411 Generalized anxiety disorder: Secondary | ICD-10-CM | POA: Diagnosis not present

## 2017-02-06 DIAGNOSIS — R450 Nervousness: Secondary | ICD-10-CM | POA: Diagnosis not present

## 2017-02-06 DIAGNOSIS — Z79899 Other long term (current) drug therapy: Secondary | ICD-10-CM | POA: Diagnosis not present

## 2017-02-06 DIAGNOSIS — Z560 Unemployment, unspecified: Secondary | ICD-10-CM | POA: Diagnosis not present

## 2017-02-06 DIAGNOSIS — F4310 Post-traumatic stress disorder, unspecified: Secondary | ICD-10-CM | POA: Diagnosis not present

## 2017-02-06 DIAGNOSIS — Z0000 Encounter for general adult medical examination without abnormal findings: Secondary | ICD-10-CM

## 2017-02-06 DIAGNOSIS — Z818 Family history of other mental and behavioral disorders: Secondary | ICD-10-CM

## 2017-02-06 DIAGNOSIS — F17210 Nicotine dependence, cigarettes, uncomplicated: Secondary | ICD-10-CM

## 2017-02-06 DIAGNOSIS — I10 Essential (primary) hypertension: Secondary | ICD-10-CM

## 2017-02-06 DIAGNOSIS — F418 Other specified anxiety disorders: Secondary | ICD-10-CM

## 2017-02-06 DIAGNOSIS — E119 Type 2 diabetes mellitus without complications: Secondary | ICD-10-CM | POA: Diagnosis not present

## 2017-02-06 MED ORDER — VENLAFAXINE HCL ER 150 MG PO CP24: ORAL_CAPSULE | ORAL | 0 refills | Status: DC

## 2017-02-06 MED ORDER — CLONAZEPAM 0.5 MG PO TABS: 0.5000 mg | ORAL_TABLET | Freq: Every day | ORAL | 0 refills | Status: DC

## 2017-02-06 MED ORDER — RISPERIDONE 1 MG PO TABS: 1.0000 mg | ORAL_TABLET | Freq: Every day | ORAL | 0 refills | Status: DC

## 2017-02-06 MED ORDER — PRAZOSIN HCL 1 MG PO CAPS: 1.0000 mg | ORAL_CAPSULE | Freq: Every day | ORAL | 0 refills | Status: DC

## 2017-02-06 MED ORDER — HYDROCHLOROTHIAZIDE 12.5 MG PO TABS: 12.5000 mg | ORAL_TABLET | Freq: Every day | ORAL | 3 refills | Status: AC

## 2017-02-06 NOTE — Progress Notes (Signed)
HPI: Sara Leon is a 51 y.o. female  who presents to [place] today, 02/06/17,  for chief complaint of:  Chief Complaint  Patient presents with  . Follow-up    BLOOD PRESSURE    Blood pressure significantly improved. Stress levels have gotten a bit better, she has found a new job and is working on quitting previous one which was causing significant stress issues. No chest pain, pressure, shortness of breath. At home blood pressure cuff has not been verified   Past medical history, surgical history, social history and family history reviewed.  Patient Active Problem List   Diagnosis Date Noted  . Fall 11/15/2016  . Greater trochanteric bursitis of both hips 05/04/2016  . High risk medication use 02/23/2016  . DJD (degenerative joint disease), ankle and foot 10/13/2015  . Right foot pain 10/03/2015  . Metatarsalgia of right foot 10/03/2015  . Lumbar radiculopathy, acute 05/30/2015  . Acute bronchitis 05/23/2015  . Solitary pulmonary nodule 03/31/2014  . S/P repair of ventral hernia 01/21/2014  . Type 2 diabetes mellitus ([place]) 08/07/2013  . Essential hypertension   . Hyperlipidemia 05/02/2011  . Depression with anxiety 05/02/2011  . Chronic pancreatitis ([place]) 10/16/2010    Class: Question of  . Pancreatic insufficiency 10/16/2010    Current medication list and allergy/intolerance information reviewed.   Current Outpatient Prescriptions on File Prior to Visit  Medication Sig Dispense Refill  . AMBULATORY NON FORMULARY MEDICATION Diabetic testing strips: OneTouch Verio: Use to check blood sugar up to three times a day. 50 Units 11  . atenolol (TENORMIN) 25 MG tablet TAKE 2 TABLETS BY MOUTH EVERY DAY *12/23* 180 tablet 0  . busPIRone (BUSPAR) 7.5 MG tablet TAKE ONE TABLET BY MOUTH 1-2 TIMES A DAY 30 tablet 2  . clonazePAM (KLONOPIN) 0.5 MG tablet Take 1 tablet (0.5 mg total) by mouth at bedtime. 15 tablet 0  . hydrochlorothiazide  (HYDRODIURIL) 12.5 MG tablet Take 1 tablet (12.5 mg total) by mouth daily. 30 tablet 3  . HYDROcodone-acetaminophen (NORCO/VICODIN) 5-325 MG tablet Take 1 tablet by mouth every 8 (eight) hours as needed for moderate pain. 20 tablet 0  . insulin degludec (TRESIBA FLEXTOUCH) 100 UNIT/ML SOPN FlexTouch Pen Inject 0.14 mLs (14 Units total) into the skin daily. 3 pen 12  . Insulin Pen Needle (B-D UF III MINI PEN NEEDLES) 31G X 5 MM MISC Use to inject insulin daily. 50 each 11  . metFORMIN (GLUCOPHAGE) 1000 MG tablet Take 0.5 tablets (500 mg total) by mouth 2 (two) times daily with a meal. 90 tablet 1  . pantoprazole (PROTONIX) 40 MG tablet Take 1 tablet (40 mg total) by mouth 2 (two) times daily before a meal. For 2 weeks, then take daily unless told otherwise by Dr. 60 tablet 4  . prazosin (MINIPRESS) 1 MG capsule Take 1 capsule (1 mg total) by mouth at bedtime. 30 capsule 0  . risperiDONE (RISPERDAL) 1 MG tablet Take 1 tablet (1 mg total) by mouth at bedtime. 30 tablet 0  . venlafaxine XR (EFFEXOR-XR) 150 MG 24 hr capsule TAKE 2 CAPSULES BY MOUTH EVERY DAY WITH BREAKFAST 60 capsule 0  . [DISCONTINUED] ALPRAZolam (XANAX) 1 MG tablet Take 1 tablet (1 mg total) by mouth 2 (two) times daily as needed for anxiety. NEED FOLLOW UP APPOINTMENT FOR MORE REFILLS 60 tablet 0  . [DISCONTINUED] ARIPiprazole (ABILIFY) 10 MG tablet Take 1 tablet (10 mg total) by mouth daily. 90 tablet 0   No  current facility-administered medications on file prior to visit.    Allergies  Allergen Reactions  . Montelukast Cough  . Quinapril Cough      Review of Systems:  Constitutional: No recent illness  HEENT: No  headache, no vision change  Cardiac: No  chest pain, No  pressure, No palpitations  Respiratory:  No  shortness of breath. No  Cough  Neurologic: No  weakness, No  Dizziness   Exam:  BP 122/77   Pulse 76   Ht 5\' 5"  (1.651 m)   Wt 172 lb (78 kg)   BMI 28.62 kg/m   Constitutional: VS see above.  General Appearance: alert, well-developed, well-nourished, NAD  Eyes: Normal lids and conjunctive, non-icteric sclera  Ears, Nose, Mouth, Throat: MMM, Normal external inspection ears/nares/mouth/lips/gums.  Neck: No masses, trachea midline.   Respiratory: Normal respiratory effort. no wheeze, no rhonchi, no rales  Cardiovascular: S1/S2 normal, no murmur, no rub/gallop auscultated. RRR.   Musculoskeletal: Gait normal. Symmetric and independent movement of all extremities  Neurological: Normal balance/coordination. No tremor.  Skin: warm, dry, intact.   Psychiatric: Normal judgment/insight. Normal mood and affect. Oriented x3.       ASSESSMENT/PLAN: We'll get labs for next visit, preventive care was not performed or coded today.  Essential hypertension - Plan: CBC, COMPLETE METABOLIC PANEL WITH GFR, Lipid panel, TSH  Depression with anxiety  Diabetes mellitus without complication ([place]) - Plan: CBC, COMPLETE METABOLIC PANEL WITH GFR, Lipid panel, TSH  Annual physical exam - Plan: CBC, COMPLETE METABOLIC PANEL WITH GFR, Lipid panel, TSH      Follow-up plan: Return in about 8 weeks (around 04/03/2017) for recheck sugars .  Visit summary with medication list and pertinent instructions was printed for patient to review, alert [place] if any changes needed. All questions at time of visit were answered - patient instructed to contact office with any additional concerns. ER/RTC precautions were reviewed with the patient and understanding verbalized.
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
## 2017-02-17 ENCOUNTER — Other Ambulatory Visit: Payer: Self-pay | Admitting: Osteopathic Medicine

## 2017-02-20 ENCOUNTER — Ambulatory Visit (HOSPITAL_COMMUNITY): Payer: Self-pay | Admitting: Licensed Clinical Social Worker

## 2017-02-21 ENCOUNTER — Ambulatory Visit (INDEPENDENT_AMBULATORY_CARE_PROVIDER_SITE_OTHER): Payer: BLUE CROSS/BLUE SHIELD | Admitting: Osteopathic Medicine

## 2017-02-21 ENCOUNTER — Encounter: Payer: Self-pay | Admitting: Osteopathic Medicine

## 2017-02-21 VITALS — BP 130/85 | HR 70 | Ht 65.0 in | Wt 172.0 lb

## 2017-02-21 DIAGNOSIS — F418 Other specified anxiety disorders: Secondary | ICD-10-CM | POA: Diagnosis not present

## 2017-02-21 DIAGNOSIS — K120 Recurrent oral aphthae: Secondary | ICD-10-CM

## 2017-02-21 DIAGNOSIS — I10 Essential (primary) hypertension: Secondary | ICD-10-CM | POA: Diagnosis not present

## 2017-02-21 NOTE — Progress Notes (Signed)
HPI: Sara Leon is a 51 y.o. female  who presents to [facility] today, 02/21/17,  for chief complaint of:  Chief Complaint  Patient presents with  . Follow-up    Needs letter to return to work, concerned about mouth ulceration    Recently following with psychiatry, has been out of work due to emotional disturbance issues. Now is requiring a letter to go back to work. She feels that going back on a reduced schedule after 03/04/2017 as discussed with her employer and with psychiatry is a feasible plan.  Certain about mouth ulceration and spots on tongue for about 2+ months at this point. Recently went to dentist who referred her to ENT. She has an appointment with ENT this afternoon but would like me to check up on the mouth as well.   Past medical history, surgical history, social history and family history reviewed.  Patient Active Problem List   Diagnosis Date Noted  . Fall 11/15/2016  . Greater trochanteric bursitis of both hips 05/04/2016  . High risk medication use 02/23/2016  . DJD (degenerative joint disease), ankle and foot 10/13/2015  . Right foot pain 10/03/2015  . Metatarsalgia of right foot 10/03/2015  . Lumbar radiculopathy, acute 05/30/2015  . Acute bronchitis 05/23/2015  . Solitary pulmonary nodule 03/31/2014  . S/P repair of ventral hernia 01/21/2014  . Type 2 diabetes mellitus ([place]) 08/07/2013  . Essential hypertension   . Hyperlipidemia 05/02/2011  . Depression with anxiety 05/02/2011  . Chronic pancreatitis ([place]) 10/16/2010    Class: Question of  . Pancreatic insufficiency 10/16/2010    Current medication list and allergy/intolerance information reviewed.   Current Outpatient Prescriptions on File Prior to Visit  Medication Sig Dispense Refill  . AMBULATORY NON FORMULARY MEDICATION Diabetic testing strips: OneTouch Verio: Use to check blood sugar up to three times a day. 50 Units 11  . atenolol (TENORMIN) 25 MG  tablet TAKE 2 TABLETS BY MOUTH EVERY DAY *12/23* 180 tablet 0  . busPIRone (BUSPAR) 7.5 MG tablet TAKE ONE TABLET BY MOUTH 1-2 TIMES A DAY 30 tablet 2  . clonazePAM (KLONOPIN) 0.5 MG tablet Take 1 tablet (0.5 mg total) by mouth at bedtime. 15 tablet 0  . hydrochlorothiazide (HYDRODIURIL) 12.5 MG tablet Take 1 tablet (12.5 mg total) by mouth daily. 90 tablet 3  . insulin degludec (TRESIBA FLEXTOUCH) 100 UNIT/ML SOPN FlexTouch Pen Inject 0.14 mLs (14 Units total) into the skin daily. 3 pen 12  . Insulin Pen Needle (B-D UF III MINI PEN NEEDLES) 31G X 5 MM MISC Use to inject insulin daily. 50 each 11  . metFORMIN (GLUCOPHAGE) 500 MG tablet TAKE ONE TABLET BY MOUTH TWICE A DAY WITH A MEAL 180 tablet 1  . pantoprazole (PROTONIX) 40 MG tablet Take 1 tablet (40 mg total) by mouth 2 (two) times daily before a meal. For 2 weeks, then take daily unless told otherwise by Dr. 60 tablet 4  . prazosin (MINIPRESS) 1 MG capsule Take 1 capsule (1 mg total) by mouth at bedtime. 30 capsule 0  . risperiDONE (RISPERDAL) 1 MG tablet Take 1 tablet (1 mg total) by mouth at bedtime. 30 tablet 0  . venlafaxine XR (EFFEXOR-XR) 150 MG 24 hr capsule TAKE 2 CAPSULES BY MOUTH EVERY DAY WITH BREAKFAST 60 capsule 0  . [DISCONTINUED] ALPRAZolam (XANAX) 1 MG tablet Take 1 tablet (1 mg total) by mouth 2 (two) times daily as needed for anxiety. NEED FOLLOW UP APPOINTMENT FOR MORE REFILLS  60 tablet 0  . [DISCONTINUED] ARIPiprazole (ABILIFY) 10 MG tablet Take 1 tablet (10 mg total) by mouth daily. 90 tablet 0   No current facility-administered medications on file prior to visit.    Allergies  Allergen Reactions  . Montelukast Cough  . Quinapril Cough      Review of Systems:  Constitutional: No recent illness  HEENT: No  headache, no vision change, small ulceration behind left lower blood, discoloration on inside of cheeks bilateral, concern for area of tenderness on lateral left tongue  Cardiac: No  chest pain, No  pressure,  No palpitations  Respiratory:  No  shortness of breath. No  Cough  Gastrointestinal: No  abdominal pain, no change on bowel habits  Musculoskeletal: No new myalgia/arthralgia  Skin: No  Rash  Hem/Onc: No  easy bruising/bleeding, No  abnormal lumps/bumps  Neurologic: No  weakness, No  Dizziness  Psychiatric: No  concerns with depression, No  concerns with anxiety  Exam:  BP 130/85   Pulse 70   Ht 5\' 5"  (1.651 m)   Wt 172 lb (78 kg)   BMI 28.62 kg/m   Constitutional: VS see above. General Appearance: alert, well-developed, well-nourished, NAD  Eyes: Normal lids and conjunctive, non-icteric sclera  Ears, Nose, Mouth, Throat: MMM, Normal external inspection ears/nares/mouth/lips/gums. Small aphthous ulcer left lower lip mucosa, slight area of hyperpigmentation on right inner cheek questionable superficial vascular structure versus other, no notable tongue abnormality is visible  Neck: No masses, trachea midline.   Respiratory: Normal respiratory effort. no wheeze, no rhonchi, no rales  Cardiovascular: S1/S2 normal, no murmur, no rub/gallop auscultated. RRR.   Musculoskeletal: Gait normal. Symmetric and independent movement of all extremities  Neurological: Normal balance/coordination. No tremor.  Skin: warm, dry, intact.   Psychiatric: Normal judgment/insight. Normal mood and affect. Oriented x3.    Recent Results (from the past 2160 hour(s))  POCT HgB A1C     Status: None   Collection Time: 01/11/17  8:56 AM  Result Value Ref Range   Hemoglobin A1C 9.0     No results found.  GAD 7 : Generalized Anxiety Score 01/30/2017  Nervous, Anxious, on Edge 3  Control/stop worrying 3  Worry too much - different things 3  Trouble relaxing 3  Restless 2  Easily annoyed or irritable 2  Afraid - awful might happen 3  Total GAD 7 Score 19  Anxiety Difficulty Very difficult    Depression screen [facility] 2/9 02/21/2017 01/30/2017 01/11/2017  Decreased Interest 1 2 1   Down,  Depressed, Hopeless 1 2 1   PHQ - 2 Score 2 4 2   Altered sleeping 3 3 0  Tired, decreased energy 1 2 0  Change in appetite 1 3 0  Feeling bad or failure about yourself  1 3 0  Trouble concentrating 1 2 0  Moving slowly or fidgety/restless 1 1 0  Suicidal thoughts 0 0 0  PHQ-9 Score 10 18 2   Difficult doing work/chores - - Not difficult at all      ASSESSMENT/PLAN:   Depression with anxiety - Wrote okay to go back to work 03/04/2017 for half days for 2 weeks. Additional paperwork to me or psych  Aphthous ulcer of mouth - Consider iron, B12, folate to assess for nutrient deficiencies, defer to ENT at this point in case biopsy warranted  Essential hypertension - BP good today       Follow-up plan: Return for sugar recheck late 03/2017 .- early 04/2017.  Visit summary with medication list  and pertinent instructions was printed for patient to review, alert [place] if any changes needed. All questions at time of visit were answered - patient instructed to contact office with any additional concerns. ER/RTC precautions were reviewed with the patient and understanding verbalized.   Note: Total time spent 25 minutes, greater than 50% of the visit was spent face-to-face counseling and coordinating care for the following: The primary encounter diagnosis was Depression with anxiety. A diagnosis of Aphthous ulcer of mouth was also pertinent to this visit.Marland Kitchen

## 2017-03-04 ENCOUNTER — Other Ambulatory Visit (HOSPITAL_COMMUNITY): Payer: Self-pay | Admitting: Psychiatry

## 2017-03-05 ENCOUNTER — Other Ambulatory Visit (HOSPITAL_COMMUNITY): Payer: Self-pay | Admitting: Psychiatry

## 2017-03-05 ENCOUNTER — Ambulatory Visit (HOSPITAL_COMMUNITY): Payer: Self-pay | Admitting: Psychiatry

## 2017-03-05 DIAGNOSIS — F418 Other specified anxiety disorders: Secondary | ICD-10-CM

## 2017-03-06 ENCOUNTER — Ambulatory Visit (INDEPENDENT_AMBULATORY_CARE_PROVIDER_SITE_OTHER): Payer: BLUE CROSS/BLUE SHIELD | Admitting: Psychiatry

## 2017-03-06 ENCOUNTER — Encounter (HOSPITAL_COMMUNITY): Payer: Self-pay | Admitting: Psychiatry

## 2017-03-06 DIAGNOSIS — F1390 Sedative, hypnotic, or anxiolytic use, unspecified, uncomplicated: Secondary | ICD-10-CM

## 2017-03-06 DIAGNOSIS — F411 Generalized anxiety disorder: Secondary | ICD-10-CM | POA: Diagnosis not present

## 2017-03-06 DIAGNOSIS — Z79899 Other long term (current) drug therapy: Secondary | ICD-10-CM | POA: Diagnosis not present

## 2017-03-06 DIAGNOSIS — F331 Major depressive disorder, recurrent, moderate: Secondary | ICD-10-CM

## 2017-03-06 DIAGNOSIS — F418 Other specified anxiety disorders: Secondary | ICD-10-CM

## 2017-03-06 DIAGNOSIS — F4310 Post-traumatic stress disorder, unspecified: Secondary | ICD-10-CM | POA: Diagnosis not present

## 2017-03-06 DIAGNOSIS — F17200 Nicotine dependence, unspecified, uncomplicated: Secondary | ICD-10-CM | POA: Diagnosis not present

## 2017-03-06 MED ORDER — RISPERIDONE 1 MG PO TABS: 1.0000 mg | ORAL_TABLET | Freq: Every day | ORAL | 1 refills | Status: DC

## 2017-03-06 MED ORDER — PRAZOSIN HCL 1 MG PO CAPS: 1.0000 mg | ORAL_CAPSULE | Freq: Every day | ORAL | 2 refills | Status: DC

## 2017-03-06 MED ORDER — VENLAFAXINE HCL ER 150 MG PO CP24: ORAL_CAPSULE | ORAL | 1 refills | Status: DC

## 2017-03-06 MED ORDER — BUSPIRONE HCL 7.5 MG PO TABS: ORAL_TABLET | ORAL | 2 refills | Status: DC

## 2017-03-06 MED ORDER — CLONAZEPAM 0.5 MG PO TABS: 0.5000 mg | ORAL_TABLET | Freq: Every day | ORAL | 0 refills | Status: DC
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
## 2017-03-09 ENCOUNTER — Other Ambulatory Visit: Payer: Self-pay | Admitting: Osteopathic Medicine

## 2017-03-19 ENCOUNTER — Ambulatory Visit (HOSPITAL_COMMUNITY): Payer: Self-pay | Admitting: Licensed Clinical Social Worker

## 2017-03-31 ENCOUNTER — Other Ambulatory Visit (HOSPITAL_COMMUNITY): Payer: Self-pay | Admitting: Psychiatry

## 2017-03-31 DIAGNOSIS — F418 Other specified anxiety disorders: Secondary | ICD-10-CM

## 2017-04-03 ENCOUNTER — Ambulatory Visit: Payer: Self-pay | Admitting: Osteopathic Medicine

## 2017-04-03 DIAGNOSIS — Z0189 Encounter for other specified special examinations: Secondary | ICD-10-CM

## 2017-04-25 ENCOUNTER — Telehealth (HOSPITAL_COMMUNITY): Payer: Self-pay | Admitting: Psychiatry

## 2017-04-25 DIAGNOSIS — F418 Other specified anxiety disorders: Secondary | ICD-10-CM

## 2017-04-25 NOTE — Telephone Encounter (Signed)
Pt needs refill on klonopin sent to [facility] in [place]

## 2017-04-26 MED ORDER — CLONAZEPAM 0.5 MG PO TABS: 0.5000 mg | ORAL_TABLET | Freq: Every day | ORAL | 0 refills | Status: DC

## 2017-04-26 NOTE — Telephone Encounter (Signed)
Called in refill for Clonazepam per Dr. De Nurse. Nothing further is needed at this time.

## 2017-04-26 NOTE — Telephone Encounter (Signed)
Can send if due 

## 2017-04-29 ENCOUNTER — Other Ambulatory Visit (HOSPITAL_COMMUNITY): Payer: Self-pay

## 2017-04-29 ENCOUNTER — Telehealth (HOSPITAL_COMMUNITY): Payer: Self-pay

## 2017-04-29 DIAGNOSIS — F418 Other specified anxiety disorders: Secondary | ICD-10-CM

## 2017-04-29 MED ORDER — RISPERIDONE 1 MG PO TABS: 1.0000 mg | ORAL_TABLET | Freq: Every day | ORAL | 0 refills | Status: DC

## 2017-04-29 NOTE — Telephone Encounter (Signed)
Pharmacy sent over fax requesting a refill for Risperidone 1mg . Sent a one month refill. Patients' next office visit is on 05/30/17. Nothing further is needed at this time.

## 2017-04-30 ENCOUNTER — Ambulatory Visit (INDEPENDENT_AMBULATORY_CARE_PROVIDER_SITE_OTHER): Payer: BLUE CROSS/BLUE SHIELD | Admitting: Osteopathic Medicine

## 2017-04-30 ENCOUNTER — Ambulatory Visit (INDEPENDENT_AMBULATORY_CARE_PROVIDER_SITE_OTHER): Payer: BLUE CROSS/BLUE SHIELD

## 2017-04-30 ENCOUNTER — Encounter: Payer: Self-pay | Admitting: Osteopathic Medicine

## 2017-04-30 VITALS — BP 133/82 | HR 63 | Temp 98.3°F | Wt 169.0 lb

## 2017-04-30 DIAGNOSIS — Z8507 Personal history of malignant neoplasm of pancreas: Secondary | ICD-10-CM | POA: Diagnosis not present

## 2017-04-30 DIAGNOSIS — R05 Cough: Secondary | ICD-10-CM

## 2017-04-30 DIAGNOSIS — R1084 Generalized abdominal pain: Secondary | ICD-10-CM | POA: Diagnosis not present

## 2017-04-30 DIAGNOSIS — G8929 Other chronic pain: Secondary | ICD-10-CM | POA: Diagnosis not present

## 2017-04-30 DIAGNOSIS — I700 Atherosclerosis of aorta: Secondary | ICD-10-CM | POA: Diagnosis not present

## 2017-04-30 DIAGNOSIS — Z9049 Acquired absence of other specified parts of digestive tract: Secondary | ICD-10-CM | POA: Diagnosis not present

## 2017-04-30 DIAGNOSIS — M546 Pain in thoracic spine: Secondary | ICD-10-CM

## 2017-04-30 DIAGNOSIS — E1165 Type 2 diabetes mellitus with hyperglycemia: Secondary | ICD-10-CM | POA: Diagnosis not present

## 2017-04-30 DIAGNOSIS — R748 Abnormal levels of other serum enzymes: Secondary | ICD-10-CM | POA: Diagnosis not present

## 2017-04-30 DIAGNOSIS — I10 Essential (primary) hypertension: Secondary | ICD-10-CM

## 2017-04-30 DIAGNOSIS — R112 Nausea with vomiting, unspecified: Secondary | ICD-10-CM | POA: Diagnosis not present

## 2017-04-30 DIAGNOSIS — K760 Fatty (change of) liver, not elsewhere classified: Secondary | ICD-10-CM

## 2017-04-30 DIAGNOSIS — M25511 Pain in right shoulder: Secondary | ICD-10-CM

## 2017-04-30 DIAGNOSIS — K439 Ventral hernia without obstruction or gangrene: Secondary | ICD-10-CM

## 2017-04-30 DIAGNOSIS — R059 Cough, unspecified: Secondary | ICD-10-CM

## 2017-04-30 LAB — COMPLETE METABOLIC PANEL WITH GFR
AG Ratio: 1.4 (calc) (ref 1.0–2.5)
ALBUMIN MSPROF: 4.5 g/dL (ref 3.6–5.1)
ALKALINE PHOSPHATASE (APISO): 114 U/L (ref 33–130)
ALT: 107 U/L — ABNORMAL HIGH (ref 6–29)
AST: 30 U/L (ref 10–35)
BILIRUBIN TOTAL: 0.9 mg/dL (ref 0.2–1.2)
BUN: 18 mg/dL (ref 7–25)
CO2: 29 mmol/L (ref 20–32)
CREATININE: 0.7 mg/dL (ref 0.50–1.05)
Calcium: 10 mg/dL (ref 8.6–10.4)
Chloride: 93 mmol/L — ABNORMAL LOW (ref 98–110)
GFR, Est African American: 115 mL/min/{1.73_m2} (ref >=60)
GFR, Est Non African American: 99 mL/min/{1.73_m2} (ref >=60)
GLOBULIN: 3.2 g/dL (ref 1.9–3.7)
Glucose, Bld: 348 mg/dL — ABNORMAL HIGH (ref 65–99)
Potassium: 4.1 mmol/L (ref 3.5–5.3)
SODIUM: 129 mmol/L — AB (ref 135–146)
Total Protein: 7.7 g/dL (ref 6.1–8.1)

## 2017-04-30 MED ORDER — ONDANSETRON 8 MG PO TBDP: 8.0000 mg | ORAL_TABLET | Freq: Three times a day (TID) | ORAL | 3 refills | Status: AC | PRN

## 2017-04-30 MED ORDER — IOPAMIDOL (ISOVUE-300) INJECTION 61%
100.0000 mL | Freq: Once | INTRAVENOUS | Status: AC | PRN
  Administered 2017-04-30: 100 mL via INTRAVENOUS

## 2017-04-30 NOTE — Patient Instructions (Addendum)
At this point, based on symptoms alone, I am not sure what may be causing your illness.   We should start by ruling out the most dangerous abdominal issues, whether complications from scar tissue/surgery or recurrence of cancer or other abdominal process. Will go ahead and at least send in some nausea medication for you.  We are going to get some blood work for further evaluation as well, plus a chest x-ray to rule out walking pneumonia or other cause of cough/back pain, plys urine test.   Will also get shoulder x-ray, recommend follow-up with sports medicine or the orthopedist you saw in [place] for this issue.  Otherwise, rest and ice the shoulder.  If symptoms worsen, especially if severe chest pain/shortness of breath, severe abdominal pain vomiting anything that looks like blood or coffee grounds, or any other concerns please seek emergency medical attention.

## 2017-04-30 NOTE — Progress Notes (Addendum)
HPI: Sara Leon is a 51 y.o. female who  has a past medical history of Anxiety, Bladder incontinence, Condyloma acuminatum of vulva, Deep venous thrombosis of upper extremity ([place]) (2011), Depression, Diabetes mellitus (10/2008), Fatty liver, GERD (gastroesophageal reflux disease), Hyperlipidemia, Hypertension, Osteoarthritis of hand, Pancreatic insufficiency (10/16/2010), Pancreatic mass (1/ 2011), Pancreatitis chronic, Routine gynecological examination, and Wears contact lenses.  she presents to [facility] today, 04/30/17,  for chief complaint of:  Chief Complaint  Patient presents with  . Night Sweats  . Back Pain  . Neck Pain    2.5-3 weeks ago, mid-back and neck and chest discomfort, most concerning symptom is bloated-type abdominal pain and tenderness w/ nausea.   Neck and back pain chronic. Headache daily for about 3 weeks, thinks coming from neck pain. Concern about "swollen lymph node" in front and back of neck. Thoracic back pain w/o ripping/sharp chest pain.    Abd pain epigastric 3 weeks ago, recently moved more to RLQ. (+)nausea is really bad without the prednisone. No diarrhea or blood in stool.   2-3 x/night getting up with night sweats, this is over the past few days. No temperature measurements at home. Took Tylenol today this morning about 1.5 hrs ago, afebrile in the office.   Coughing fits where she feels like muscle strained in her back. Nonproductive cough.   Started prednisone from orthopedist Dr Lesly Dukes in [facility], started on Friday 4 days ago, last dose supposed to be on the 23rd. When she doesn't take it, the pain returns. Nausea a bit worse when she does take the medicine.       Past medical, surgical, social and family history reviewed:  Patient Active Problem List   Diagnosis Date Noted  . Aphthous ulcer of mouth 02/21/2017  . Fall 11/15/2016  . Greater trochanteric bursitis of both hips 05/04/2016  . High risk  medication use 02/23/2016  . DJD (degenerative joint disease), ankle and foot 10/13/2015  . Right foot pain 10/03/2015  . Metatarsalgia of right foot 10/03/2015  . Lumbar radiculopathy, acute 05/30/2015  . Acute bronchitis 05/23/2015  . Solitary pulmonary nodule 03/31/2014  . S/P repair of ventral hernia 01/21/2014  . Type 2 diabetes mellitus ([place]) 08/07/2013  . Essential hypertension   . Hyperlipidemia 05/02/2011  . Depression with anxiety 05/02/2011  . Chronic pancreatitis ([place]) 10/16/2010    Class: Question of  . Pancreatic insufficiency 10/16/2010    Past Surgical History:  Procedure Laterality Date  . ABDOMINAL HYSTERECTOMY  1987   Partial due to heavy bleeding, still has her ovaries  . CHOLECYSTECTOMY  2000  . Dobbhoff feeding tube  07/04/09   [facility]  . 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 Eugenia Pancoast)  . PANCREATICODUODENECTOMY  06/10/09   [facility], Dr. Eugenia Pancoast  . TONSILLECTOMY  1971  . UPPER ENDOSCOPIC ULTRASOUND W/ FNA  05/12/2009   Uncinate process mass - Dr. Jerene Pitch  . UPPER GASTROINTESTINAL ENDOSCOPY  2005 and 2010 - [place], [place]   gastritis 2005 and 2010, retained food 2005, no H. pyloi and duodenal bxs normal    Social History   Tobacco Use  . Smoking status: Current Every Day Smoker    Packs/day: 1.00  . Smokeless tobacco: Never Used  Substance Use Topics  . Alcohol use: No    Alcohol/week: 0.0 oz    Comment: started AA 05/2012.  Last drink early January    Family History  Problem Relation Age of Onset  . Cancer Mother 31       died of melanoma  . Hypertension Father   . Heart disease Father        MI  . Hyperlipidemia Father   . Other Father        died in [place]  . Heart disease Paternal Grandfather   . Crohn's disease Unknown        nephew  . Colon cancer Maternal Grandmother   . Cancer Maternal Grandmother        colon  . Cancer Maternal Grandfather        colon     Current  medication list and allergy/intolerance information reviewed:    Current Outpatient Medications  Medication Sig Dispense Refill  . AMBULATORY NON FORMULARY MEDICATION Diabetic testing strips: OneTouch Verio: Use to check blood sugar up to three times a day. 50 Units 11  . atenolol (TENORMIN) 25 MG tablet TAKE 2 TABLETS BY MOUTH DAILY 180 tablet 0  . busPIRone (BUSPAR) 7.5 MG tablet TAKE ONE TABLET BY MOUTH 1-2 TIMES A DAY 30 tablet 2  . clonazePAM (KLONOPIN) 0.5 MG tablet TAKE 1 TABLET BY MOUTH AT BEDTIME 15 tablet 0  . clonazePAM (KLONOPIN) 0.5 MG tablet Take 1 tablet (0.5 mg total) by mouth at bedtime. 15 tablet 0  . hydrochlorothiazide (HYDRODIURIL) 12.5 MG tablet Take 1 tablet (12.5 mg total) by mouth daily. 90 tablet 3  . insulin degludec (TRESIBA FLEXTOUCH) 100 UNIT/ML SOPN FlexTouch Pen Inject 0.14 mLs (14 Units total) into the skin daily. 3 pen 12  . Insulin Pen Needle (B-D UF III MINI PEN NEEDLES) 31G X 5 MM MISC Use to inject insulin daily. 50 each 11  . metFORMIN (GLUCOPHAGE) 500 MG tablet TAKE ONE TABLET BY MOUTH TWICE A DAY WITH A MEAL 180 tablet 1  . pantoprazole (PROTONIX) 40 MG tablet Take 1 tablet (40 mg total) by mouth 2 (two) times daily before a meal. For 2 weeks, then take daily unless told otherwise by Dr. 60 tablet 4  . prazosin (MINIPRESS) 1 MG capsule Take 1 capsule (1 mg total) by mouth at bedtime. 30 capsule 2  . predniSONE (STERAPRED UNI-PAK 48 TAB) 10 MG (48) TBPK tablet   0  . risperiDONE (RISPERDAL) 1 MG tablet Take 1 tablet (1 mg total) by mouth at bedtime. 30 tablet 0  . venlafaxine XR (EFFEXOR-XR) 150 MG 24 hr capsule TAKE 2 CAPSULES BY MOUTH EVERY DAY WITH BREAKFAST 60 capsule 1   No current facility-administered medications for this visit.     Allergies  Allergen Reactions  . Montelukast Cough  . Quinapril Cough      Review of Systems:  Constitutional:  No  fever, +chills, No unintentional weight changes. +significant fatigue.   HEENT: +headache,  no vision change, no hearing change, No sore throat, No  sinus pressure  Cardiac: No  chest pain, No  pressure, No palpitations, No  Orthopnea  Respiratory:  No  shortness of breath. No  Cough  Gastrointestinal: +abdominal pain, +nausea, +vomiting,  No  blood in stool, No  diarrhea, No  constipation   Musculoskeletal: No new myalgia/arthralgia  Skin: No  Rash  Hem/Onc: No  easy bruising/bleeding, +concern for abnormal lymph node  Neurologic: No  weakness, No  dizziness  Psychiatric: No  concerns with depression, +concerns with anxiety  Exam:  BP 133/82   Pulse 63   Temp 98.3 F (36.8 C) (Oral)   Wt 169 lb (76.7  kg)   BMI 28.12 kg/m   Constitutional: VS see above. General Appearance: alert, well-developed, well-nourished, NAD  Eyes: Normal lids and conjunctive, non-icteric sclera  Ears, Nose, Mouth, Throat: MMM, Normal external inspection ears/nares/mouth/lips/gums. TM normal bilaterally. Pharynx/tonsils no erythema, no exudate. Nasal mucosa normal.   Neck: No masses, trachea midline. No thyroid enlargement. No tenderness/mass appreciated. No lymphadenopathy appreciated but (+)tender submental LN bilaterally, ?anterior to L ear vs muscle spasm mastication muscles  Respiratory: Normal respiratory effort. no wheeze, no rhonchi, no rales  Cardiovascular: S1/S2 normal, no murmur, no rub/gallop auscultated. RRR. No lower extremity edema. Pedal pulse II/IV bilaterally DP and PT. No carotid bruit or JVD. No abdominal aortic bruit.  Gastrointestinal: (+0TTP RUQ, LUQ, Epigastrum, no masses. No hepatomegaly, no splenomegaly. No hernia appreciated. Bowel sounds normal. Rectal exam deferred.   Musculoskeletal: Gait normal. No clubbing/cyanosis of digits.   Neurological: Normal balance/coordination. No tremor.   Skin: warm, dry, intact. Marland Kitchen    Psychiatric: Normal judgment/insight. Normal mood and affect. Oriented x3.     EKG interpretation: Rate: 61  Rhythm: sinus No ST/T  changes concerning for acute ischemia/infarct  Previous EKG no major changes from 10/13/2012    ASSESSMENT/PLAN:   Generalized abdominal pain - Uncertain diagnosis - hyperactive BS w/o recent BM and w/ hx extensive abd surgery d/t CA, ?obstruction vs recurrence of CA vs other abd process  - Plan: CT Abdomen Pelvis W Contrast, CBC with Differential/Platelet, TSH, Urinalysis, Routine w reflex microscopic, Urine Culture, Lipase, Amylase, Gamma GT, COMPLETE METABOLIC PANEL WITH GFR, Ambulatory referral to Gastroenterology, CANCELED: COMPLETE METABOLIC PANEL WITH GFR  Non-intractable vomiting with nausea, unspecified vomiting type - I think more likely GI process  - Plan: CT Abdomen Pelvis W Contrast, EKG 12-Lead, ondansetron (ZOFRAN-ODT) 8 MG disintegrating tablet, COMPLETE METABOLIC PANEL WITH GFR, Ambulatory referral to Gastroenterology  Essential hypertension - Plan: EKG 12-Lead, COMPLETE METABOLIC PANEL WITH GFR  Cough - Plan: DG Chest 2 View  Acute pain of right shoulder - Plan: predniSONE (STERAPRED UNI-PAK 48 TAB) 10 MG (48) TBPK tablet, DG Shoulder Right  History of pancreatic cancer - Plan: CT Abdomen Pelvis W Contrast, Lipase, Amylase, COMPLETE METABOLIC PANEL WITH GFR, Ambulatory referral to Gastroenterology, CANCELED: COMPLETE METABOLIC PANEL WITH GFR  Chronic bilateral thoracic back pain - Plan: predniSONE (STERAPRED UNI-PAK 48 TAB) 10 MG (48) TBPK tablet  Elevated serum GGT level - Plan: Ambulatory referral to Gastroenterology  Uncontrolled type 2 diabetes mellitus with hyperglycemia ([place])  S/P cholecystectomy - Plan: Ambulatory referral to Gastroenterology    Patient Instructions  At this point, based on symptoms alone, I am not sure what may be causing your illness.   We should start by ruling out the most dangerous abdominal issues, whether complications from scar tissue/surgery or recurrence of cancer or other abdominal process. Will go ahead and at least send in some  nausea medication for you.  We are going to get some blood work for further evaluation as well, plus a chest x-ray to rule out walking pneumonia or other cause of cough/back pain, plys urine test.   Will also get shoulder x-ray, recommend follow-up with sports medicine or the orthopedist you saw in [place] for this issue.  Otherwise, rest and ice the shoulder.  If symptoms worsen, especially if severe chest pain/shortness of breath, severe abdominal pain vomiting anything that looks like blood or coffee grounds, or any other concerns please seek emergency medical attention.      Results for orders placed or performed in visit  on 04/30/17 (from the past 24 hour(s))  COMPLETE METABOLIC PANEL WITH GFR     Status: Abnormal   Collection Time: 04/30/17 10:09 AM  Result Value Ref Range   Glucose, Bld 348 (H) 65 - 99 mg/dL   BUN 18 7 - 25 mg/dL   Creat 0.70 0.50 - 1.05 mg/dL   GFR, Est Non African American 99 > OR = 60 mL/min/1.50m2   GFR, Est African American 115 > OR = 60 mL/min/1.75m2   BUN/Creatinine Ratio NOT APPLICABLE 6 - 22 (calc)   Sodium 129 (L) 135 - 146 mmol/L   Potassium 4.1 3.5 - 5.3 mmol/L   Chloride 93 (L) 98 - 110 mmol/L   CO2 29 20 - 32 mmol/L   Calcium 10.0 8.6 - 10.4 mg/dL   Total Protein 7.7 6.1 - 8.1 g/dL   Albumin 4.5 3.6 - 5.1 g/dL   Globulin 3.2 1.9 - 3.7 g/dL (calc)   AG Ratio 1.4 1.0 - 2.5 (calc)   Total Bilirubin 0.9 0.2 - 1.2 mg/dL   Alkaline phosphatase (APISO) 114 33 - 130 U/L   AST 30 10 - 35 U/L   ALT 107 (H) 6 - 29 U/L  CBC with Differential/Platelet     Status: None   Collection Time: 04/30/17 10:11 AM  Result Value Ref Range   WBC 8.6 3.8 - 10.8 Thousand/uL   RBC 4.79 3.80 - 5.10 Million/uL   Hemoglobin 15.0 11.7 - 15.5 g/dL   HCT 43.9 35.0 - 45.0 %   MCV 91.6 80.0 - 100.0 fL   MCH 31.3 27.0 - 33.0 pg   MCHC 34.2 32.0 - 36.0 g/dL   RDW 13.1 11.0 - 15.0 %   Platelets 241 140 - 400 Thousand/uL   MPV 12.1 7.5 - 12.5 fL   Neutro Abs 6,115 1,500 -  7,800 cells/uL   Lymphs Abs 2,098 850 - 3,900 cells/uL   WBC mixed population 344 200 - 950 cells/uL   Eosinophils Absolute 17 15 - 500 cells/uL   Basophils Absolute 26 0 - 200 cells/uL   Neutrophils Relative % 71.1 %   Total Lymphocyte 24.4 %   Monocytes Relative 4.0 %   Eosinophils Relative 0.2 %   Basophils Relative 0.3 %  TSH     Status: None   Collection Time: 04/30/17 10:11 AM  Result Value Ref Range   TSH 1.10 mIU/L  Urinalysis, Routine w reflex microscopic     Status: Abnormal   Collection Time: 04/30/17 10:11 AM  Result Value Ref Range   Color, Urine YELLOW YELLOW   APPearance CLOUDY (A) CLEAR   Specific Gravity, Urine > OR = 1.045 (A) 1.001 - 1.03   pH 6.0 5.0 - 8.0   Glucose, UA 3+ (A) NEGATIVE   Bilirubin Urine NEGATIVE NEGATIVE   Ketones, ur TRACE (A) NEGATIVE   Hgb urine dipstick NEGATIVE NEGATIVE   Protein, ur NEGATIVE NEGATIVE   Nitrite NEGATIVE NEGATIVE   Leukocytes, UA NEGATIVE NEGATIVE  Lipase     Status: None   Collection Time: 04/30/17 10:11 AM  Result Value Ref Range   Lipase 10 7 - 60 U/L  Amylase     Status: Abnormal   Collection Time: 04/30/17 10:11 AM  Result Value Ref Range   Amylase 17 (L) 21 - 101 U/L  Gamma GT     Status: Abnormal   Collection Time: 04/30/17 10:11 AM  Result Value Ref Range   GGT 597 (H) 3 - 70 U/L  Dg Chest 2 View  Result Date: 04/30/2017 CLINICAL DATA:  Cough which began after a fall last night. Also right shoulder pain. EXAM: CHEST  2 VIEW COMPARISON:  None in PACs FINDINGS: The lungs are adequately inflated. There is no focal infiltrate. The interstitial markings are mildly prominent. The heart and pulmonary vascularity are normal. The mediastinum is normal in width. The bony thorax is unremarkable. IMPRESSION: There is no acute cardiopulmonary abnormality. There are mild chronic bronchitic-smoking related changes. Electronically Signed   By: David  Martinique M.D.   On: 04/30/2017 14:29   Dg Shoulder Right  Result  Date: 04/30/2017 CLINICAL DATA:  Right shoulder pain after falling last night. Limited range of motion. EXAM: RIGHT SHOULDER - 2+ VIEW COMPARISON:  None in PACs FINDINGS: The bones are subjectively adequately mineralized. The joint spaces are well maintained. There is mild spurring of the articular margins of the acromion at the [facility] joint. There is no acute fracture or dislocation. The soft tissues are unremarkable. IMPRESSION: There is no acute bony abnormality of the right shoulder. Mild spurring of the acromion at the level of the [facility] joint. Electronically Signed   By: David  Martinique M.D.   On: 04/30/2017 14:26   Ct Abdomen Pelvis W Contrast  Result Date: 04/30/2017 CLINICAL DATA:  Abdominal pain with nausea. History of pancreatic carcinoma EXAM: CT ABDOMEN AND PELVIS WITH CONTRAST TECHNIQUE: Multidetector CT imaging of the abdomen and pelvis was performed using the standard protocol following bolus administration of intravenous contrast. CONTRAST:  159mL ISOVUE-300 IOPAMIDOL (ISOVUE-300) INJECTION 61% COMPARISON:  November 09, 2016 FINDINGS: Lower chest: Lung bases are clear. Hepatobiliary: Liver measures 23.2 cm in length. There is hepatic steatosis throughout most of the liver. Beyond the fatty change, no focal liver lesions are evident. A small amount of air within the biliary ductal system is consistent with previous surgery involving the pancreatic head. There is no biliary duct dilatation. Gallbladder is absent. Pancreas: There has been surgical removal of the head and uncinate process of the pancreas, stable. Remainder the pancreas appears normal. There is no current pancreatic mass or pancreatic duct dilatation. No inflammatory focus evident. Spleen: No splenic lesions are evident. Adrenals/Urinary Tract: Adrenals appear normal bilaterally. There is no renal mass or hydronephrosis on either side. There is no renal or ureteral calculus on either side. Urinary bladder is midline with wall thickness within  normal limits. Stomach/Bowel: Rectum is mildly distended with air. There is no appreciable bowel wall or mesenteric thickening. No evident bowel obstruction. No free air or portal venous air. Note that a portion of the duodenum has been previously removed with patent anastomosis. Vascular/Lymphatic: There is atherosclerotic calcification in the aorta. There is also calcification in the common iliac arteries bilaterally. No aneurysm evident. Major mesenteric vessels appear patent. There is no appreciable adenopathy in the abdomen or pelvis. Reproductive: Uterus is absent.  No pelvic mass evident. Other: Appendix appears normal. No abscess or ascites evident in the abdomen or pelvis. There is a small ventral hernia containing only fat. Musculoskeletal: There is degenerative change in the lumbar spine. There are no blastic or lytic bone lesions. IMPRESSION: 1. There has been surgical removal of the head and uncinate process of the pancreas as well as a portion of the duodenum. Remaining portions of pancreas appear normal. Duodenal anastomosis is patent. No metastatic lesion appreciable in the abdomen or pelvis. 2. Prominent liver with hepatic steatosis. No focal liver lesions. Gallbladder absent. Air within the biliary ducts is postoperative in etiology.  3.  No bowel obstruction.  No abscess. 4.  No renal or ureteral calculus.  No hydronephrosis. 5.  Aortoiliac atherosclerosis. 6.  Small ventral hernia containing only fat. 7.  Uterus absent. Aortic Atherosclerosis (ICD10-I70.0). Electronically Signed   By: Lowella Grip III M.D.   On: 04/30/2017 19:53   ADDENDUM 05/01/17 9:02 AM Spoke to patient personally over the phone.  Given elevated liver enzymes, GGT, history of Whipple procedure and cholecystectomy, I suspect probably something going on with the ductal system, possible complications from surgical scar tissue/fatty liver.  Patient denies any alcohol consumption.  She notes that her sugars have been getting  a bit worse.  We will add on A1c.    Visit summary with medication list and pertinent instructions was printed for patient to review. All questions at time of visit were answered - patient instructed to contact office with any additional concerns. ER/RTC precautions were reviewed with the patient.   Follow-up plan: Return for recheck and review results in 2-3 days . - Addended - referring to GI   Note: Total time spent 40 minutes, greater than 50% of the visit was spent face-to-face counseling and coordinating care for the following: The primary encounter diagnosis was Generalized abdominal pain. Diagnoses of Non-intractable vomiting with nausea, unspecified vomiting type, Essential hypertension, Cough, Acute pain of right shoulder, History of pancreatic cancer, and Chronic bilateral thoracic back pain were also pertinent to this visit.Marland Kitchen  Please note: voice recognition software was used to produce this document, and typos may escape review. Please contact Dr. Sheppard Coil for any needed clarifications.

## 2017-05-01 ENCOUNTER — Encounter: Payer: Self-pay | Admitting: Gastroenterology

## 2017-05-01 LAB — URINE CULTURE
MICRO NUMBER: 81421880
SPECIMEN QUALITY:: ADEQUATE

## 2017-05-01 NOTE — Addendum Note (Signed)
Addended by: Maryla Morrow on: 05/01/2017 09:03 AM   Modules accepted: Orders

## 2017-05-13 ENCOUNTER — Telehealth: Payer: Self-pay

## 2017-05-13 LAB — URINALYSIS, ROUTINE W REFLEX MICROSCOPIC
Bilirubin Urine: NEGATIVE
Hgb urine dipstick: NEGATIVE
LEUKOCYTES UA: NEGATIVE
NITRITE: NEGATIVE
PROTEIN: NEGATIVE
Specific Gravity, Urine: >=1.045 — AB (ref 1.001–1.03)
pH: 6 (ref 5.0–8.0)

## 2017-05-13 LAB — CBC WITH DIFFERENTIAL/PLATELET
BASOS ABS: 26 {cells}/uL (ref 0–200)
Basophils Relative: 0.3 %
EOS PCT: 0.2 %
Eosinophils Absolute: 17 cells/uL (ref 15–500)
HEMATOCRIT: 43.9 % (ref 35.0–45.0)
HEMOGLOBIN: 15 g/dL (ref 11.7–15.5)
LYMPHS ABS: 2098 {cells}/uL (ref 850–3900)
MCH: 31.3 pg (ref 27.0–33.0)
MCHC: 34.2 g/dL (ref 32.0–36.0)
MCV: 91.6 fL (ref 80.0–100.0)
MPV: 12.1 fL (ref 7.5–12.5)
Monocytes Relative: 4 %
NEUTROS ABS: 6115 {cells}/uL (ref 1500–7800)
Neutrophils Relative %: 71.1 %
Platelets: 241 10*3/uL (ref 140–400)
RBC: 4.79 10*6/uL (ref 3.80–5.10)
RDW: 13.1 % (ref 11.0–15.0)
Total Lymphocyte: 24.4 %
WBC: 8.6 10*3/uL (ref 3.8–10.8)
WBCMIX: 344 {cells}/uL (ref 200–950)

## 2017-05-13 LAB — HEMOGLOBIN A1C W/OUT EAG

## 2017-05-13 LAB — LIPASE: Lipase: 10 U/L (ref 7–60)

## 2017-05-13 LAB — AMYLASE: Amylase: 17 U/L — ABNORMAL LOW (ref 21–101)

## 2017-05-13 LAB — GAMMA GT: GGT: 597 U/L — ABNORMAL HIGH (ref 3–70)

## 2017-05-13 LAB — TSH: TSH: 1.1 mIU/L

## 2017-05-13 NOTE — Telephone Encounter (Signed)
Patient called today stating that nausea medication is not helping. Still having nausea after taking medication. Requesting a new rx  For nausea to be sent to [facility] in [place], [place]. Please advise, thanks.

## 2017-05-13 NOTE — Telephone Encounter (Signed)
Can discontinue Phenergan and Zofran if still on list and ask patient to stop these if not helping   Can send Reglan (metoclopramide) 10 mg tab sig: 5-10 mg po tid prn nausea disp #30 refill x2 and see if this helps

## 2017-05-13 NOTE — Telephone Encounter (Signed)
Pt has been updated of provider's note and new prescription for nausea sent to local pharmacy. No other inquiries asked during phone call.

## 2017-05-15 NOTE — Telephone Encounter (Signed)
Open in error

## 2017-05-17 ENCOUNTER — Ambulatory Visit (INDEPENDENT_AMBULATORY_CARE_PROVIDER_SITE_OTHER): Payer: BLUE CROSS/BLUE SHIELD | Admitting: Gastroenterology

## 2017-05-17 ENCOUNTER — Encounter (INDEPENDENT_AMBULATORY_CARE_PROVIDER_SITE_OTHER): Payer: Self-pay

## 2017-05-17 ENCOUNTER — Other Ambulatory Visit (INDEPENDENT_AMBULATORY_CARE_PROVIDER_SITE_OTHER): Payer: BLUE CROSS/BLUE SHIELD

## 2017-05-17 ENCOUNTER — Encounter: Payer: Self-pay | Admitting: Gastroenterology

## 2017-05-17 VITALS — BP 128/80 | HR 70 | Resp 94 | Ht 65.0 in | Wt 169.2 lb

## 2017-05-17 DIAGNOSIS — R110 Nausea: Secondary | ICD-10-CM

## 2017-05-17 DIAGNOSIS — F17200 Nicotine dependence, unspecified, uncomplicated: Secondary | ICD-10-CM

## 2017-05-17 DIAGNOSIS — R1011 Right upper quadrant pain: Secondary | ICD-10-CM

## 2017-05-17 DIAGNOSIS — R634 Abnormal weight loss: Secondary | ICD-10-CM

## 2017-05-17 DIAGNOSIS — R599 Enlarged lymph nodes, unspecified: Secondary | ICD-10-CM

## 2017-05-17 LAB — COMPREHENSIVE METABOLIC PANEL
ALBUMIN: 3.9 g/dL (ref 3.5–5.2)
ALT: 67 U/L — AB (ref 0–35)
AST: 26 U/L (ref 0–37)
Alkaline Phosphatase: 135 U/L — ABNORMAL HIGH (ref 39–117)
BILIRUBIN TOTAL: 0.6 mg/dL (ref 0.2–1.2)
BUN: 14 mg/dL (ref 6–23)
CALCIUM: 9 mg/dL (ref 8.4–10.5)
CHLORIDE: 98 meq/L (ref 96–112)
CO2: 26 meq/L (ref 19–32)
Creatinine, Ser: 0.67 mg/dL (ref 0.40–1.20)
GFR: 97.76 mL/min (ref >60.00)
Glucose, Bld: 215 mg/dL — ABNORMAL HIGH (ref 70–99)
Potassium: 4.1 mEq/L (ref 3.5–5.1)
SODIUM: 132 meq/L — AB (ref 135–145)
Total Protein: 6.8 g/dL (ref 6.0–8.3)

## 2017-05-17 MED ORDER — PANTOPRAZOLE SODIUM 40 MG PO TBEC: 40.0000 mg | DELAYED_RELEASE_TABLET | Freq: Two times a day (BID) | ORAL | 2 refills | Status: AC

## 2017-05-17 MED ORDER — PROMETHAZINE HCL 12.5 MG PO TABS: 12.5000 mg | ORAL_TABLET | Freq: Three times a day (TID) | ORAL | 1 refills | Status: AC | PRN

## 2017-05-17 MED ORDER — ONDANSETRON 4 MG PO TBDP: 4.0000 mg | ORAL_TABLET | Freq: Three times a day (TID) | ORAL | 1 refills | Status: AC

## 2017-05-17 NOTE — Patient Instructions (Addendum)
If you are age 54 or older, your body mass index should be between 23-30. Your Body mass index is 28.16 kg/m. If this is out of the aforementioned range listed, please consider follow up with your Primary Care Provider.  If you are age 14 or younger, your body mass index should be between 19-25. Your Body mass index is 28.16 kg/m. If this is out of the aformentioned range listed, please consider follow up with your Primary Care Provider.   We have sent the following medications to your pharmacy for you to pick up at your convenience:  Zofran  Phenergan  Pantoprazole- printed  You have been scheduled for an endoscopy. Please follow written instructions given to you at your visit today. If you use inhalers (even only as needed), please bring them with you on the day of your procedure. Your physician has requested that you go to www.[email or web address].com and enter the access code given to you at your visit today. This web site gives a general overview about your procedure. However, you should still follow specific instructions given to you by our office regarding your preparation for the procedure.  You have been scheduled for a CT scan of the abdomen and pelvis at [facility] ([address]---this is in the same building as Press photographer).   You are scheduled on January 10th at 8:30am. You should arrive 15 minutes prior to your appointment time for registration. Please follow the written instructions below on the day of your exam:  WARNING: IF YOU ARE ALLERGIC TO IODINE/X-RAY DYE, PLEASE NOTIFY RADIOLOGY IMMEDIATELY AT [phone]! YOU WILL BE GIVEN A 13 HOUR PREMEDICATION PREP.  1) Do not eat or drink anything after 4:30am (4 hours prior to your test)  You may take any medications as prescribed with a small amount of water except for the following: Metformin, Glucophage, Glucovance, Avandamet, Riomet, Fortamet, Actoplus Met, Janumet, Glumetza or Metaglip. The above medications  must be held the day of the exam AND 48 hours after the exam.  The purpose of you drinking the oral contrast is to aid in the visualization of your intestinal tract. The contrast solution may cause some diarrhea. Before your exam is started, you will be given a small amount of fluid to drink. Depending on your individual set of symptoms, you may also receive an intravenous injection of x-ray contrast/dye. Plan on being at [facility] for 30 minutes or longer, depending on the type of exam you are having performed.  This test typically takes 30-45 minutes to complete.  If you have any questions regarding your exam or if you need to reschedule, you may call the CT department at [phone] between the hours of 8:00 am and 5:00 pm, Monday-Friday.  ________________________________________________________________________    Thank you.

## 2017-05-17 NOTE — Progress Notes (Addendum)
05/17/2017 Sara Leon [id number] 05/10/1966   HISTORY OF PRESENT ILLNESS:  This is a 52 year old female who is a patient of Dr. Celesta Aver.  She has a history of pancreatic intraepithelial neoplasia status post resection in 05/2009 at [facility] by Dr. Eugenia Pancoast.  Also status post cholecystectomy.  She presents to our office today at the request of her PCP, Dr. Sheppard Coil for complaints of moderate to severe right upper quadrant abdominal pain with severe nausea for the past 5 weeks.  Says that the nausea is terrible.  Zofran does not really help much.  Recent CT scan of the abdomen and pelvis with contrast did not show any cause of her symptoms.  Results as follows:    IMPRESSION: 1. There has been surgical removal of the head and uncinate process of the pancreas as well as a portion of the duodenum. Remaining portions of pancreas appear normal. Duodenal anastomosis is patent. No metastatic lesion appreciable in the abdomen or pelvis.  2. Prominent liver with hepatic steatosis. No focal liver lesions. Gallbladder absent. Air within the biliary ducts is postoperative in etiology.  3.  No bowel obstruction.  No abscess.  4.  No renal or ureteral calculus.  No hydronephrosis.  5.  Aortoiliac atherosclerosis.  6.  Small ventral hernia containing only fat.  7.  Uterus absent.  CBC, CMP, lipase, amylase, and TSH were unremarkable except for elevated blood sugar, low sodium at 129, and elevated ALT at 107.  Total bili, AST, and ALP were unremarkable.  She has been on pantoprazole 40 mg daily for quite a while, but PCP had increased it to BID for a couple of weeks, however, is now back to once daily.  Also complains of painful swollen glands in her neck that began around the same time as all of these other symptoms.   Past Medical History:  Diagnosis Date  . Anxiety   . Bladder incontinence    Urology, pending studies 05/2012  . Cancer [facility])    Pancreatic  . Condyloma  acuminatum of vulva   . Deep venous thrombosis of upper extremity ([place]) 2011   due to PICC  . Depression   . Diabetes mellitus 10/2008   diet controlled  . Fatty liver    appears improved on imaging after weight loss  . GERD (gastroesophageal reflux disease)   . Hyperlipidemia   . Hypertension   . Osteoarthritis of hand    bilat 2nd and 3rd fingers at [facility], PIP  . Pancreatic insufficiency 10/16/2010  . 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 Pancreatitis chronic    on resection specimen  . Routine gynecological examination    Dr. Elonda Husky, [facility]na Hoff  . Wears contact lenses    Past Surgical History:  Procedure Laterality Date  . ABDOMINAL HYSTERECTOMY  1987   Partial due to heavy bleeding, still has her ovaries  . CHOLECYSTECTOMY  2000  . Dobbhoff feeding tube  07/04/09   [facility]  . 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 Eugenia Pancoast)  . PANCREATICODUODENECTOMY  06/10/09   [facility], Dr. Eugenia Pancoast  . TONSILLECTOMY  1971  . UPPER ENDOSCOPIC ULTRASOUND W/ FNA  05/12/2009   Uncinate process mass - Dr. Jerene Pitch  . UPPER GASTROINTESTINAL ENDOSCOPY  2005 and 2010 - [place], [place]   gastritis 2005 and 2010, retained food 2005, no H. pyloi and duodenal bxs normal    reports that she has  been smoking.  She has been smoking about 1.00 pack per day. she has never used smokeless tobacco. She reports that she does not drink alcohol or use drugs. family history includes Cancer in her maternal grandfather and maternal grandmother; Cancer (age of onset: 87) in her mother; Colon cancer in her maternal grandmother; Crohn's disease in her unknown relative; Heart disease in her father and paternal grandfather; Hyperlipidemia in her father; Hypertension in her father; Other in her father. Allergies  Allergen Reactions  . Montelukast Cough  . Quinapril Cough      Outpatient Encounter Medications as of  05/17/2017  Medication Sig  . AMBULATORY NON FORMULARY MEDICATION Diabetic testing strips: OneTouch Verio: Use to check blood sugar up to three times a day.  Marland Kitchen atenolol (TENORMIN) 25 MG tablet TAKE 2 TABLETS BY MOUTH DAILY  . busPIRone (BUSPAR) 7.5 MG tablet TAKE ONE TABLET BY MOUTH 1-2 TIMES A DAY  . clonazePAM (KLONOPIN) 0.5 MG tablet TAKE 1 TABLET BY MOUTH AT BEDTIME  . clonazePAM (KLONOPIN) 0.5 MG tablet Take 1 tablet (0.5 mg total) by mouth at bedtime.  . hydrochlorothiazide (HYDRODIURIL) 12.5 MG tablet Take 1 tablet (12.5 mg total) by mouth daily.  . insulin degludec (TRESIBA FLEXTOUCH) 100 UNIT/ML SOPN FlexTouch Pen Inject 0.14 mLs (14 Units total) into the skin daily.  . Insulin Pen Needle (B-D UF III MINI PEN NEEDLES) 31G X 5 MM MISC Use to inject insulin daily.  . metFORMIN (GLUCOPHAGE) 500 MG tablet TAKE ONE TABLET BY MOUTH TWICE A DAY WITH A MEAL  . ondansetron (ZOFRAN-ODT) 8 MG disintegrating tablet Take 1 tablet (8 mg total) by mouth every 8 (eight) hours as needed for nausea or vomiting.  . pantoprazole (PROTONIX) 40 MG tablet Take 1 tablet (40 mg total) by mouth 2 (two) times daily before a meal. For 2 weeks, then take daily unless told otherwise by Dr.  . prazosin (MINIPRESS) 1 MG capsule Take 1 capsule (1 mg total) by mouth at bedtime.  . risperiDONE (RISPERDAL) 1 MG tablet Take 1 tablet (1 mg total) by mouth at bedtime.  Marland Kitchen venlafaxine XR (EFFEXOR-XR) 150 MG 24 hr capsule TAKE 2 CAPSULES BY MOUTH EVERY DAY WITH BREAKFAST  . [DISCONTINUED] predniSONE (STERAPRED UNI-PAK 48 TAB) 10 MG (48) TBPK tablet    No facility-administered encounter medications on file as of 05/17/2017.      REVIEW OF SYSTEMS  : All other systems reviewed and negative except where noted in the History of Present Illness.   PHYSICAL EXAM: BP 128/80   Pulse 70   Resp (!) 94   Ht 5\' 5"  (1.651 m)   Wt 169 lb 3.2 oz (76.7 kg)   BMI 28.16 kg/m  General: Well developed white female in no acute distress, but  appears slightly uncomfortable. Head: Normocephalic and atraumatic Eyes:  Sclerae anicteric, conjunctiva pink. Ears: Normal auditory acuity Neck:  Swelling, fullness, and tenderness in neck and over her face/cheeks B/L. Lungs: Clear throughout to auscultation; no increased WOB. Heart: Regular rate and rhythm; no M/R/G. Abdomen: Soft, non-distended.  BS present.  RUQ and epigastric TTP. Musculoskeletal: Symmetrical with no gross deformities  Skin: No lesions on visible extremities Extremities: No edema  Neurological: Alert oriented x 4, grossly non-focal Psychological:  Alert and cooperative. Normal mood and affect  ASSESSMENT AND PLAN: *53 year old female with history of pancreatic intraepithelial neoplasia status post resection in 05/2009 at [facility] by Dr. Eugenia Pancoast.  Also status post cholecystectomy.  Presented today with complaints of moderate to severe  right upper quadrant abdominal pain with severe nausea for the past 5 weeks.  Recent CT scan did not show any cause of her symptoms.  ALT was elevated, but other LFT's normal so I think that this could be from her hepatic steatosis.  I am unsure of the cause of her symptoms at this time.  I going to have her increase her pantoprazole to twice daily again for now.  CT scan shows that her duodenal anastomosis is patent, but we will schedule for EGD to rule out ulcer at the anastomosis, etc.  We will give her some Phenergan to use as needed at nighttime and will refill her Zofran to use during the day.  Will repeat LFT's today. *Painful swollen glands in her neck: I am unsure of the cause of this and I really do not think it is related to her GI issues, but it started around the same time.  I will order a CT scan of her neck/soft tissues, but other evaluation would be deferred to her PCP.   CC:  Emeterio Reeve, DO  Agree with Sara Leon management.  Gatha Mayer, MD, [facility]

## 2017-05-18 ENCOUNTER — Other Ambulatory Visit: Payer: Self-pay | Admitting: Osteopathic Medicine

## 2017-05-20 ENCOUNTER — Telehealth: Payer: Self-pay | Admitting: Internal Medicine

## 2017-05-20 NOTE — Telephone Encounter (Signed)
The CT scan has been cancelled per pt request.  She was advised to call back if symptoms return

## 2017-05-23 ENCOUNTER — Encounter: Payer: Self-pay | Admitting: Gastroenterology

## 2017-05-23 ENCOUNTER — Ambulatory Visit (HOSPITAL_COMMUNITY): Payer: BLUE CROSS/BLUE SHIELD

## 2017-05-23 DIAGNOSIS — F17200 Nicotine dependence, unspecified, uncomplicated: Secondary | ICD-10-CM | POA: Insufficient documentation

## 2017-05-23 DIAGNOSIS — R110 Nausea: Secondary | ICD-10-CM | POA: Insufficient documentation

## 2017-05-23 DIAGNOSIS — R634 Abnormal weight loss: Secondary | ICD-10-CM | POA: Insufficient documentation

## 2017-05-23 DIAGNOSIS — R599 Enlarged lymph nodes, unspecified: Secondary | ICD-10-CM | POA: Insufficient documentation

## 2017-05-23 DIAGNOSIS — R1011 Right upper quadrant pain: Secondary | ICD-10-CM | POA: Insufficient documentation

## 2017-05-24 ENCOUNTER — Encounter: Payer: Self-pay | Admitting: Internal Medicine

## 2017-05-26 ENCOUNTER — Other Ambulatory Visit (HOSPITAL_COMMUNITY): Payer: Self-pay | Admitting: Psychiatry

## 2017-05-26 DIAGNOSIS — F418 Other specified anxiety disorders: Secondary | ICD-10-CM

## 2017-05-30 ENCOUNTER — Ambulatory Visit (HOSPITAL_COMMUNITY): Payer: Self-pay | Admitting: Psychiatry

## 2017-05-31 ENCOUNTER — Ambulatory Visit (INDEPENDENT_AMBULATORY_CARE_PROVIDER_SITE_OTHER): Payer: BLUE CROSS/BLUE SHIELD | Admitting: Psychiatry

## 2017-05-31 ENCOUNTER — Encounter (HOSPITAL_COMMUNITY): Payer: Self-pay | Admitting: Psychiatry

## 2017-05-31 VITALS — BP 126/88 | HR 74 | Ht 66.0 in | Wt 167.0 lb

## 2017-05-31 DIAGNOSIS — F4310 Post-traumatic stress disorder, unspecified: Secondary | ICD-10-CM | POA: Diagnosis not present

## 2017-05-31 DIAGNOSIS — F1390 Sedative, hypnotic, or anxiolytic use, unspecified, uncomplicated: Secondary | ICD-10-CM | POA: Diagnosis not present

## 2017-05-31 DIAGNOSIS — Z79899 Other long term (current) drug therapy: Secondary | ICD-10-CM

## 2017-05-31 DIAGNOSIS — F411 Generalized anxiety disorder: Secondary | ICD-10-CM | POA: Diagnosis not present

## 2017-05-31 DIAGNOSIS — F331 Major depressive disorder, recurrent, moderate: Secondary | ICD-10-CM

## 2017-05-31 DIAGNOSIS — Z0289 Encounter for other administrative examinations: Secondary | ICD-10-CM

## 2017-05-31 DIAGNOSIS — F410 Panic disorder [episodic paroxysmal anxiety] without agoraphobia: Secondary | ICD-10-CM | POA: Diagnosis not present

## 2017-05-31 DIAGNOSIS — F418 Other specified anxiety disorders: Secondary | ICD-10-CM

## 2017-05-31 DIAGNOSIS — F17210 Nicotine dependence, cigarettes, uncomplicated: Secondary | ICD-10-CM

## 2017-05-31 MED ORDER — VENLAFAXINE HCL ER 150 MG PO CP24: ORAL_CAPSULE | ORAL | 3 refills | Status: DC

## 2017-05-31 MED ORDER — CLONAZEPAM 0.5 MG PO TABS: 0.5000 mg | ORAL_TABLET | Freq: Every day | ORAL | 0 refills | Status: DC

## 2017-05-31 MED ORDER — RISPERIDONE 1 MG PO TABS: 1.0000 mg | ORAL_TABLET | Freq: Every day | ORAL | 2 refills | Status: DC
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
## 2017-06-18 ENCOUNTER — Other Ambulatory Visit: Payer: Self-pay | Admitting: Osteopathic Medicine

## 2017-06-18 ENCOUNTER — Other Ambulatory Visit: Payer: Self-pay

## 2017-07-20 ENCOUNTER — Other Ambulatory Visit: Payer: Self-pay | Admitting: Osteopathic Medicine

## 2017-07-31 ENCOUNTER — Other Ambulatory Visit (HOSPITAL_COMMUNITY): Payer: Self-pay | Admitting: Psychiatry

## 2017-07-31 DIAGNOSIS — F418 Other specified anxiety disorders: Secondary | ICD-10-CM

## 2017-07-31 NOTE — Telephone Encounter (Signed)
Pt needs refill on Klonopin sent to [facility] [place].  She also wants to go back to 30 instead of 45.

## 2017-08-01 MED ORDER — CLONAZEPAM 0.5 MG PO TABS: 0.5000 mg | ORAL_TABLET | Freq: Every day | ORAL | 0 refills | Status: DC

## 2017-08-01 NOTE — Telephone Encounter (Signed)
Prescription sent to pharmacy.

## 2017-08-01 NOTE — Telephone Encounter (Signed)
Tried to call and inform patient of refill but phone number was disconnected or changed.

## 2017-08-02 ENCOUNTER — Other Ambulatory Visit: Payer: Self-pay | Admitting: Osteopathic Medicine

## 2017-08-02 DIAGNOSIS — E118 Type 2 diabetes mellitus with unspecified complications: Secondary | ICD-10-CM

## 2017-08-15 ENCOUNTER — Other Ambulatory Visit: Payer: Self-pay | Admitting: Osteopathic Medicine

## 2017-08-15 NOTE — Telephone Encounter (Signed)
Pt needs further appointment for further refills. W.Journie Howson, CMA

## 2017-09-16 ENCOUNTER — Telehealth (HOSPITAL_COMMUNITY): Payer: Self-pay | Admitting: Psychiatry

## 2017-09-16 DIAGNOSIS — F418 Other specified anxiety disorders: Secondary | ICD-10-CM

## 2017-09-16 MED ORDER — CLONAZEPAM 0.5 MG PO TABS: 0.5000 mg | ORAL_TABLET | Freq: Every day | ORAL | 0 refills | Status: DC

## 2017-09-16 NOTE — Telephone Encounter (Signed)
sent 

## 2017-09-16 NOTE — Telephone Encounter (Signed)
Pt needs refill on klonopin sent to [facility] in [place]

## 2017-09-24 ENCOUNTER — Other Ambulatory Visit: Payer: Self-pay

## 2017-09-24 ENCOUNTER — Ambulatory Visit (INDEPENDENT_AMBULATORY_CARE_PROVIDER_SITE_OTHER): Payer: 59 | Admitting: Psychiatry

## 2017-09-24 ENCOUNTER — Encounter (HOSPITAL_COMMUNITY): Payer: Self-pay | Admitting: Psychiatry

## 2017-09-24 ENCOUNTER — Other Ambulatory Visit (HOSPITAL_COMMUNITY): Payer: Self-pay | Admitting: Psychiatry

## 2017-09-24 VITALS — BP 124/86 | HR 74 | Ht 65.0 in | Wt 172.0 lb

## 2017-09-24 DIAGNOSIS — Z87898 Personal history of other specified conditions: Secondary | ICD-10-CM

## 2017-09-24 DIAGNOSIS — F4310 Post-traumatic stress disorder, unspecified: Secondary | ICD-10-CM | POA: Diagnosis not present

## 2017-09-24 DIAGNOSIS — F418 Other specified anxiety disorders: Secondary | ICD-10-CM | POA: Diagnosis not present

## 2017-09-24 DIAGNOSIS — F331 Major depressive disorder, recurrent, moderate: Secondary | ICD-10-CM

## 2017-09-24 DIAGNOSIS — F17210 Nicotine dependence, cigarettes, uncomplicated: Secondary | ICD-10-CM

## 2017-09-24 DIAGNOSIS — F410 Panic disorder [episodic paroxysmal anxiety] without agoraphobia: Secondary | ICD-10-CM | POA: Diagnosis not present

## 2017-09-24 DIAGNOSIS — Z8507 Personal history of malignant neoplasm of pancreas: Secondary | ICD-10-CM

## 2017-09-24 DIAGNOSIS — F411 Generalized anxiety disorder: Secondary | ICD-10-CM

## 2017-09-24 MED ORDER — PRAZOSIN HCL 1 MG PO CAPS: 1.0000 mg | ORAL_CAPSULE | Freq: Every day | ORAL | 3 refills | Status: DC

## 2017-09-24 MED ORDER — VENLAFAXINE HCL ER 150 MG PO CP24: ORAL_CAPSULE | ORAL | 3 refills | Status: DC

## 2017-09-24 MED ORDER — RISPERIDONE 1 MG PO TABS: 1.0000 mg | ORAL_TABLET | Freq: Every day | ORAL | 3 refills | Status: AC
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
## 2017-10-12 ENCOUNTER — Other Ambulatory Visit: Payer: Self-pay | Admitting: Osteopathic Medicine

## 2017-10-17 ENCOUNTER — Other Ambulatory Visit (HOSPITAL_COMMUNITY): Payer: Self-pay | Admitting: Psychiatry

## 2017-10-17 DIAGNOSIS — F418 Other specified anxiety disorders: Secondary | ICD-10-CM

## 2017-10-18 ENCOUNTER — Other Ambulatory Visit (HOSPITAL_COMMUNITY): Payer: Self-pay

## 2017-10-18 DIAGNOSIS — F418 Other specified anxiety disorders: Secondary | ICD-10-CM

## 2017-10-18 MED ORDER — CLONAZEPAM 0.5 MG PO TABS: 0.5000 mg | ORAL_TABLET | Freq: Every day | ORAL | 0 refills | Status: DC

## 2017-10-18 NOTE — Progress Notes (Signed)
Patient called requesting refill on Clonazepam 0.5mg . Sent in a one month supply to [facility] pharmacy per Dr. De Nurse.

## 2017-11-28 ENCOUNTER — Other Ambulatory Visit (HOSPITAL_COMMUNITY): Payer: Self-pay

## 2017-11-28 ENCOUNTER — Telehealth (HOSPITAL_COMMUNITY): Payer: Self-pay

## 2017-11-28 DIAGNOSIS — F418 Other specified anxiety disorders: Secondary | ICD-10-CM

## 2017-11-28 MED ORDER — CLONAZEPAM 0.5 MG PO TABS: 0.5000 mg | ORAL_TABLET | Freq: Every day | ORAL | 0 refills | Status: AC

## 2017-11-28 MED ORDER — VENLAFAXINE HCL ER 150 MG PO CP24: ORAL_CAPSULE | ORAL | 0 refills | Status: DC

## 2017-11-28 NOTE — Telephone Encounter (Signed)
Patient called requesting a refill on Clonazepam 0.5mg . I called medication in to pharmacy per Dr. De Nurse. Left vm informing patient of refill sent.

## 2017-11-28 NOTE — Telephone Encounter (Signed)
Pharmacy called stating they never received a refill for venlafaxine on 10/17/17. Resent the medication per Dr. De Nurse.

## 2017-12-18 ENCOUNTER — Other Ambulatory Visit: Payer: Self-pay | Admitting: Osteopathic Medicine

## 2017-12-24 ENCOUNTER — Other Ambulatory Visit (HOSPITAL_COMMUNITY): Payer: Self-pay | Admitting: Psychiatry

## 2017-12-24 DIAGNOSIS — F418 Other specified anxiety disorders: Secondary | ICD-10-CM

## 2017-12-29 ENCOUNTER — Other Ambulatory Visit: Payer: Self-pay | Admitting: Osteopathic Medicine

## 2018-02-14 ENCOUNTER — Ambulatory Visit (HOSPITAL_COMMUNITY): Payer: Self-pay | Admitting: Psychiatry

## 2018-02-18 ENCOUNTER — Other Ambulatory Visit (HOSPITAL_COMMUNITY): Payer: Self-pay | Admitting: Psychiatry

## 2018-02-18 DIAGNOSIS — F418 Other specified anxiety disorders: Secondary | ICD-10-CM
